# Patient Record
Sex: Male | Born: 1970
Health system: Southern US, Community
[De-identification: ages and names within clinical notes are randomized; demographics above are authoritative.]

## PROBLEM LIST (undated history)

## (undated) DIAGNOSIS — N281 Cyst of kidney, acquired: Secondary | ICD-10-CM

## (undated) DIAGNOSIS — IMO0001 Reserved for inherently not codable concepts without codable children: Secondary | ICD-10-CM

## (undated) DIAGNOSIS — N209 Urinary calculus, unspecified: Secondary | ICD-10-CM

## (undated) DIAGNOSIS — E785 Hyperlipidemia, unspecified: Secondary | ICD-10-CM

## (undated) DIAGNOSIS — K219 Gastro-esophageal reflux disease without esophagitis: Secondary | ICD-10-CM

## (undated) HISTORY — PX: COLONOSCOPY: SHX174

## (undated) HISTORY — DX: Hyperlipidemia, unspecified: E78.5

## (undated) HISTORY — DX: Cyst of kidney, acquired: N28.1

## (undated) HISTORY — PX: UPPER GASTROINTESTINAL ENDOSCOPY: SHX188

## (undated) HISTORY — DX: Urinary calculus, unspecified: N20.9

## (undated) HISTORY — DX: Reserved for inherently not codable concepts without codable children: IMO0001

## (undated) HISTORY — DX: Gastro-esophageal reflux disease without esophagitis: K21.9

---

## 2002-05-12 DIAGNOSIS — I10 Essential (primary) hypertension: Secondary | ICD-10-CM | POA: Insufficient documentation

## 2002-05-12 DIAGNOSIS — E1165 Type 2 diabetes mellitus with hyperglycemia: Secondary | ICD-10-CM

## 2006-09-15 ENCOUNTER — Ambulatory Visit: Payer: Self-pay | Admitting: Internal Medicine

## 2006-09-15 LAB — CONVERTED CEMR LAB
Albumin: 3.9 g/dL (ref 3.5–5.2)
Basophils Absolute: 0.1 10*3/uL (ref 0.0–0.1)
Chloride: 104 meq/L (ref 96–112)
Creatinine,U: 25 mg/dL
Eosinophils Absolute: 0.2 10*3/uL (ref 0.0–0.6)
GFR calc Af Amer: 141 mL/min
GFR calc non Af Amer: 117 mL/min
HCT: 43.8 % (ref 39.0–52.0)
Hgb A1c MFr Bld: 6.9 % — ABNORMAL HIGH (ref 4.6–6.0)
MCHC: 34.6 g/dL (ref 30.0–36.0)
MCV: 86.7 fL (ref 78.0–100.0)
Microalb Creat Ratio: 80 mg/g — ABNORMAL HIGH (ref 0.0–30.0)
Microalb, Ur: 2 mg/dL — ABNORMAL HIGH (ref 0.0–1.9)
Monocytes Absolute: 0.6 10*3/uL (ref 0.2–0.7)
Neutrophils Relative %: 50.7 % (ref 43.0–77.0)
Potassium: 4.5 meq/L (ref 3.5–5.1)
RBC: 5.06 M/uL (ref 4.22–5.81)
Sodium: 141 meq/L (ref 135–145)

## 2006-09-17 DIAGNOSIS — E785 Hyperlipidemia, unspecified: Secondary | ICD-10-CM | POA: Insufficient documentation

## 2006-10-06 ENCOUNTER — Encounter: Payer: Self-pay | Admitting: Internal Medicine

## 2006-10-06 ENCOUNTER — Ambulatory Visit: Payer: Self-pay | Admitting: Internal Medicine

## 2006-10-08 ENCOUNTER — Encounter: Payer: Self-pay | Admitting: Internal Medicine

## 2006-10-08 ENCOUNTER — Ambulatory Visit: Payer: Self-pay

## 2006-10-14 ENCOUNTER — Encounter (INDEPENDENT_AMBULATORY_CARE_PROVIDER_SITE_OTHER): Payer: Self-pay | Admitting: *Deleted

## 2007-01-04 ENCOUNTER — Encounter (INDEPENDENT_AMBULATORY_CARE_PROVIDER_SITE_OTHER): Payer: Self-pay | Admitting: *Deleted

## 2007-07-29 ENCOUNTER — Ambulatory Visit: Payer: Self-pay | Admitting: Internal Medicine

## 2007-08-02 LAB — CONVERTED CEMR LAB
Chloride: 103 meq/L (ref 96–112)
Cholesterol: 219 mg/dL (ref 0–200)
Creatinine, Ser: 0.9 mg/dL (ref 0.4–1.5)
Direct LDL: 162.6 mg/dL
Glucose, Bld: 118 mg/dL — ABNORMAL HIGH (ref 70–99)
Hgb A1c MFr Bld: 6.4 % — ABNORMAL HIGH (ref 4.6–6.0)
Microalb Creat Ratio: 12.2 mg/g (ref 0.0–30.0)
Sodium: 138 meq/L (ref 135–145)

## 2007-11-25 ENCOUNTER — Encounter (INDEPENDENT_AMBULATORY_CARE_PROVIDER_SITE_OTHER): Payer: Self-pay | Admitting: *Deleted

## 2009-11-05 ENCOUNTER — Ambulatory Visit: Payer: Self-pay | Admitting: Internal Medicine

## 2009-11-08 ENCOUNTER — Ambulatory Visit: Payer: Self-pay | Admitting: Internal Medicine

## 2009-11-08 LAB — CONVERTED CEMR LAB
AST: 33 units/L (ref 0–37)
Basophils Absolute: 0 10*3/uL (ref 0.0–0.1)
CO2: 29 meq/L (ref 19–32)
Calcium: 9.1 mg/dL (ref 8.4–10.5)
Chloride: 100 meq/L (ref 96–112)
Cholesterol: 258 mg/dL — ABNORMAL HIGH (ref 0–200)
Creatinine,U: 190.8 mg/dL
Direct LDL: 175.8 mg/dL
Eosinophils Relative: 2.5 % (ref 0.0–5.0)
Glucose, Bld: 144 mg/dL — ABNORMAL HIGH (ref 70–99)
HCT: 42 % (ref 39.0–52.0)
Hgb A1c MFr Bld: 8.2 % — ABNORMAL HIGH (ref 4.6–6.5)
Lymphocytes Relative: 39.2 % (ref 12.0–46.0)
Lymphs Abs: 2.6 10*3/uL (ref 0.7–4.0)
Microalb Creat Ratio: 1.9 mg/g (ref 0.0–30.0)
Monocytes Relative: 8.1 % (ref 3.0–12.0)
Platelets: 283 10*3/uL (ref 150.0–400.0)
Potassium: 4.4 meq/L (ref 3.5–5.1)
Sodium: 137 meq/L (ref 135–145)
TSH: 3.36 microintl units/mL (ref 0.35–5.50)
Triglycerides: 182 mg/dL — ABNORMAL HIGH (ref 0.0–149.0)
WBC: 6.6 10*3/uL (ref 4.5–10.5)

## 2009-11-09 ENCOUNTER — Telehealth: Payer: Self-pay | Admitting: Internal Medicine

## 2010-01-10 ENCOUNTER — Telehealth (INDEPENDENT_AMBULATORY_CARE_PROVIDER_SITE_OTHER): Payer: Self-pay | Admitting: *Deleted

## 2010-01-17 ENCOUNTER — Ambulatory Visit: Payer: Self-pay | Admitting: Internal Medicine

## 2010-01-23 LAB — CONVERTED CEMR LAB
ALT: 38 units/L (ref 0–53)
AST: 32 units/L (ref 0–37)
Direct LDL: 116.5 mg/dL
Hgb A1c MFr Bld: 7.5 % — ABNORMAL HIGH (ref 4.6–6.5)
Total CK: 152 units/L (ref 7–232)

## 2010-05-28 ENCOUNTER — Ambulatory Visit
Admission: RE | Admit: 2010-05-28 | Discharge: 2010-05-28 | Payer: Self-pay | Source: Home / Self Care | Attending: Internal Medicine | Admitting: Internal Medicine

## 2010-06-11 ENCOUNTER — Other Ambulatory Visit: Payer: Self-pay | Admitting: Internal Medicine

## 2010-06-11 ENCOUNTER — Ambulatory Visit
Admission: RE | Admit: 2010-06-11 | Discharge: 2010-06-11 | Payer: Self-pay | Source: Home / Self Care | Attending: Internal Medicine | Admitting: Internal Medicine

## 2010-06-11 DIAGNOSIS — K1120 Sialoadenitis, unspecified: Secondary | ICD-10-CM | POA: Insufficient documentation

## 2010-06-11 LAB — BASIC METABOLIC PANEL
BUN: 11 mg/dL (ref 6–23)
Chloride: 102 mEq/L (ref 96–112)
GFR: 140.04 mL/min (ref >60.00)
Potassium: 4.7 mEq/L (ref 3.5–5.1)
Sodium: 139 mEq/L (ref 135–145)

## 2010-06-11 LAB — ALT: ALT: 38 U/L (ref 0–53)

## 2010-06-11 LAB — HEMOGLOBIN A1C: Hgb A1c MFr Bld: 7.1 % — ABNORMAL HIGH (ref 4.6–6.5)

## 2010-06-11 LAB — AST: AST: 31 U/L (ref 0–37)

## 2010-06-11 NOTE — Progress Notes (Signed)
Summary: laboratory results(lmom 7/1)  Phone Note Outgoing Call   Summary of Call: otherwise patient -diabetes is not well controlled, start metformin 850 mg one a day for the first week, then increase to one twice a day -his cholesterol is elevated, recommend simvastatin 20 mg one p.o. q.h.s. -return to the office, fasting in 2 months. -call for any side effects -definitely needs to work on his diet and exercise as discussed Faustine Tates E. Laquisha Northcraft MD  November 09, 2009 4:43 PM   Follow-up for Phone Call        Left message for pt on both home and cell phone number. sign   Additional Follow-up for Phone Call Additional follow up Details #1::        Spoke with pt, he is aware of results. Meds sent in [facility] CMA  November 13, 2009 12:02 PM     New/Updated Medications: METFORMIN HCL 850 MG TABS (METFORMIN HCL) one a day for the first week, then increase to one twice a day ZOCOR 20 MG TABS (SIMVASTATIN) 1 by mouth daily at bedtime. Prescriptions: ZOCOR 20 MG TABS (SIMVASTATIN) 1 by mouth daily at bedtime.  #30 x 1   Entered by:   Army Fossa CMA   Authorized by:   Nolon Rod. Mylan Schwarz MD   Signed by:   Army Fossa CMA on 11/13/2009   Method used:   Electronically to        [facility] [address] (retail)       [phone]       [place], [place]  [address]       Ph: [phone]       Fax: [phone]   RxID:   [id number] METFORMIN HCL 850 MG TABS (METFORMIN HCL) one a day for the first week, then increase to one twice a day  #60 x 0   Entered by:   Army Fossa CMA   Authorized by:   Nolon Rod. Homero Hyson MD   Signed by:   Army Fossa CMA on 11/13/2009   Method used:   Electronically to        [facility] [address] (retail)       [phone]       [place], [place]  [address]       Ph: [phone]       Fax: [phone]   RxID:   [id number]

## 2010-06-11 NOTE — Progress Notes (Signed)
Summary: pt has appt [id number]  Phone Note Outgoing Call   Call placed by: Army Fossa CMA,  January 10, 2010 7:42 AM Summary of Call: Please call pt, pt needs a fasting OV with dr. Army Fossa CMA  January 10, 2010 7:42 AM   Follow-up for Phone Call        patient scheduled appt [id number] - patient aware he is to be fasting .Marland KitchenOkey Regal [place]  January 10, 2010 9:19 AM

## 2010-06-11 NOTE — Assessment & Plan Note (Signed)
Summary: rto /cbs   Vital Signs:  Patient profile:   40 year old male Weight:      164.50 pounds Pulse rate:   80 / minute Pulse rhythm:   regular BP sitting:   124 / 76  (left arm) Cuff size:   regular  Vitals Entered By: Army Fossa CMA (January 17, 2010 9:24 AM) CC: Follow up vist, fasting Comments Pharm  [facility] [address].    History of Present Illness: 2 months ago we started him on metformin and simvastatin, here for followup  Current Medications (verified): 1)  Lancets and Strips For Glucometer .... Dx Aodm Needs To Check Once A Day Brand of Patient's Choice 2)  Metformin Hcl 850 Mg Tabs (Metformin Hcl) .... One A Day For The First Week, Then Increase To One Twice A Day 3)  Zocor 20 Mg Tabs (Simvastatin) .Marland Kitchen.. 1 By Mouth Daily At Bedtime.  Allergies (verified): No Known Drug Allergies  Past History:  Past Medical History: Reviewed history from 09/17/2006 and no changes required. Diabetes mellitus, type II (05/12/2002) Hypertension (05/12/2002) Hyperlipidemia   Past Surgical History: Reviewed history from 09/17/2006 and no changes required. Denies surgical history  Review of Systems       good medication compliance Reports lower extremity myalgias at nighttime since he started the medicines and a CBGs Gery Pray is from 80-150, mostly about 90 Denies nausea, vomiting, he had diarrhea for a couple of days but that is resolved Complaining off occasional fatigue, "low energy", not having actual symptoms of hypoglycemia like near fainting-sweating-nausea. his diet has improved somehow Exercising very little  Physical Exam  General:  alert and well-developed.   Lungs:  normal respiratory effort, no intercostal retractions, no accessory muscle use, and normal breath sounds.   Heart:  normal rate, regular rhythm, no murmur, and no gallop.   Msk:  no tender to palpation at the major muscle  groups Extremities:  no edema in the lower extremity   Impression &  Recommendations:  Problem # 1:  DIABETES MELLITUS, TYPE II (ICD-250.00) diet-exercise encouraged  start metformin 2 months ago, seems to be tolerating well, no episodes of hypoglycemia. Unclear if " low energy" is related to treatment His updated medication list for this problem includes:    Metformin Hcl 850 Mg Tabs (Metformin hcl) ..... One a day for the first week, then increase to one twice a day    Aspirin 81 Mg Tbec (Aspirin) ..... One tablet daily  Orders: TLB-A1C / Hgb A1C (Glycohemoglobin) (83036-A1C) Specimen Handling (45409) Specimen Handling (81191)  Problem # 2:  HYPERLIPIDEMIA (ICD-272.4)  complaining  of lower extremity myalgias since he is started Zocor Labs Decrease Zocor to 10 mg , but will discontinue it if he continued with aches His updated medication list for this problem includes:    Simvastatin 10 Mg Tabs (Simvastatin) ..... One tablet daily  Labs Reviewed: SGOT: 33 (11/08/2009)   SGPT: 37 (11/08/2009)   HDL:40.20 (11/08/2009), 31.8 (07/29/2007)  LDL:DEL (07/29/2007)  Chol:258 (11/08/2009), 219 (07/29/2007)  Trig:182.0 (11/08/2009), 175 (07/29/2007)  Orders: Venipuncture (47829) TLB-ALT (SGPT) (84460-ALT) TLB-AST (SGOT) (84450-SGOT) TLB-Lipid Panel (80061-LIPID) TLB-CK Total Only(Creatine Kinase/CPK) (82550-CK) Specimen Handling (56213)  Complete Medication List: 1)  Lancets and Strips For Glucometer  .... Dx aodm needs to check once a day brand of patient's choice 2)  Metformin Hcl 850 Mg Tabs (Metformin hcl) .... One a day for the first week, then increase to one twice a day 3)  Simvastatin 10 Mg Tabs (Simvastatin) .Marland KitchenMarland KitchenMarland Kitchen  One tablet daily 4)  Aspirin 81 Mg Tbec (Aspirin) .... One tablet daily  Other Orders: Admin 1st Vaccine (38756) Flu Vaccine 91yrs + [phone])  Patient Instructions: 1)  decrease simvastatin to 10 mg daily 2)  If you still have muscle aches then discontinue it completely 3)  Please come back in 3 months, fasting for a regular  checkup Prescriptions: METFORMIN HCL 850 MG TABS (METFORMIN HCL) one a day for the first week, then increase to one twice a day  #60 Each x 6   Entered and Authorized by:   Elita Quick E. Paz MD   Signed by:   Nolon Rod. Paz MD on 01/17/2010   Method used:   Print then Give to Patient   RxID:   [id number] SIMVASTATIN 10 MG TABS (SIMVASTATIN) one tablet daily  #30 x 6   Entered and Authorized by:   Nolon Rod. Paz MD   Signed by:   Nolon Rod. Paz MD on 01/17/2010   Method used:   Print then Give to Patient   RxID:   [id number]    Flu Vaccine Consent Questions     Do you have a history of severe allergic reactions to this vaccine? no    Any prior history of allergic reactions to egg and/or gelatin? no    Do you have a sensitivity to the preservative Thimersol? no    Do you have a past history of Guillan-Barre Syndrome? no    Do you currently have an acute febrile illness? no    Have you ever had a severe reaction to latex? no    Vaccine information given and explained to patient? yes    Are you currently pregnant? no    Lot Number:[id number]   Exp Date:11/09/2010   Site Given  Left Deltoid IMu

## 2010-06-11 NOTE — Assessment & Plan Note (Signed)
Summary: cpx/cbs   Vital Signs:  Patient profile:   40 year old male Height:      66 inches Weight:      162 pounds BMI:     26.24 Temp:     98.3 degrees F oral Pulse rate:   72 / minute BP sitting:   90 / 80  (left arm)  Vitals Entered By: Jeremy Johann CMA (November 05, 2009 12:45 PM) CC: cpx Comments not fasting REVIEWED MED LIST, PATIENT AGREED   History of Present Illness: CPX not seen since 2009, he has been stable, feels well, they had a baby last year so he has been busy.  Allergies (verified): No Known Drug Allergies  Past History:  Past Medical History: Reviewed history from 09/17/2006 and no changes required. Diabetes mellitus, type II (05/12/2002) Hypertension (05/12/2002) Hyperlipidemia   Past Surgical History: Reviewed history from 09/17/2006 and no changes required. Denies surgical history  Family History: breast Ca --M deceased colon ca--no prostate ca--no lung ca-- GF DM--no MI--no  Social History: Married born in [place]  children x 2  (2004, 2010) tobacco--  no ETOH-- sometimes diet-- reying to eat healthy , except the last 6 months  exercise-- no much lately  works at [employer]  Review of [employer]:  Denies fever; some fatigue . CV:  Denies chest pain or discomfort, palpitations, and swelling of feet. Resp:  Denies cough and shortness of breath. GI:  Denies bloody stools, diarrhea, nausea, and vomiting. GU:  Denies hematuria and urinary hesitancy. Endo:  Denies excessive hunger, excessive thirst, and weight change; no ambulatory CBGs recently .  Physical Exam  General:  alert, well-developed, and well-nourished.   Neck:  no masses and no thyromegaly.   Lungs:  normal respiratory effort, no intercostal retractions, no accessory muscle use, and normal breath sounds.   Heart:  normal rate, regular rhythm, no murmur, and no gallop.   Abdomen:  soft, non-tender, normal bowel sounds, no distention, no masses, no guarding, and no  rigidity.   Pulses:  normal pedal pulses Extremities:  no lower extremity edema  Diabetes Management Exam:    Foot Exam (with socks and/or shoes not present):       Sensory-Pinprick/Light touch:          Left medial foot (L-4): normal          Left dorsal foot (L-5): normal          Left lateral foot (S-1): normal          Right medial foot (L-4): normal          Right dorsal foot (L-5): normal          Right lateral foot (S-1): normal       Sensory-Monofilament:          Left foot: normal          Right foot: normal       Inspection:          Left foot: normal          Right foot: normal       Nails:          Left foot: normal          Right foot: normal   Impression & Recommendations:  Problem # 1:  ROUTINE GENERAL MEDICAL EXAM@HEALTH  CARE FACL (ICD-V70.0) Td 08 Discuss diet and exercise Labs  Problem # 2:  HYPERLIPIDEMIA (ICD-272.4) on diet only, labs Labs Reviewed: SGOT: 32 (09/15/2006)  SGPT: 36 (09/15/2006)   HDL:31.8 (07/29/2007)  LDL:DEL (07/29/2007)  Chol:219 (07/29/2007)  Trig:175 (07/29/2007)  Problem # 3:  HYPERTENSION (ICD-401.9) hypertension? He takes no medication, his BP has been consistently normal BP today: 90/80 Prior BP: 100/60 (07/29/2007)  Labs Reviewed: K+: 4.3 (07/29/2007) Creat: : 0.9 (07/29/2007)   Chol: 219 (07/29/2007)   HDL: 31.8 (07/29/2007)   LDL: DEL (07/29/2007)   TG: 175 (07/29/2007)  Problem # 4:  DIABETES MELLITUS, TYPE II (ICD-250.00) lifestyle has not been as good as before in the last few months to have an eye exam next week Labs Labs Reviewed: Creat: 0.9 (07/29/2007)    Reviewed HgBA1c results: 6.4 (07/29/2007)  6.9 (09/15/2006)  Complete Medication List: 1)  Lancets and Strips For Glucometer  .... Dx aodm needs to check once a day brand of patient's choice  Patient Instructions: 1)  please came back fasting: 2)  FLP---dx  hyperlipidemia 3)  A1c, microalbumin ---- dx diabetes 4)  CBC, BMP, AST, ALT, TSH----v70 5)   Please schedule a follow-up appointment in 4 months .  Prescriptions: LANCETS AND STRIPS FOR GLUCOMETER Dx AODM needs to check once a day Brand of patient's choice  #67mo x 3   Entered and Authorized by:   Nolon Rod. Paz MD   Signed by:   Nolon Rod. Paz MD on 11/05/2009   Method used:   Print then Give to Patient   RxID:   [id number]

## 2010-06-13 NOTE — Assessment & Plan Note (Signed)
Summary: cough x 2 weeks//lch   Vital Signs:  Patient profile:   40 year old male Weight:      161.13 pounds Temp:     98.7 degrees F oral Pulse rate:   68 / minute Pulse rhythm:   regular BP sitting:   126 / 84  (left arm) Cuff size:   regular  Vitals Entered By: Army Fossa CMA (May 28, 2010 8:12 AM) CC: Pt here c/o cough x 2 weeks.  Comments some head congestion taking cough meds [facility] [address]    History of Present Illness: cough, chest and sinus congestion for more than 2 weeks has some yellow sputum currently the chest is more congested than the  sinuses  Current Medications (verified): 1)  Lancets and Strips For Glucometer .... Dx Aodm Needs To Check Once A Day Brand of Patient's Choice 2)  Metformin Hcl 850 Mg Tabs (Metformin Hcl) .... One A Day For The First Week, Then Increase To One Twice A Day 3)  Simvastatin 10 Mg Tabs (Simvastatin) .... One Tablet Daily 4)  Aspirin 81 Mg Tbec (Aspirin) .... One Tablet Daily  Allergies (verified): No Known Drug Allergies  Past History:  Past Medical History: Reviewed history from 09/17/2006 and no changes required. Diabetes mellitus, type II (05/12/2002) Hypertension (05/12/2002) Hyperlipidemia   Past Surgical History: Reviewed history from 09/17/2006 and no changes required. Denies surgical history  Social History: Reviewed history from 11/05/2009 and no changes required. Married born in [place]  children x 2  (2004, 2010) tobacco--  no ETOH-- sometimes diet-- reying to eat healthy , except the last 6 months  exercise-- no much lately  works at [employer]  Review of Systems General:  Denies fever. Resp:  Denies shortness of breath; some wheezing, chest congestion. GI:  Denies diarrhea, nausea, and vomiting. MS:  Denies muscle aches.  Physical Exam  General:  alert, well-developed, and well-nourished.   Head:  face symmetric, not tender to palpation Ears:  R ear normal and L ear normal.   Nose:   not congested Mouth:  no redness or discharge, tonsils normal in size Lungs:  normal respiratory effort and no intercostal retractions.  no wheezing, does have rhonchi and large airway congestion with cough. Heart:  normal rate, regular rhythm, no murmur, and no gallop.     Impression & Recommendations:  Problem # 1:  BRONCHITIS- ACUTE (ICD-466.0) see instructions  His updated medication list for this problem includes:    Zithromax Z-pak 250 Mg Tabs (Azithromycin) .Marland Kitchen... As directed  Problem # 2:  due for an ROV. Patient aware  Complete Medication List: 1)  Lancets and Strips For Glucometer  .... Dx aodm needs to check once a day brand of patient's choice 2)  Metformin Hcl 850 Mg Tabs (Metformin hcl) .... One a day for the first week, then increase to one twice a day 3)  Simvastatin 10 Mg Tabs (Simvastatin) .... One tablet daily 4)  Aspirin 81 Mg Tbec (Aspirin) .... One tablet daily 5)  Zithromax Z-pak 250 Mg Tabs (Azithromycin) .... As directed  Patient Instructions: 1)  rest, fluids 2)  Mucinex DM OTC 1 tablet twice a day x 10 days, then as needed 3)  zpack 4)  call if not back to normal in 2 weeks 5)  call if you get worse  Prescriptions: ZITHROMAX Z-PAK 250 MG TABS (AZITHROMYCIN) as directed  #1 x 0   Entered and Authorized by:   Nolon Rod. Hokulani Rogel MD   Signed by:  Divon Krabill E. Gregory Dowe MD on 05/28/2010   Method used:   Print then Give to Patient   RxID:   [id number]    Orders Added: 1)  Est. Patient Level III [56213]

## 2010-06-18 ENCOUNTER — Ambulatory Visit (INDEPENDENT_AMBULATORY_CARE_PROVIDER_SITE_OTHER): Payer: BC Managed Care – PPO | Admitting: Internal Medicine

## 2010-06-18 ENCOUNTER — Encounter: Payer: Self-pay | Admitting: Internal Medicine

## 2010-06-18 ENCOUNTER — Telehealth: Payer: Self-pay | Admitting: Internal Medicine

## 2010-06-18 DIAGNOSIS — K1120 Sialoadenitis, unspecified: Secondary | ICD-10-CM

## 2010-06-18 DIAGNOSIS — R509 Fever, unspecified: Secondary | ICD-10-CM | POA: Insufficient documentation

## 2010-06-18 LAB — CONVERTED CEMR LAB
Bilirubin Urine: NEGATIVE
Ketones, urine, test strip: NEGATIVE
Specific Gravity, Urine: 1.02
Urobilinogen, UA: 0.2

## 2010-06-19 ENCOUNTER — Ambulatory Visit: Payer: Self-pay | Admitting: Internal Medicine

## 2010-06-19 NOTE — Assessment & Plan Note (Signed)
Summary: GLAND PAIN//PH   Vital Signs:  Patient profile:   40 year old male Weight:      162.50 pounds Temp:     98.2 degrees F oral Pulse rate:   78 / minute Pulse rhythm:   regular BP sitting:   126 / 86  (left arm) Cuff size:   large  Vitals Entered By: Army Fossa CMA (June 11, 2010 11:15 AM) CC: Pt here swollen gland on (L) side of neck. Comments started sunday painful [facility] [address]    History of Present Illness: 3 days history of left parotid swelling and pain  ROS Mild subjective fever No dental pain or recent dental work He was seen with bronchitis a few days ago, overall feels better, he has some remaining stuffy nose. Denies sore throat No ear pain or ear discharge No testicular pain  Current Medications (verified): 1)  Lancets and Strips For Glucometer .... Dx Aodm Needs To Check Once A Day Brand of Patient's Choice 2)  Metformin Hcl 850 Mg Tabs (Metformin Hcl) .... One A Day For The First Week, Then Increase To One Twice A Day 3)  Simvastatin 10 Mg Tabs (Simvastatin) .... One Tablet Daily 4)  Aspirin 81 Mg Tbec (Aspirin) .... One Tablet Daily  Allergies (verified): No Known Drug Allergies  Past History:  Past Medical History: Reviewed history from 09/17/2006 and no changes required. Diabetes mellitus, type II (05/12/2002) Hypertension (05/12/2002) Hyperlipidemia   Past Surgical History: Reviewed history from 09/17/2006 and no changes required. Denies surgical history  Social History: Reviewed history from 11/05/2009 and no changes required. Married born in [place]  children x 2  (2004, 2010) tobacco--  no ETOH-- sometimes diet-- reying to eat healthy , except the last 6 months  exercise-- no much lately  works at [employer]  Physical Exam  General:  alert, well-developed, and well-nourished.   Head:  right parotid gland normal Left parotid gland slightly swollen, moderately tender, slightly warm. no redness or fluctuancy. Ears:  R  ear normal and L ear normal.  no tender at the mastoid area on either side Mouth:  percussion of the teeth caused no pain, palpation of the gum and the base of the mouth showed no abscess Neck:  full range of motion, no LADs   Impression & Recommendations:  Problem # 1:  PAROTITIS, LEFT (ICD-527.2) symptoms consistent with parotitis he tried  warm compresses and it didn't help. See instructions  Problem # 2:  HYPERTENSION (ICD-401.9) labs  Orders: Venipuncture (91478) TLB-BMP (Basic Metabolic Panel-BMET) (80048-METABOL) Specimen Handling (29562)  BP today: 126/86 Prior BP: 126/84 (05/28/2010)  Labs Reviewed: K+: 4.4 (11/08/2009) Creat: : 0.7 (11/08/2009)   Chol: 179 (01/17/2010)   HDL: 37.20 (01/17/2010)   LDL: DEL (07/29/2007)   TG: 257.0 (01/17/2010)  Problem # 3:  DIABETES MELLITUS, TYPE II (ICD-250.00) labs  His updated medication list for this problem includes:    Metformin Hcl 850 Mg Tabs (Metformin hcl) ..... One a day for the first week, then increase to one twice a day    Aspirin 81 Mg Tbec (Aspirin) ..... One tablet daily  Orders: TLB-A1C / Hgb A1C (Glycohemoglobin) (83036-A1C) TLB-ALT (SGPT) (84460-ALT) TLB-AST (SGOT) (84450-SGOT) Specimen Handling (13086)  Labs Reviewed: Creat: 0.7 (11/08/2009)    Reviewed HgBA1c results: 7.5 (01/17/2010)  8.2 (11/08/2009)  Complete Medication List: 1)  Lancets and Strips For Glucometer  .... Dx aodm needs to check once a day brand of patient's choice 2)  Metformin Hcl 850 Mg Tabs (  Metformin hcl) .... One a day for the first week, then increase to one twice a day 3)  Simvastatin 10 Mg Tabs (Simvastatin) .... One tablet daily 4)  Aspirin 81 Mg Tbec (Aspirin) .... One tablet daily 5)  Amoxicillin 500 Mg Tabs (Amoxicillin) .... 2 tablets twice a day for one week  Patient Instructions: 1)  cold compresses 2)  Motrin 200 mg over-the-counter. 2 or 3 tablets 3 times a day. Take with food. 3)  Amoxicillin as described 4)  lime  juice two times a day  5)  Call immediately if the swelling gets worse or if you have high fever or testicular pain 6)  come back in 3 months for your routine checkup Prescriptions: AMOXICILLIN 500 MG TABS (AMOXICILLIN) 2 tablets twice a day for one week  #28 x 0   Entered and Authorized by:   Nolon Rod. Paz MD   Signed by:   Nolon Rod. Paz MD on 06/11/2010   Method used:   Print then Give to Patient   RxID:   [id number]    Orders Added: 1)  Venipuncture [36415] 2)  TLB-A1C / Hgb A1C (Glycohemoglobin) [83036-A1C] 3)  TLB-BMP (Basic Metabolic Panel-BMET) [80048-METABOL] 4)  TLB-ALT (SGPT) [84460-ALT] 5)  TLB-AST (SGOT) [84450-SGOT] 6)  Specimen Handling [99000] 7)  Est. Patient Level III [30865]

## 2010-06-27 NOTE — Assessment & Plan Note (Signed)
Summary: per md  Nurse Visit   Vital Signs:  Patient profile:   40 year old male Weight:      160.13 pounds Temp:     100.0 degrees F oral Pulse rate:   88 / minute Pulse rhythm:   regular BP sitting:   126 / 80  (left arm) Cuff size:   large  Vitals Entered By: Army Fossa CMA (June 18, 2010 2:02 PM)  History of Present Illness: here with his wife He was seen last week with parotitis, prescribed antibiotics. He gradually improved. today in the early morning hours he developed fever, he was not feeling well, temperature was up to 103.0. He is taking acetaminophen. He has no other  specific sx , just not feeling well w/ the fever (no HA, some   aches)  ROS No runny nose or sore throat No cough, mild sinus congestion No rash No nausea, vomiting, diarrhea No dysuria or gross hematuria, no testicular pain or swelling   Physical Exam  General:  alert, well-developed, and well-nourished.   Head:  right parotid gland normal Left parotid gland less swollen than before,no  tender, no  warm. no redness or fluctuancy. face is otherwise symmetric and nontender to palpation at the maxillary sinuses Ears:  R ear normal and L ear normal.   Mouth:  no redness Lungs:  normal respiratory effort, no intercostal retractions, no accessory muscle use, and normal breath sounds.   Heart:  normal rate, regular rhythm, and no murmur.   Abdomen:  soft, non-tender, no distention, and no masses.   Genitalia:  uncircumcised, no hydrocele, no varicocele, no scrotal masses, and no testicular masses or atrophy.  no testicular pain Extremities:  no edema in the lower extremity   Impression & Recommendations:  Problem # 1:  PAROTITIS, LEFT (ICD-527.2) improving, curetted finish amoxicillin  Problem # 2:  FEVER UNSPECIFIED (ICD-780.60) fever that started earlier today There is no specific symptoms in the review of systems He was recently diagnosed with parotitis, Testicular exam is  normal he was on antibiotics, the fever could be an antibiotic reaction but  he lacks other  symptoms  related to allergies or intolerance Urinalysis negative  Plan:  Rest, fluids, Tylenol. observation  see instructions   Complete Medication List: 1)  Lancets and Strips For Glucometer  .... Dx aodm needs to check once a day brand of patient's choice 2)  Metformin Hcl 850 Mg Tabs (Metformin hcl) .... 1.5 by mouth two times a day. 3)  Simvastatin 10 Mg Tabs (Simvastatin) .... One tablet daily 4)  Aspirin 81 Mg Tbec (Aspirin) .... One tablet daily   Patient Instructions: 1)  Rest, fluids, Tylenol 2)  call if not back normal 3 days 3)  call  any time if the fever is high, you have a severe headache, rash or other symptoms  CC: Fever Comments Yesterday was 103.0 Taking tylenol diarrhea x 3 days [facility] [address]    Current Medications (verified): 1)  Lancets and Strips For Glucometer .... Dx Aodm Needs To Check Once A Day Brand of Patient's Choice 2)  Metformin Hcl 850 Mg Tabs (Metformin Hcl) .... 1.5 By Mouth Two Times A Day. 3)  Simvastatin 10 Mg Tabs (Simvastatin) .... One Tablet Daily 4)  Aspirin 81 Mg Tbec (Aspirin) .... One Tablet Daily  Allergies (verified): No Known Drug Allergies Laboratory Results   Urine Tests    Routine Urinalysis   Color: yellow Appearance: Clear Glucose: negative   (Normal  Range: Negative) Bilirubin: negative   (Normal Range: Negative) Ketone: negative   (Normal Range: Negative) Spec. Gravity: 1.020   (Normal Range: 1.003-1.035) Blood: negative   (Normal Range: Negative) pH: 7.0   (Normal Range: 5.0-8.0) Protein: negative   (Normal Range: Negative) Urobilinogen: 0.2   (Normal Range: 0-1) Nitrite: negative   (Normal Range: Negative) Leukocyte Esterace: negative   (Normal Range: Negative)    Comments: Army Fossa CMA  June 18, 2010 2:47 PM     Orders Added: 1)  Est. Patient Level III [16109]

## 2010-06-27 NOTE — Progress Notes (Signed)
Summary: Continued fever and swelling  2nd call  Phone Note Call from Patient Call back at Home Phone [phone]   Caller: Spouse Summary of Call: Patient spouse called noting that the patient is not better and continues to have swelling and fever. His fever is 101. I told her to have the patient stay at home until he hears back from our office. Please advise.  Initial call taken by: Lucious Groves CMA,  June 18, 2010 8:19 AM  Follow-up for Phone Call        Patient spouse called to notify that patient continues to climb. She states that it is now 101.8. I made her awar that I would bring it to the doctors attn and call back with his recommendations. She questions if she should the the patient to urgent care, I made her aware that she can if she would like, but I will get back with her as soon as MD can give me his recommendations. Please advise. Lucious Groves CMA  June 18, 2010 11:11 AM   Additional Follow-up for Phone Call Additional follow up Details #1::        could he came back this afternoon? early if possible Jose E. Paz MD  June 18, 2010 1:03 PM     Additional Follow-up for Phone Call Additional follow up Details #2::    Pt is coming this afternoon. Army Fossa CMA  June 18, 2010 1:17 PM

## 2010-06-28 ENCOUNTER — Telehealth: Payer: Self-pay | Admitting: Internal Medicine

## 2010-07-03 NOTE — Progress Notes (Signed)
Summary: pt status   ---- Converted from flag ---- ---- 06/25/2010 3:54 PM, Army Fossa CMA wrote: Left message for pt to call back.    ---- 06/21/2010 3:55 PM, Army Fossa CMA wrote:  Left message for pt to call back.  ---- 06/19/2010 5:17 PM, Margee Trentham E. Seve Monette MD wrote: check on patient ------------------------------  pt never called back. Army Fossa CMA  June 28, 2010 4:38 PM

## 2010-09-10 ENCOUNTER — Other Ambulatory Visit: Payer: Self-pay | Admitting: Internal Medicine

## 2010-11-18 ENCOUNTER — Other Ambulatory Visit: Payer: Self-pay | Admitting: Internal Medicine

## 2010-11-27 ENCOUNTER — Other Ambulatory Visit: Payer: Self-pay | Admitting: Internal Medicine

## 2010-11-27 MED ORDER — METFORMIN HCL 850 MG PO TABS: ORAL_TABLET | ORAL | Status: DC

## 2010-11-27 NOTE — Telephone Encounter (Signed)
Sent in

## 2010-12-22 ENCOUNTER — Other Ambulatory Visit: Payer: Self-pay | Admitting: Internal Medicine

## 2011-01-23 ENCOUNTER — Other Ambulatory Visit: Payer: Self-pay | Admitting: Internal Medicine

## 2011-01-23 NOTE — Telephone Encounter (Signed)
Rx Done . 

## 2011-02-10 DIAGNOSIS — IMO0001 Reserved for inherently not codable concepts without codable children: Secondary | ICD-10-CM

## 2011-02-10 HISTORY — DX: Reserved for inherently not codable concepts without codable children: IMO0001

## 2011-02-25 ENCOUNTER — Encounter: Payer: Self-pay | Admitting: Internal Medicine

## 2011-02-25 ENCOUNTER — Ambulatory Visit (INDEPENDENT_AMBULATORY_CARE_PROVIDER_SITE_OTHER): Payer: BC Managed Care – PPO | Admitting: Internal Medicine

## 2011-02-25 DIAGNOSIS — R079 Chest pain, unspecified: Secondary | ICD-10-CM | POA: Insufficient documentation

## 2011-02-25 DIAGNOSIS — E119 Type 2 diabetes mellitus without complications: Secondary | ICD-10-CM

## 2011-02-25 DIAGNOSIS — E785 Hyperlipidemia, unspecified: Secondary | ICD-10-CM

## 2011-02-25 MED ORDER — NITROGLYCERIN 0.4 MG SL SUBL: 0.4000 mg | SUBLINGUAL_TABLET | SUBLINGUAL | Status: DC | PRN

## 2011-02-25 NOTE — Assessment & Plan Note (Addendum)
40 year old gentleman with history of diabetes high cholesterol presents with atypical chest pain. EKG today nsr, no old EKG Neg stress test 4 years ago Plan: Chest x-ray Stress test ER if symptoms severe Nitroglycerin when necessary Reassess in one month

## 2011-02-25 NOTE — Progress Notes (Signed)
  Subjective:    Patient ID: Shawn Walter, male    DOB: 1970/09/27, 40 y.o.   MRN: [id number]  HPI Chief complaint chest pain Symptoms started about a month ago, they're happening 2 or 3 times a week, usually at rest, no exertional symptoms, located in the left pectoral muscle with some radiation to the back and the proximal left arm. They're not associated with nausea, but question of feeling slt SOB and clamminess. He has diabetes, high cholesterol and has not follow up routinely lately. Blood sugar lately slightly elevated in the 160s.  Past Medical History  Diagnosis Date  . Diabetes mellitus   . HTN (hypertension)   . Hyperlipidemia    No past surgical history on file.  Family History: breast Ca --M deceased colon ca--no prostate ca--no lung ca-- GF DM--no MI--no  Social History: Married born in [place]  children x 2  (2004, 2010) tobacco--  no ETOH-- sometimes diet-- reying to eat healthy , except the last 6 months  exercise-- no much lately  works at [employer]  Review of Systems No fever or chills Had a cold 2 weeks ago, symptoms are essentially resolved. Denies any palpitations No rash in the chest No GERD symptoms are abdominal discomfort. Note recent airplane trips.     Objective:   Physical Exam  Constitutional: He appears well-developed and well-nourished. No distress.  HENT:  Head: Normocephalic and atraumatic.  Cardiovascular: Normal rate, regular rhythm and normal heart sounds.   No murmur heard. Pulmonary/Chest: Effort normal and breath sounds normal. No respiratory distress. He has no wheezes. He has no rales.         Slightly tender to palpation at the area of pain  Skin: He is not diaphoretic.          Assessment & Plan:

## 2011-02-25 NOTE — Patient Instructions (Signed)
Get your XR  Came back fasting:  CBC BMP AST ALT ---DX DM FLP--Dx hyperlipidemia nitroglycerine every 5 min x 3 if chest pain ER if chest pain severe

## 2011-02-25 NOTE — Assessment & Plan Note (Signed)
Due for labs, see instructions  

## 2011-02-25 NOTE — Assessment & Plan Note (Signed)
Labs

## 2011-02-26 ENCOUNTER — Other Ambulatory Visit: Payer: Self-pay | Admitting: Internal Medicine

## 2011-02-26 DIAGNOSIS — E785 Hyperlipidemia, unspecified: Secondary | ICD-10-CM

## 2011-02-26 DIAGNOSIS — E119 Type 2 diabetes mellitus without complications: Secondary | ICD-10-CM

## 2011-02-26 NOTE — Telephone Encounter (Signed)
Done

## 2011-02-27 ENCOUNTER — Other Ambulatory Visit (INDEPENDENT_AMBULATORY_CARE_PROVIDER_SITE_OTHER): Payer: BC Managed Care – PPO

## 2011-02-27 DIAGNOSIS — E119 Type 2 diabetes mellitus without complications: Secondary | ICD-10-CM

## 2011-02-27 DIAGNOSIS — E785 Hyperlipidemia, unspecified: Secondary | ICD-10-CM

## 2011-02-27 LAB — CBC WITH DIFFERENTIAL/PLATELET
Basophils Absolute: 0.1 10*3/uL (ref 0.0–0.1)
HCT: 41.7 % (ref 39.0–52.0)
Hemoglobin: 14.3 g/dL (ref 13.0–17.0)
Lymphs Abs: 2.9 10*3/uL (ref 0.7–4.0)
MCHC: 34.2 g/dL (ref 30.0–36.0)
Monocytes Relative: 7.1 % (ref 3.0–12.0)
Neutro Abs: 4.6 10*3/uL (ref 1.4–7.7)
RDW: 13 % (ref 11.5–14.6)

## 2011-02-27 LAB — AST: AST: 45 U/L — ABNORMAL HIGH (ref 0–37)

## 2011-02-27 LAB — LDL CHOLESTEROL, DIRECT: Direct LDL: 119.7 mg/dL

## 2011-02-27 LAB — BASIC METABOLIC PANEL
CO2: 25 mEq/L (ref 19–32)
Calcium: 8.8 mg/dL (ref 8.4–10.5)
Creatinine, Ser: 1 mg/dL (ref 0.4–1.5)

## 2011-02-27 LAB — LIPID PANEL
Cholesterol: 184 mg/dL (ref 0–200)
Total CHOL/HDL Ratio: 5

## 2011-02-27 NOTE — Progress Notes (Signed)
Labs only

## 2011-03-07 ENCOUNTER — Telehealth: Payer: Self-pay | Admitting: *Deleted

## 2011-03-07 MED ORDER — ATORVASTATIN CALCIUM 20 MG PO TABS: 20.0000 mg | ORAL_TABLET | Freq: Every day | ORAL | Status: DC

## 2011-03-07 NOTE — Telephone Encounter (Signed)
LMOM w/culture results w/contact name & number. Patient requesting Hgb A1C lab [last in EMR 06/11/10]. Please advise.

## 2011-03-07 NOTE — Telephone Encounter (Signed)
Message copied by Regis Bill on Fri Mar 07, 2011  6:06 PM ------      Message from: Willow Ora E      Created: Mon Mar 03, 2011  6:21 PM       Noted that LFTs are slightly elevated      Advise patient:      Needs better cholesterol control---->  discontinue simvastatin 10 mg and start Lipitor 20 mg each bedtime. Call in a new prescription.

## 2011-03-10 NOTE — Telephone Encounter (Signed)
I agree, he needs a  A1c. Please arrange, dx DM

## 2011-03-11 ENCOUNTER — Ambulatory Visit (HOSPITAL_COMMUNITY): Payer: BC Managed Care – PPO | Attending: Internal Medicine | Admitting: Radiology

## 2011-03-11 DIAGNOSIS — R0789 Other chest pain: Secondary | ICD-10-CM

## 2011-03-11 DIAGNOSIS — E119 Type 2 diabetes mellitus without complications: Secondary | ICD-10-CM

## 2011-03-11 DIAGNOSIS — R0609 Other forms of dyspnea: Secondary | ICD-10-CM

## 2011-03-11 DIAGNOSIS — R079 Chest pain, unspecified: Secondary | ICD-10-CM | POA: Insufficient documentation

## 2011-03-11 MED ORDER — TECHNETIUM TC 99M TETROFOSMIN IV KIT
11.0000 | PACK | Freq: Once | INTRAVENOUS | Status: AC | PRN
  Administered 2011-03-11: 11 via INTRAVENOUS

## 2011-03-11 MED ORDER — TECHNETIUM TC 99M TETROFOSMIN IV KIT
33.0000 | PACK | Freq: Once | INTRAVENOUS | Status: AC | PRN
  Administered 2011-03-11: 33 via INTRAVENOUS

## 2011-03-11 NOTE — Progress Notes (Signed)
[facility] SITE 3 NUCLEAR MED [address] [place] [address] [phone]  Cardiology Nuclear Med Study  Edon Pottinger is a 40 y.o. male [id number] 1970/06/20   Nuclear Med Background Indication for Stress Test:  Evaluation for Ischemia History:  No previous documented CAD and5/08 Myocardial Perfusion Study: NL, EF=68% Cardiac Risk Factors: History of Smoking, Hypertension, Lipids and NIDDM  Symptoms:  Chest Pain and Chest Pressure.  (last date of chest discomfort yesterday), DOE, Fatigue and SOB   Nuclear Pre-Procedure Caffeine/Decaff Intake:  None NPO After: 8:00pm   Lungs:  Clear IV 0.9% NS with Angio Cath:  20g  IV Site: R Hand  IV Started by:  Cathlyn Parsons, RN  Chest Size (in):  40 Cup Size: n/a  Height: 5\' 5"  (1.651 m)  Weight:  158 lb (71.668 kg)  BMI:  Body mass index is 26.29 kg/(m^2). Tech Comments:  n/a    Nuclear Med Study 1 or 2 day study: 1 day  Stress Test Type:  Stress  Reading MD: Lars Masson. Order Authorizing Provider:  Willow Ora, MD  Resting Radionuclide: Technetium 67m Tetrofosmin  Resting Radionuclide Dose: 11.0 mCi   Stress Radionuclide:  Technetium 67m Tetrofosmin  Stress Radionuclide Dose: 33.0 mCi           Stress Protocol Rest HR: 70 Stress HR: 181  Rest BP: 121/84 Stress BP: 183/71  Exercise Time (min): 10:01 METS: 11.4   Predicted Max HR: 180 bpm % Max HR: 100.56 bpm Rate Pressure Product: 21308   Dose of Adenosine (mg):  n/a Dose of Lexiscan: n/a mg  Dose of Atropine (mg): n/a Dose of Dobutamine: n/a mcg/kg/min (at max HR)  Stress Test Technologist: Irean Hong, RN  Nuclear Technologist:  Domenic Polite, CNMT     Rest Procedure:  Myocardial perfusion imaging was performed at rest 45 minutes following the intravenous administration of Technetium 37m Tetrofosmin. Rest ECG: NSR, early replorization  Stress Procedure:  The patient exercised for 10 minutes and 01 second, RPE=17.  The patient stopped  due to DOE and denied any chest pain.  There were nonspecific ST-T wave changes. There was a rare PVC. There was a drop in BP 143/65 immediately post exercise. Technetium 47m Tetrofosmin was injected at peak exercise and myocardial perfusion imaging was performed after a brief delay. Stress ECG: No significant ST segment change suggestive of ischemia.  QPS Raw Data Images:  Acquisition technically good; normal left ventricular size. Stress Images:  There is decreased uptake in the distal anterior wall. Rest Images:  There is decreased uptake in the distal anterior wall. Subtraction (SDS):  No evidence of ischemia. Transient Ischemic Dilatation (Normal <1.22):  0.99 Lung/Heart Ratio (Normal <0.45):  0.33  Quantitative Gated Spect Images QGS EDV:  54 ml QGS ESV:  20 ml QGS cine images:  NL LV Function; NL Wall Motion QGS EF: 64%  Impression Exercise Capacity:  Good exercise capacity. BP Response:  Normal blood pressure response. Clinical Symptoms:  There is dyspnea. ECG Impression:  No significant ST segment change suggestive of ischemia. Comparison with Prior Nuclear Study: No images to compare  Overall Impression:  Normal stress nuclear study with mild, small fixed distal anterior defect suggestive of soft tissue attenuation; no ischemia.       Shawn Walter

## 2011-03-13 ENCOUNTER — Telehealth: Payer: Self-pay

## 2011-03-13 NOTE — Telephone Encounter (Signed)
Message copied by Francisco Capuchin on Thu Mar 13, 2011  5:16 PM ------      Message from: Shawn Walter      Created: Thu Mar 13, 2011  1:58 PM       Advise patient:      Stress test normal. If chest pain is severe or persistent let me know otherwise follow up as planned.

## 2011-03-13 NOTE — Telephone Encounter (Signed)
Patient advised that the stress test was normal and if severe or persistent to let MD Drue Novel know and to follow up as planned

## 2011-07-10 ENCOUNTER — Ambulatory Visit (INDEPENDENT_AMBULATORY_CARE_PROVIDER_SITE_OTHER): Payer: BC Managed Care – PPO | Admitting: Family Medicine

## 2011-07-10 ENCOUNTER — Encounter: Payer: Self-pay | Admitting: Family Medicine

## 2011-07-10 VITALS — BP 120/84 | HR 82 | Temp 98.7°F | Ht 65.5 in | Wt 162.8 lb

## 2011-07-10 DIAGNOSIS — K612 Anorectal abscess: Secondary | ICD-10-CM

## 2011-07-10 DIAGNOSIS — K611 Rectal abscess: Secondary | ICD-10-CM | POA: Insufficient documentation

## 2011-07-10 MED ORDER — HYDROCODONE-ACETAMINOPHEN 5-500 MG PO TABS: 1.0000 | ORAL_TABLET | Freq: Three times a day (TID) | ORAL | Status: DC | PRN

## 2011-07-10 MED ORDER — DOXYCYCLINE HYCLATE 100 MG PO TABS: 100.0000 mg | ORAL_TABLET | Freq: Two times a day (BID) | ORAL | Status: AC

## 2011-07-10 NOTE — Patient Instructions (Signed)
This is a peri-rectal abscess Start the Doxycycline twice daily- w/ food Use the vicodin as needed for severe pain Call with any questions or concerns Hang in there!!!

## 2011-07-10 NOTE — Progress Notes (Signed)
  Subjective:    Patient ID: Shawn Walter, male    DOB: [birth year], 41 y.o.   MRN: [id number]  HPI Boil- on L buttock, noticed 3-4 days ago.  Not draining.  Painful.  No hx of similar.  Denies any scratching or known break in skin.   Review of Systems For ROS see HPI     Objective:   Physical Exam  Vitals reviewed. Constitutional: He appears well-developed and well-nourished. No distress.  Genitourinary: Rectal exam shows tenderness (pt w/ 2.5 cm perirectal abscess at L rectal margin).             Assessment & Plan:

## 2011-07-11 ENCOUNTER — Encounter (INDEPENDENT_AMBULATORY_CARE_PROVIDER_SITE_OTHER): Payer: Self-pay | Admitting: General Surgery

## 2011-07-11 ENCOUNTER — Ambulatory Visit (INDEPENDENT_AMBULATORY_CARE_PROVIDER_SITE_OTHER): Payer: BC Managed Care – PPO | Admitting: General Surgery

## 2011-07-11 VITALS — BP 132/94 | HR 76 | Temp 97.6°F | Resp 18 | Ht 65.0 in | Wt 162.2 lb

## 2011-07-11 DIAGNOSIS — K611 Rectal abscess: Secondary | ICD-10-CM

## 2011-07-11 DIAGNOSIS — K612 Anorectal abscess: Secondary | ICD-10-CM

## 2011-07-11 HISTORY — PX: INCISE AND DRAIN ABCESS: PRO64

## 2011-07-11 NOTE — Patient Instructions (Signed)
Remove packing tomorrow Warm tub soaks 2 times a day and after bowel movement Keep clean dry gauze tucked on rectum at all times

## 2011-07-13 NOTE — Assessment & Plan Note (Signed)
New.  Pt w/ large perirectal abscess.  Start abx.  Refer to surgery.  Pain meds prn.  Reviewed supportive care and red flags that should prompt return.  Pt expressed understanding and is in agreement w/ plan.

## 2011-07-15 ENCOUNTER — Encounter (INDEPENDENT_AMBULATORY_CARE_PROVIDER_SITE_OTHER): Payer: Self-pay | Admitting: General Surgery

## 2011-07-15 NOTE — Progress Notes (Signed)
Subjective:     Patient ID: Shawn Walter, male   DOB: [birth year], 41 y.o.   MRN: [id number]  HPI We are asked to see the patient in consultation by Dr. Willow Ora to evaluate him for a perirectal abscess. The patient is a 41 year old [place] gentleman who began having pain in his perirectal area about for 5 days ago. Last night the area opened up and started draining. He denies any fevers but has had some chills the last 2 nights. He was started on doxycycline which has not really affected it at all.  Review of Systems  Constitutional: Positive for chills.  HENT: Negative.   Eyes: Negative.   Respiratory: Negative.   Cardiovascular: Negative.   Gastrointestinal: Positive for rectal pain.  Genitourinary: Negative.   Musculoskeletal: Negative.   Skin: Negative.   Neurological: Negative.   Hematological: Negative.   Psychiatric/Behavioral: Negative.        Objective:   Physical Exam  Constitutional: He is oriented to person, place, and time. He appears well-developed and well-nourished.  HENT:  Head: Normocephalic and atraumatic.  Eyes: Conjunctivae and EOM are normal. Pupils are equal, round, and reactive to light.  Neck: Normal range of motion. Neck supple.  Cardiovascular: Normal rate, regular rhythm and normal heart sounds.   Pulmonary/Chest: Effort normal and breath sounds normal.  Abdominal: Soft. Bowel sounds are normal.  Genitourinary:       He has a small open area anteriorly on the left draining some purulent material.   Musculoskeletal: Normal range of motion.  Neurological: He is alert and oriented to person, place, and time.  Skin: Skin is warm and dry.  Psychiatric: He has a normal mood and affect. His behavior is normal.       Assessment:     Perirectal abscess.    Plan:     This area was prepped with Betadine, infiltrated with 1% lidocaine, And opened in a radial fashion with an 11 blade knife. Some purulent material was evacuated. The wound was then packed  with quarter-inch Nu Gauze. Sterile dressings were applied. He tolerated the procedure well. He will come back in one week for a wound check.

## 2011-07-18 ENCOUNTER — Ambulatory Visit (INDEPENDENT_AMBULATORY_CARE_PROVIDER_SITE_OTHER): Payer: BC Managed Care – PPO | Admitting: General Surgery

## 2011-07-18 ENCOUNTER — Encounter (INDEPENDENT_AMBULATORY_CARE_PROVIDER_SITE_OTHER): Payer: Self-pay | Admitting: General Surgery

## 2011-07-18 VITALS — BP 138/90 | HR 72 | Temp 97.4°F | Resp 16 | Ht 65.0 in | Wt 161.2 lb

## 2011-07-18 DIAGNOSIS — K612 Anorectal abscess: Secondary | ICD-10-CM

## 2011-07-18 DIAGNOSIS — K611 Rectal abscess: Secondary | ICD-10-CM

## 2011-07-18 NOTE — Progress Notes (Signed)
Subjective:     Patient ID: Shawn Walter, male   DOB: Feb 25, 1971, 41 y.o.   MRN: [id number]  HPI The patient is a 41 year old male who is about a week out from an incision and drainage of a perirectal abscess. He has been keeping the area very clean. He's had minimal drainage. His pain is improving. His bowel movements are normal.  Review of Systems     Objective:   Physical Exam On exam the pararectal skin looks good. The opening was very small and the wound is clean. There is no drainage.    Assessment:     Status post incision and drainage of perirectal abscess    Plan:     At this point I would like him to continue to keep the area clean and dry as he has. We will plan to see him back in a couple weeks to make sure the area has healed.

## 2011-08-05 ENCOUNTER — Encounter (INDEPENDENT_AMBULATORY_CARE_PROVIDER_SITE_OTHER): Payer: BC Managed Care – PPO | Admitting: General Surgery

## 2011-09-18 ENCOUNTER — Other Ambulatory Visit: Payer: Self-pay | Admitting: Internal Medicine

## 2011-09-19 NOTE — Telephone Encounter (Signed)
Refill done.  

## 2011-10-07 ENCOUNTER — Ambulatory Visit (INDEPENDENT_AMBULATORY_CARE_PROVIDER_SITE_OTHER): Payer: BC Managed Care – PPO | Admitting: General Surgery

## 2011-11-05 ENCOUNTER — Other Ambulatory Visit: Payer: Self-pay | Admitting: Internal Medicine

## 2011-11-06 NOTE — Telephone Encounter (Signed)
Refill done.  

## 2011-11-10 ENCOUNTER — Other Ambulatory Visit: Payer: Self-pay | Admitting: Internal Medicine

## 2011-11-11 NOTE — Telephone Encounter (Signed)
Refill done.  

## 2011-11-20 ENCOUNTER — Encounter (INDEPENDENT_AMBULATORY_CARE_PROVIDER_SITE_OTHER): Payer: Self-pay | Admitting: General Surgery

## 2011-11-20 ENCOUNTER — Ambulatory Visit (INDEPENDENT_AMBULATORY_CARE_PROVIDER_SITE_OTHER): Payer: BC Managed Care – PPO | Admitting: General Surgery

## 2011-11-20 VITALS — BP 119/81 | HR 73 | Temp 97.8°F | Ht 65.0 in | Wt 162.8 lb

## 2011-11-20 DIAGNOSIS — K611 Rectal abscess: Secondary | ICD-10-CM

## 2011-11-20 DIAGNOSIS — K612 Anorectal abscess: Secondary | ICD-10-CM

## 2011-11-20 NOTE — Progress Notes (Signed)
Subjective:     Patient ID: Shawn Walter, male   DOB: [birth year], 41 y.o.   MRN: [id number]  HPI The patient is a 41 year old gentleman who is about 3-4 months out from a incision and drainage of a perirectal abscess. Since his last visit he notes that he'll have boils that come and go. He denies any pain today. He denies any fevers or chills. His bowels are moving regularly.  Review of Systems     Objective:   Physical Exam On exam his perirectal skin looks good. his wound is completely healed. There are no sign of boils. He has good rectal tone and no rectal mass .    Assessment:     A 4 month status post incision and drainage of a perirectal abscess that is completely resolved    Plan:     At this point he can return to his normal activities and we will see him back on a p.r.n. basis

## 2012-02-26 ENCOUNTER — Other Ambulatory Visit: Payer: Self-pay | Admitting: Internal Medicine

## 2012-02-27 NOTE — Telephone Encounter (Signed)
Refill done.  

## 2012-03-28 ENCOUNTER — Other Ambulatory Visit: Payer: Self-pay | Admitting: Internal Medicine

## 2012-03-29 NOTE — Telephone Encounter (Signed)
Refill done.  

## 2012-04-26 ENCOUNTER — Other Ambulatory Visit: Payer: Self-pay | Admitting: Internal Medicine

## 2012-04-27 NOTE — Telephone Encounter (Signed)
Spoke to pt's wife. She stated that the pt would call back to schedule appt.  Refill done.

## 2012-04-27 NOTE — Telephone Encounter (Signed)
Ok  a two-week supply, schedule a acute visit within 2 weeks. If no visit, no further refills. Please  document that you talk to the patient and make him aware of above.

## 2012-04-27 NOTE — Telephone Encounter (Signed)
Pt has not been seen within a year. OK to refill? 

## 2012-05-07 ENCOUNTER — Ambulatory Visit: Payer: BC Managed Care – PPO | Admitting: Internal Medicine

## 2012-05-14 ENCOUNTER — Encounter: Payer: Self-pay | Admitting: Internal Medicine

## 2012-05-14 ENCOUNTER — Ambulatory Visit (INDEPENDENT_AMBULATORY_CARE_PROVIDER_SITE_OTHER): Payer: BC Managed Care – PPO | Admitting: Internal Medicine

## 2012-05-14 VITALS — BP 130/74 | HR 70 | Temp 97.9°F | Wt 161.0 lb

## 2012-05-14 DIAGNOSIS — E785 Hyperlipidemia, unspecified: Secondary | ICD-10-CM

## 2012-05-14 DIAGNOSIS — M25519 Pain in unspecified shoulder: Secondary | ICD-10-CM

## 2012-05-14 DIAGNOSIS — E119 Type 2 diabetes mellitus without complications: Secondary | ICD-10-CM

## 2012-05-14 LAB — COMPREHENSIVE METABOLIC PANEL
ALT: 30 U/L (ref 0–53)
CO2: 27 mEq/L (ref 19–32)
Calcium: 9 mg/dL (ref 8.4–10.5)
Chloride: 99 mEq/L (ref 96–112)
Creatinine, Ser: 0.7 mg/dL (ref 0.4–1.5)
GFR: 131.86 mL/min (ref >60.00)
Glucose, Bld: 214 mg/dL — ABNORMAL HIGH (ref 70–99)
Total Bilirubin: 0.9 mg/dL (ref 0.3–1.2)

## 2012-05-14 LAB — LIPID PANEL
HDL: 39 mg/dL — ABNORMAL LOW (ref >39.00)
Triglycerides: 298 mg/dL — ABNORMAL HIGH (ref 0.0–149.0)

## 2012-05-14 LAB — MICROALBUMIN / CREATININE URINE RATIO
Creatinine,U: 177.1 mg/dL
Microalb, Ur: 4.6 mg/dL — ABNORMAL HIGH (ref 0.0–1.9)

## 2012-05-14 LAB — HEMOGLOBIN A1C: Hgb A1c MFr Bld: 10.6 % — ABNORMAL HIGH (ref 4.6–6.5)

## 2012-05-14 MED ORDER — GLUCOSE BLOOD VI STRP: ORAL_STRIP | Status: DC

## 2012-05-14 MED ORDER — ONETOUCH ULTRASOFT LANCETS MISC: Status: DC

## 2012-05-14 MED ORDER — ATORVASTATIN CALCIUM 20 MG PO TABS: 20.0000 mg | ORAL_TABLET | Freq: Every day | ORAL | Status: DC

## 2012-05-14 MED ORDER — NYSTATIN 100000 UNIT/GM EX OINT: TOPICAL_OINTMENT | Freq: Two times a day (BID) | CUTANEOUS | Status: DC

## 2012-05-14 MED ORDER — METFORMIN HCL 850 MG PO TABS: ORAL_TABLET | ORAL | Status: DC

## 2012-05-14 NOTE — Assessment & Plan Note (Signed)
Patient ran out of cholesterol medication a week ago, will refill meds and check labs.

## 2012-05-14 NOTE — Assessment & Plan Note (Addendum)
Shoulder pain, right side for several months. Sprain? Recommend ibuprofen as needed and physical therapy. If not better, we'll need to see sports med Addendum, pain is going for several months, desires a referral. We'll arrange

## 2012-05-14 NOTE — Patient Instructions (Signed)
Please come back in  3 months for a complete physical, fasting ---- Motrin (ibuprofen) 200 mg OTC 2 tablets every 6 hours as needed for pain. Always take it with food. Watch for stomach side effects (gastritis): nausea, stomach pain, change in the color of stools.

## 2012-05-14 NOTE — Progress Notes (Signed)
  Subjective:    Patient ID: Shawn Walter, male    DOB: March 12, 1971, 42 y.o.   MRN: [id number]  HPI Routine checkup, last visit more than a year ago. Diabetes, reports he has been taking his medications regularly, not ambulatory CBGs recently. Needs a new glucometer. High cholesterol, reports good compliance with medication except for the last week, no apparent side effects. Shoulder pain for several months, no obvious injury but he is active playing cricket and tennis. Pain is located at the deltoid area and worse with arm elevation. Reports few days history of irritation at the GU area, glans and foreskin.  Past Medical History  Diagnosis Date  . Diabetes mellitus   . Hyperlipidemia   . Normal cardiac stress test 02-2011    Past Surgical History  Procedure Date  . Incise and drain abcess 07/11/11    perirectal abscess    History   Social History  . Marital Status: Married    Spouse Name: N/A    Number of Children: N/A  . Years of Education: N/A   Occupational History  . works at [employer], Arts administrator    Social History Main Topics  . Smoking status: Former Smoker    Quit date: 05/13/1999  . Smokeless tobacco: Never Used  . Alcohol Use: Yes     Comment: socially   . Drug Use: No  . Sexually Active: Not on file   Other Topics Concern  . Not on file   Social History Narrative   Married, born in [place]  ---children x 2  (2004, 2010)   Review of Systems No chest pain or shortness of breath No nausea, vomiting, diarrhea No dysuria or gross hematuria He did have a flu shot this season.     Objective:   Physical Exam General -- alert, well-developed, and well-nourished.   Lungs -- normal respiratory effort, no intercostal retractions, no accessory muscle use, and normal breath sounds.   Heart-- normal rate, regular rhythm, no murmur, and no gallop.   Extremities-- no pretibial edema bilaterally.  Shoulders are symmetric, not tender to palpation. Right shoulder pain  reproducible by arm elevation. GU-- scrotal examination normal, penis is uncircumcised, mild erythema, scaliness at the foreskin and glans. No vesicles or ulcerations noted. No discharge. Neurologic-- alert & oriented X3 and strength normal in all extremities. Psych-- Cognition and judgment appear intact. Alert and cooperative with normal attention span and concentration.  not anxious appearing and not depressed appearing.      Assessment & Plan:

## 2012-05-14 NOTE — Assessment & Plan Note (Addendum)
Patient has not been seen in more than a year, last A1c 7.1. Reports a recent eye exam which was negative for retinopathy. Reports good compliance with DM medications. Plan: Labs Strongly encouraged patient to come back in 3 months for a physical. Told patient that without seeing him 3-4 times a year I can't  help him. He has balanophostitis, likely related to diabetes, prescribed nystatin. Needs a new glucometer, one was provided, CBG goals information provided as well.

## 2012-05-18 MED ORDER — SITAGLIPTIN PHOS-METFORMIN HCL 50-1000 MG PO TABS: 1.0000 | ORAL_TABLET | Freq: Two times a day (BID) | ORAL | Status: DC

## 2012-05-18 MED ORDER — GLIMEPIRIDE 4 MG PO TABS: 4.0000 mg | ORAL_TABLET | Freq: Every day | ORAL | Status: DC

## 2012-05-18 NOTE — Addendum Note (Signed)
Addended by: Edwena Felty T on: 05/18/2012 05:16 PM   Modules accepted: Orders

## 2012-05-19 ENCOUNTER — Ambulatory Visit (INDEPENDENT_AMBULATORY_CARE_PROVIDER_SITE_OTHER): Payer: BC Managed Care – PPO | Admitting: Family Medicine

## 2012-05-19 ENCOUNTER — Encounter: Payer: Self-pay | Admitting: Family Medicine

## 2012-05-19 VITALS — BP 139/87 | HR 78 | Ht 65.0 in | Wt 160.0 lb

## 2012-05-19 DIAGNOSIS — M25519 Pain in unspecified shoulder: Secondary | ICD-10-CM

## 2012-05-19 DIAGNOSIS — M25511 Pain in right shoulder: Secondary | ICD-10-CM

## 2012-05-19 MED ORDER — MELOXICAM 15 MG PO TABS: 15.0000 mg | ORAL_TABLET | Freq: Every day | ORAL | Status: DC

## 2012-05-19 NOTE — Progress Notes (Signed)
Subjective:    Patient ID: Shawn Walter, male    DOB: 05/21/1970, 42 y.o.   MRN: [id number]  PCP: Dr. Drue Novel  HPI 42 yo M here for right shoulder pain.  Patient denies known injury. States about 6 months ago started to get right shoulder pain more with activities such as playing tennis and cricket. Would bother him with overhead motions, throwing, reaching behind. No night pain except for one night in past 6 months. No numbness or tingling currently. No radiation of pain. Right handed. No swelling, bruising. No neck pain.  Past Medical History  Diagnosis Date  . Diabetes mellitus   . Hyperlipidemia   . Normal cardiac stress test 02-2011     Current Outpatient Prescriptions on File Prior to Visit  Medication Sig Dispense Refill  . atorvastatin (LIPITOR) 20 MG tablet Take 1 tablet (20 mg total) by mouth daily.  30 tablet  6  . metFORMIN (GLUCOPHAGE) 850 MG tablet 1.5 tablets by mouth two times a day.  90 tablet  6  . ACCU-CHEK AVIVA test strip CHECK ONCE DAILY  100 each  1  . aspirin 81 MG tablet Take 81 mg by mouth daily.        Marland Kitchen glimepiride (AMARYL) 4 MG tablet Take 1 tablet (4 mg total) by mouth daily before breakfast.  30 tablet  3  . glucose blood test strip Use as instructed  100 each  12  . Lancets (ONETOUCH ULTRASOFT) lancets Use as instructed  100 each  12  . nitroGLYCERIN (NITROSTAT) 0.4 MG SL tablet Place 1 tablet (0.4 mg total) under the tongue every 5 (five) minutes as needed for chest pain.  20 tablet  0  . nystatin ointment (MYCOSTATIN) Apply topically 2 (two) times daily.  30 g  0  . sitaGLIPtan-metformin (JANUMET) 50-1000 MG per tablet Take 1 tablet by mouth 2 (two) times daily with a meal.  60 tablet  3    Past Surgical History  Procedure Date  . Incise and drain abcess 07/11/11    perirectal abscess     No Known Allergies  History   Social History  . Marital Status: Married    Spouse Name: N/A    Number of Children: N/A  . Years of Education: N/A     Occupational History  . works at [employer], Arts administrator    Social History Main Topics  . Smoking status: Former Smoker    Quit date: 05/13/1999  . Smokeless tobacco: Never Used  . Alcohol Use: Yes     Comment: socially   . Drug Use: No  . Sexually Active: Not on file   Other Topics Concern  . Not on file   Social History Narrative   Married, born in [place]  ---children x 2  (2004, 2010)    Family History  Problem Relation Age of Onset  . Cancer Mother     breast  . Hypertension Neg Hx   . Hyperlipidemia Neg Hx   . Heart attack Neg Hx   . Diabetes Neg Hx     BP 139/87  Pulse 78  Ht 5\' 5"  (1.651 m)  Wt 160 lb (72.576 kg)  BMI 26.63 kg/m2  Review of Systems See HPI above.    Objective:   Physical Exam Gen: NAD  R shoulder: No swelling, ecchymoses.  No gross deformity. No TTP AC joint.  Mild biceps tendon TTP. FROM with mild painful arc. Positive Hawkins, Neers. Negative Speeds, Yergasons. Strength 5/5 with empty  can and resisted internal/external rotation.  Pain with empty can. Negative apprehension. NV intact distally.     Assessment & Plan:  1. Right shoulder pain - 2/2 rotator cuff impingement.  Avoid painful activities, start meloxicam and PT, home exercise program.  Consider further imaging, injection, nitro patches if not improving.  F/u in 6 weeks.

## 2012-05-19 NOTE — Patient Instructions (Addendum)
You have rotator cuff impingement Try to avoid painful activities (overhead activities, lifting with extended arm) as much as possible. Meloxicam 15mg  daily with food for pain and inflammation. Subacromial injection may be beneficial to help with pain and to decrease inflammation. Start physical therapy with transition to home exercise program. Do home exercise program with theraband and scapular stabilization exercises daily (physical therapy will give this and review these exercises with you) - these are very important for long term relief even if an injection was given. If not improving at follow-up we will consider further imaging, injection, and/or nitro patches. Follow up with me in 6 weeks for reevaluation.

## 2012-05-20 ENCOUNTER — Encounter: Payer: Self-pay | Admitting: *Deleted

## 2012-05-20 NOTE — Assessment & Plan Note (Signed)
2/2 rotator cuff impingement.  Avoid painful activities, start meloxicam and PT, home exercise program.  Consider further imaging, injection, nitro patches if not improving.  F/u in 6 weeks.

## 2012-05-27 ENCOUNTER — Ambulatory Visit: Payer: BC Managed Care – PPO | Attending: Family Medicine | Admitting: Physical Therapy

## 2012-05-27 DIAGNOSIS — M25519 Pain in unspecified shoulder: Secondary | ICD-10-CM | POA: Insufficient documentation

## 2012-05-27 DIAGNOSIS — IMO0001 Reserved for inherently not codable concepts without codable children: Secondary | ICD-10-CM | POA: Insufficient documentation

## 2012-06-01 ENCOUNTER — Ambulatory Visit: Payer: BC Managed Care – PPO | Admitting: Rehabilitation

## 2012-06-04 ENCOUNTER — Ambulatory Visit: Payer: BC Managed Care – PPO | Admitting: Rehabilitation

## 2012-06-07 ENCOUNTER — Encounter: Payer: BC Managed Care – PPO | Admitting: Physical Therapy

## 2012-06-07 ENCOUNTER — Ambulatory Visit: Payer: BC Managed Care – PPO | Admitting: Physical Therapy

## 2012-06-11 ENCOUNTER — Ambulatory Visit: Payer: BC Managed Care – PPO | Admitting: Physical Therapy

## 2012-06-26 ENCOUNTER — Other Ambulatory Visit: Payer: Self-pay

## 2012-06-30 ENCOUNTER — Encounter: Payer: Self-pay | Admitting: Family Medicine

## 2012-06-30 ENCOUNTER — Ambulatory Visit (INDEPENDENT_AMBULATORY_CARE_PROVIDER_SITE_OTHER): Payer: BC Managed Care – PPO | Admitting: Family Medicine

## 2012-06-30 VITALS — BP 128/92 | HR 81 | Ht 65.0 in | Wt 160.0 lb

## 2012-06-30 DIAGNOSIS — M25519 Pain in unspecified shoulder: Secondary | ICD-10-CM

## 2012-06-30 NOTE — Assessment & Plan Note (Signed)
2/2 rotator cuff impingement.  Much improved with PT, home exercises.  Advised to continue HEP for next 6 weeks most days of the week.  Meloxicam only as needed now.    F/u in 6 weeks or prn.  Consider further imaging, injection, nitro patches if not improving.

## 2012-06-30 NOTE — Progress Notes (Signed)
Subjective:    Patient ID: Shawn Walter, male    DOB: [birth year], 43 y.o.   MRN: [id number]  PCP: Dr. Drue Novel  HPI  43 yo M here for f/u right shoulder pain.  1/8: Patient denies known injury. States about 6 months ago started to get right shoulder pain more with activities such as playing tennis and cricket. Would bother him with overhead motions, throwing, reaching behind. No night pain except for one night in past 6 months. No numbness or tingling currently. No radiation of pain. Right handed. No swelling, bruising. No neck pain.  2/19: Patient reports he is over 80% improved from last visit. Did physical therapy for 3-4 sessions and doing home exercise program. Much improved movement and discomfort minor (noticed with reaching back, also behind head). Takes meloxicam daily. No other complaints. No night pain.  Past Medical History  Diagnosis Date  . Diabetes mellitus   . Hyperlipidemia   . Normal cardiac stress test 02-2011     Current Outpatient Prescriptions on File Prior to Visit  Medication Sig Dispense Refill  . ACCU-CHEK AVIVA test strip CHECK ONCE DAILY  100 each  1  . aspirin 81 MG tablet Take 81 mg by mouth daily.        Marland Kitchen atorvastatin (LIPITOR) 20 MG tablet Take 1 tablet (20 mg total) by mouth daily.  30 tablet  6  . glimepiride (AMARYL) 4 MG tablet Take 1 tablet (4 mg total) by mouth daily before breakfast.  30 tablet  3  . glucose blood test strip Use as instructed  100 each  12  . Lancets (ONETOUCH ULTRASOFT) lancets Use as instructed  100 each  12  . meloxicam (MOBIC) 15 MG tablet Take 1 tablet (15 mg total) by mouth daily. With food.  30 tablet  1  . metFORMIN (GLUCOPHAGE) 850 MG tablet 1.5 tablets by mouth two times a day.  90 tablet  6  . nitroGLYCERIN (NITROSTAT) 0.4 MG SL tablet Place 1 tablet (0.4 mg total) under the tongue every 5 (five) minutes as needed for chest pain.  20 tablet  0  . nystatin ointment (MYCOSTATIN) Apply topically 2 (two)  times daily.  30 g  0  . sitaGLIPtan-metformin (JANUMET) 50-1000 MG per tablet Take 1 tablet by mouth 2 (two) times daily with a meal.  60 tablet  3   No current facility-administered medications on file prior to visit.    Past Surgical History  Procedure Laterality Date  . Incise and drain abcess  07/11/11    perirectal abscess     No Known Allergies  History   Social History  . Marital Status: Married    Spouse Name: N/A    Number of Children: N/A  . Years of Education: N/A   Occupational History  . works at [employer], Arts administrator    Social History Main Topics  . Smoking status: Former Smoker    Quit date: 05/13/1999  . Smokeless tobacco: Never Used  . Alcohol Use: Yes     Comment: socially   . Drug Use: No  . Sexually Active: Not on file   Other Topics Concern  . Not on file   Social History Narrative   Married, born in [place]  ---   children x 2  (2004, 2010)          Family History  Problem Relation Age of Onset  . Cancer Mother     breast  . Hypertension Neg Hx   .  Hyperlipidemia Neg Hx   . Heart attack Neg Hx   . Diabetes Neg Hx     BP 128/92  Pulse 81  Ht 5\' 5"  (1.651 m)  Wt 160 lb (72.576 kg)  BMI 26.63 kg/m2  Review of Systems  See HPI above.    Objective:   Physical Exam  Gen: NAD  R shoulder: No swelling, ecchymoses.  No gross deformity. No TTP AC joint or biceps tendon. FROM with negative painful arc. Negative Hawkins, Neers. Negative Speeds, Yergasons. Strength 5/5 with empty can and resisted internal/external rotation.  Negative apprehension. NV intact distally.     Assessment & Plan:  1. Right shoulder pain - 2/2 rotator cuff impingement.  Much improved with PT, home exercises.  Advised to continue HEP for next 6 weeks most days of the week.  Meloxicam only as needed now.    F/u in 6 weeks or prn.  Consider further imaging, injection, nitro patches if not improving.

## 2012-08-12 ENCOUNTER — Encounter: Payer: BC Managed Care – PPO | Admitting: Internal Medicine

## 2012-09-02 ENCOUNTER — Ambulatory Visit (INDEPENDENT_AMBULATORY_CARE_PROVIDER_SITE_OTHER): Payer: BC Managed Care – PPO | Admitting: Internal Medicine

## 2012-09-02 ENCOUNTER — Encounter: Payer: Self-pay | Admitting: Internal Medicine

## 2012-09-02 VITALS — BP 118/78 | HR 75 | Temp 98.1°F | Ht 66.0 in | Wt 164.0 lb

## 2012-09-02 DIAGNOSIS — Z0000 Encounter for general adult medical examination without abnormal findings: Secondary | ICD-10-CM

## 2012-09-02 DIAGNOSIS — E785 Hyperlipidemia, unspecified: Secondary | ICD-10-CM

## 2012-09-02 DIAGNOSIS — E119 Type 2 diabetes mellitus without complications: Secondary | ICD-10-CM

## 2012-09-02 LAB — COMPREHENSIVE METABOLIC PANEL
ALT: 37 U/L (ref 0–53)
AST: 25 U/L (ref 0–37)
Chloride: 102 mEq/L (ref 96–112)
Creatinine, Ser: 0.8 mg/dL (ref 0.4–1.5)
Total Bilirubin: 0.3 mg/dL (ref 0.3–1.2)

## 2012-09-02 LAB — LIPID PANEL
Cholesterol: 167 mg/dL (ref 0–200)
LDL Cholesterol: 108 mg/dL — ABNORMAL HIGH (ref 0–99)
Total CHOL/HDL Ratio: 4
VLDL: 20.4 mg/dL (ref 0.0–40.0)

## 2012-09-02 LAB — CBC WITH DIFFERENTIAL/PLATELET
Basophils Absolute: 0.1 10*3/uL (ref 0.0–0.1)
Eosinophils Absolute: 0.2 10*3/uL (ref 0.0–0.7)
Hemoglobin: 13.6 g/dL (ref 13.0–17.0)
Lymphocytes Relative: 31 % (ref 12.0–46.0)
Lymphs Abs: 2.1 10*3/uL (ref 0.7–4.0)
MCHC: 33.8 g/dL (ref 30.0–36.0)
Neutro Abs: 4 10*3/uL (ref 1.4–7.7)
RDW: 12.6 % (ref 11.5–14.6)

## 2012-09-02 LAB — MICROALBUMIN / CREATININE URINE RATIO: Microalb, Ur: 3.9 mg/dL — ABNORMAL HIGH (ref 0.0–1.9)

## 2012-09-02 NOTE — Progress Notes (Signed)
  Subjective:    Patient ID: Shawn Walter, male    DOB: 1970/06/03, 42 y.o.   MRN: [id number]  HPI Complete physical exam  Past Medical History  Diagnosis Date  . Diabetes mellitus   . Hyperlipidemia   . Normal cardiac stress test 02-2011    Past Surgical History  Procedure Laterality Date  . Incise and drain abcess  07/11/11    perirectal abscess    History   Social History  . Marital Status: Married    Spouse Name: N/A    Number of Children: 2  . Years of Education: N/A   Occupational History  . works at [employer], Arts administrator    Social History Main Topics  . Smoking status: Former Smoker    Quit date: 05/13/1999  . Smokeless tobacco: Never Used  . Alcohol Use: Yes     Comment: socially   . Drug Use: No  . Sexually Active: Not on file   Other Topics Concern  . Not on file   Social History Narrative   Married, born in [place]     children x 2  (2004, 2010)   Diet: eating out more d/t busy schedule   Exercise: some physycal activity 1-2/week depending on the weather             Family History  Problem Relation Age of Onset  . Breast cancer Mother   . Hypertension Neg Hx   . Hyperlipidemia Neg Hx   . Heart attack Neg Hx   . Diabetes Neg Hx   . Colon cancer Neg Hx   . Prostate cancer Neg Hx      Review of Systems No chest pain or shortness or breath. No nausea, vomiting, diarrhea or blood in the stools. No dysuria or gross hematuria. Occasional fatigue, denies lower extremity edema, snoring or feeling sleepy frequently. He does work very long hours every day and has to wake up early because he has little children.  We changed his diabetes medication, since then about 4 times a week his blood sugars go down to the 70s, he feels sweaty and jittery. Symptoms decreased with candy. He has noted that is more hungry than usual.    Objective:   Physical Exam General -- alert, well-developed, No apparent distress   Neck --no thyromegaly Lungs -- normal respiratory  effort, no intercostal retractions, no accessory muscle use, and normal breath sounds.   Heart-- normal rate, regular rhythm, no murmur, and no gallop.   Abdomen--soft, non-tender, no distention, no masses, no HSM, no guarding, and no rigidity.   Extremities-- no pretibial edema bilaterally Neurologic-- alert & oriented X3 and strength normal in all extremities. Psych-- Cognition and judgment appear intact. Alert and cooperative with normal attention span and concentration.  not anxious appearing and not depressed appearing.      Assessment & Plan:

## 2012-09-02 NOTE — Assessment & Plan Note (Addendum)
Last cholesterol panel showed for a elevatedcholesterol, rechecking today.

## 2012-09-02 NOTE — Assessment & Plan Note (Addendum)
Tdap 2008 Never had a cscope  Diet and exercise discussed Labs ROS is + for fatigue, will do labs however it is likely due to extensive work hours and possibly over control of diabetes (See review of systems)

## 2012-09-02 NOTE — Assessment & Plan Note (Signed)
Based on last A1C we changed Metformin 850 mg twice a day to janumet 50-100 bid. Since then, he is having frequent low sugars. Plan: Decrease glimeperide from 4 mg to 2 mg daily. Labs Further advice with results

## 2012-09-02 NOTE — Patient Instructions (Addendum)
Decrease dose of glimepiride to 2 mg daily. Increase your exercise and physical activity, try to eat healthier. If you continue having low blood sugars more than once a week, stop glimepiride and let me know. Next visit in 3 months

## 2012-09-09 MED ORDER — PIOGLITAZONE HCL 30 MG PO TABS: 30.0000 mg | ORAL_TABLET | Freq: Every day | ORAL | Status: DC

## 2012-09-09 MED ORDER — LOSARTAN POTASSIUM 25 MG PO TABS: 25.0000 mg | ORAL_TABLET | Freq: Every day | ORAL | Status: DC

## 2012-09-09 NOTE — Addendum Note (Signed)
Addended by: Edwena Felty T on: 09/09/2012 04:18 PM   Modules accepted: Orders

## 2012-10-12 ENCOUNTER — Telehealth: Payer: Self-pay | Admitting: Internal Medicine

## 2012-10-12 DIAGNOSIS — I10 Essential (primary) hypertension: Secondary | ICD-10-CM

## 2012-10-12 NOTE — Telephone Encounter (Signed)
Advise patient, in April we did some medication changes, he needs a BMP, DX hypertension. Please arrange

## 2012-10-13 ENCOUNTER — Encounter: Payer: Self-pay | Admitting: *Deleted

## 2012-10-13 NOTE — Telephone Encounter (Signed)
Lab orders entered, letter mailed.

## 2012-10-20 ENCOUNTER — Other Ambulatory Visit: Payer: Self-pay | Admitting: Internal Medicine

## 2012-10-20 NOTE — Telephone Encounter (Signed)
Refill done.  

## 2012-12-16 ENCOUNTER — Encounter: Payer: Self-pay | Admitting: Internal Medicine

## 2012-12-16 ENCOUNTER — Ambulatory Visit (INDEPENDENT_AMBULATORY_CARE_PROVIDER_SITE_OTHER): Payer: BC Managed Care – PPO | Admitting: Internal Medicine

## 2012-12-16 VITALS — BP 110/80 | HR 85 | Temp 97.8°F | Wt 170.0 lb

## 2012-12-16 DIAGNOSIS — E119 Type 2 diabetes mellitus without complications: Secondary | ICD-10-CM

## 2012-12-16 LAB — BASIC METABOLIC PANEL
BUN: 12 mg/dL (ref 6–23)
CO2: 27 mEq/L (ref 19–32)
Calcium: 8.7 mg/dL (ref 8.4–10.5)
Glucose, Bld: 348 mg/dL — ABNORMAL HIGH (ref 70–99)
Sodium: 135 mEq/L (ref 135–145)

## 2012-12-16 NOTE — Progress Notes (Signed)
  Subjective:    Patient ID: Shawn Walter, male    DOB: 17-Oct-1970, 42 y.o.   MRN: [id number]  HPI Followup from previous visit We added losartan, discontinue Amaryl and started Actos. Good compliance w/ changes  Past Medical History  Diagnosis Date  . Diabetes mellitus   . Hyperlipidemia   . Normal cardiac stress test 02-2011    Past Surgical History  Procedure Laterality Date  . Incise and drain abcess  07/11/11    perirectal abscess     Review of Systems Diet has not been good in the last few days b/c was on vacation. Has not taken amb  BPs,  BP today is 110/80 Initially with new medications he felt slightly tired but now is back to normal. No nausea, vomiting, diarrhea.    Objective:   Physical Exam BP 110/80  Pulse 85  Temp(Src) 97.8 F (36.6 C) (Oral)  Wt 170 lb (77.111 kg)  BMI 27.45 kg/m2  SpO2 97%  General -- alert, well-developed,NAD Psych-- Cognition and judgment appear intact. Alert and cooperative with normal attention span and concentration.  not anxious appearing and not depressed appearing.       Assessment & Plan:  Today , I spent more than 15  min with the patient, >50% of the time counseling, We did review his previous A1c's, talked about diabetes Legacy, discussed diet and exercise

## 2012-12-16 NOTE — Assessment & Plan Note (Signed)
Since the last time he was here, we discontinue Amaryl due to low sugars. A1c came back 7.8. He was started on Actos and losartan  for kidney protection--->  good compliance and tolerance w/ new meds He just came from a vacation in [place], diet has not been good lately. Plan: A1c, BMP, LFTs spirit Diet and exercise discussed; States he has enough information about diet Concept of diabetes legacy discussed, he has not been well-controlled in a while, if today's a1c is not <7, will refer to endocrinology.

## 2012-12-20 ENCOUNTER — Telehealth: Payer: Self-pay | Admitting: *Deleted

## 2012-12-20 DIAGNOSIS — E119 Type 2 diabetes mellitus without complications: Secondary | ICD-10-CM

## 2012-12-20 NOTE — Telephone Encounter (Signed)
Message copied by Shirlee More I on Mon Dec 20, 2012  8:11 AM ------      Message from: Shawn Walter      Created: Sun Dec 19, 2012  2:09 PM       Please arrange any endocrinology referral, dx diabetes. No need to call the patient, he knows ------

## 2012-12-20 NOTE — Telephone Encounter (Signed)
Orders placed.

## 2013-01-25 ENCOUNTER — Ambulatory Visit: Payer: BC Managed Care – PPO | Admitting: Endocrinology

## 2013-01-31 ENCOUNTER — Encounter: Payer: Self-pay | Admitting: Nutrition

## 2013-01-31 ENCOUNTER — Ambulatory Visit (INDEPENDENT_AMBULATORY_CARE_PROVIDER_SITE_OTHER): Payer: BC Managed Care – PPO | Admitting: Endocrinology

## 2013-01-31 ENCOUNTER — Encounter: Payer: BC Managed Care – PPO | Attending: Endocrinology | Admitting: Nutrition

## 2013-01-31 ENCOUNTER — Encounter: Payer: Self-pay | Admitting: Endocrinology

## 2013-01-31 VITALS — BP 120/80 | HR 66 | Temp 98.6°F | Ht 66.0 in | Wt 168.5 lb

## 2013-01-31 DIAGNOSIS — E119 Type 2 diabetes mellitus without complications: Secondary | ICD-10-CM | POA: Insufficient documentation

## 2013-01-31 DIAGNOSIS — Z713 Dietary counseling and surveillance: Secondary | ICD-10-CM | POA: Insufficient documentation

## 2013-01-31 DIAGNOSIS — E785 Hyperlipidemia, unspecified: Secondary | ICD-10-CM

## 2013-01-31 MED ORDER — LIRAGLUTIDE 18 MG/3ML ~~LOC~~ SOPN: 1.2000 mg | PEN_INJECTOR | Freq: Every day | SUBCUTANEOUS | Status: DC

## 2013-01-31 NOTE — Progress Notes (Signed)
Reason for Appointment : Consultation for Type 2 Diabetes  History of Present Illness          Diagnosis: Type 2 diabetes mellitus, date of diagnosis: 2004        Past history: His glucose was about 220 at diagnosis. He thinks he was not treated with medications but told to modify his diet and start exercise. Not clear how his diabetes was controlled with this regimen He thinks about 3 years ago he was started on metformin, probably when his A1c was 8.2%. Blood sugar was somewhat better but his A1c at best was 7.1% Although he had some initial improvement with metformin initially his blood sugars have been generally higher this year especially in 1/14 At some point he was also given Amaryl 4 mg but this was stopped when he was having low blood sugars  Recent history: Over the last few months he has been totally noncompliant with diet, exercise and glucose monitoring. He has probably gained weight also. He does take Janumet and Actos but his glucose on the lab was 348 last month along with A1c of 9.5 He does not think he has any increased thirst or urination and generally drinks water when thirsty Over the last few years his weight has ranged from 158-175    Glucose monitoring:  none       Glucometer:? Accu-Chek active    Blood Glucose readings not done       Hypoglycemia frequency:  none recently.           Self-care: He may sometimes have higher carbohydrate meals  Meals: 3 meals per day.          Physical activity: exercise:none at present           Dietician visit: Most recent: never.         He has annual eye exams         Wt Readings from Last 3 Encounters:  01/31/13 168 lb 8 oz (76.431 kg)  12/16/12 170 lb (77.111 kg)  09/02/12 164 lb (74.39 kg)    Component     Latest Ref Rng 11/08/2009 01/17/2010 06/11/2010 05/14/2012 09/02/2012  Hemoglobin A1C     4.6 - 6.5 % 8.2 (H) 7.5 (H) 7.1 (H) 10.6 (H) 7.8 (H)   Component     Latest Ref Rng 12/16/2012  Hemoglobin A1C     4.6 - 6.5 %  9.5 (H)   Lab Results  Component Value Date   MICROALBUR 3.9* 09/02/2012      Medication List       This list is accurate as of: 01/31/13 11:59 PM.  Always use your most recent med list.               ACCU-CHEK AVIVA test strip  Generic drug:  glucose blood  CHECK ONCE DAILY     glucose blood test strip  Use as instructed     aspirin 81 MG tablet  Take 81 mg by mouth daily.     atorvastatin 20 MG tablet  Commonly known as:  LIPITOR  Take 1 tablet (20 mg total) by mouth daily.     JANUMET 50-1000 MG per tablet  Generic drug:  sitaGLIPtin-metformin     Liraglutide 18 MG/3ML Sopn  Commonly known as:  VICTOZA  Inject 1.2 mg into the skin daily.     losartan 25 MG tablet  Commonly known as:  COZAAR  Take 1 tablet (25 mg total) by mouth daily.  nitroGLYCERIN 0.4 MG SL tablet  Commonly known as:  NITROSTAT  Place 1 tablet (0.4 mg total) under the tongue every 5 (five) minutes as needed for chest pain.     onetouch ultrasoft lancets  Use as instructed     pioglitazone 30 MG tablet  Commonly known as:  ACTOS  Take 1 tablet (30 mg total) by mouth daily.          Allergies: No Known Allergies  Past Medical History  Diagnosis Date  . Diabetes mellitus   . Hyperlipidemia   . Normal cardiac stress test 02-2011     Past Surgical History  Procedure Laterality Date  . Incise and drain abcess  07/11/11    perirectal abscess     Family History  Problem Relation Age of Onset  . Breast cancer Mother   . Hypertension Neg Hx   . Hyperlipidemia Neg Hx   . Heart attack Neg Hx   . Diabetes Neg Hx   . Colon cancer Neg Hx   . Prostate cancer Neg Hx     Social History:  reports that he quit smoking about 13 years ago. He has never used smokeless tobacco. He reports that  drinks alcohol. He reports that he does not use illicit drugs.    Review of Systems       Lipids: He has been treated with Lipitor for a few years  Lab Results  Component Value Date    LDLCALC 108* 09/02/2012          Skin: No rash or infections     Thyroid:  No  unusual fatigue.     The blood pressure has been fairly normal and he is taking 25 mg losartan prophylactically      No swelling of feet.     No shortness of breath on exertion.     Bowel habits: Normal.       No frequency of urination or nocturia          No history of Numbness, tingling or burning in  feet    LABS:  No visits with results within 1 Week(s) from this visit. Latest known visit with results is:  Office Visit on 12/16/2012  Component Date Value Range Status  . Hemoglobin A1C 12/16/2012 9.5* 4.6 - 6.5 % Final   Glycemic Control Guidelines for People with Diabetes:Non Diabetic:  <6%Goal of Therapy: <7%Additional Action Suggested:  >8%   . Sodium 12/16/2012 135  135 - 145 mEq/L Final  . Potassium 12/16/2012 4.2  3.5 - 5.1 mEq/L Final  . Chloride 12/16/2012 102  96 - 112 mEq/L Final  . CO2 12/16/2012 27  19 - 32 mEq/L Final  . Glucose, Bld 12/16/2012 348* 70 - 99 mg/dL Final  . BUN 40/98/1191 12  6 - 23 mg/dL Final  . Creatinine, Ser 12/16/2012 0.9  0.4 - 1.5 mg/dL Final  . Calcium 47/82/9562 8.7  8.4 - 10.5 mg/dL Final  . GFR 13/12/6576 99.65  >60.00 mL/min Final  . ALT 12/16/2012 51  0 - 53 U/L Final  . AST 12/16/2012 36  0 - 37 U/L Final    Physical Examination:  BP 120/80  Pulse 66  Temp(Src) 98.6 F (37 C) (Oral)  Ht 5\' 6"  (1.676 m)  Wt 168 lb 8 oz (76.431 kg)  BMI 27.21 kg/m2  SpO2 97%  GENERAL:         Patient has abdominal obesity.   HEENT:  Eye exam shows normal external appearance. Fundus exam shows no retinopathy. Oral exam shows normal mucosa .  NECK:         General:  Neck exam shows no lymphadenopathy. Carotids are normal to palpation and no bruit heard. Thyroid is not enlarged and no nodules felt.   LUNGS:         Chest is symmetrical. Lungs are clear to auscultation.Marland Kitchen   HEART:         Heart sounds:  S1 and S2 are normal. No murmurs or clicks heard., no  S3 or S4.   ABDOMEN:         General:  There is no distention present. Liver and spleen are not palpable. No other mass or tenderness present.  EXTREMITIES:     There is no edema. No skin lesions present.Marland Kitchen  NEUROLOGICAL:        Vibration sense is mildly reduced in toes. Ankle jerks are absent bilaterally.          Diabetic foot exam:  as in the foot exam section MUSCULOSKELETAL:       There is no enlargement or deformity of the joints. Spine is normal to inspection.Marland Kitchen   PEDAL pulses: Normal SKIN:       No rash or lesions of concern.        ASSESSMENT:  Diabetes type 2, uncontrolled - 250.02    His blood sugars have been out of control for at least a year and this most likely indicates progression of his diabetes He has been also gaining weight and recently noncompliant with diet and exercise Currently not monitoring his blood sugars but his glucose was 348 on his labs last month Since he is already on maximum dose Janumet along with Actos he may well need insulin However since he should be able to lose weight with a GLP-1 drug like Victoza and this should be more effective will give him a trial of this  He agrees to start exercise again as before and get more diabetes education including meal planning He will be instructed on Victoza by nurse educator today. Discussed how this works, benefits, dosage, use of the flex pen, titration, side effects and safety. He  will start with the usual 0.6 mg and increased to 1.2 mg in 1 week We will continue Janumet and Actos at present and switch to metformin and Actos on his next visit Discussed checking the blood sugars at various times and he will bring his monitor for review on the next visit  Complications: None at present  Hyperlipidemia: His last LDL was relatively high at 108 and HDL low at 39, will need followup and hopefully improvement with better diabetes control and weight loss and exercise  Total visit time including counseling = 55  minutes  Jaspreet Bodner 01/31/2013, 8:33 AM

## 2013-01-31 NOTE — Patient Instructions (Addendum)
Please check blood sugars at least half the time about 2 hours after any meal and as directed on waking up. Please bring blood sugar monitor to each visit  Start VICTOZA injection with the sample pen once daily at the same time of the day.  Dial the dose to 0.6 mg for the first week.  You may  experience nausea in the first few days which usually gets better the After 1 week increase the dose to 1.2mg  daily if no nausea.  You may inject in the stomach, thigh or arm.   You will feel fullness of the stomach with starting the medication and should try to keep portions of food small.    Regular exercise 30-40 min 4-5 days a week

## 2013-01-31 NOTE — Progress Notes (Signed)
Pt. Was instructed on the use of the Victoza pen.  He will take 0.6 q HS for 7 days.  He will increase the dose to 1.2 after 7 days if no nausea.  Starter kit given with directions for pen use, and copay card.  Sample Victoza pen given with Nano pen needles.  Pt. Had no final questions.

## 2013-02-01 ENCOUNTER — Telehealth: Payer: Self-pay | Admitting: Endocrinology

## 2013-02-01 NOTE — Telephone Encounter (Signed)
Wife is aware and will call for a prior auth

## 2013-02-01 NOTE — Telephone Encounter (Signed)
Please advise.  Victoza is not covered by insurance.

## 2013-02-01 NOTE — Telephone Encounter (Signed)
Needs to call insurance to get prior authorization form sent to [place]

## 2013-02-17 ENCOUNTER — Other Ambulatory Visit (INDEPENDENT_AMBULATORY_CARE_PROVIDER_SITE_OTHER): Payer: BC Managed Care – PPO

## 2013-02-17 LAB — BASIC METABOLIC PANEL
CO2: 28 mEq/L (ref 19–32)
Calcium: 9.1 mg/dL (ref 8.4–10.5)
Chloride: 104 mEq/L (ref 96–112)
Glucose, Bld: 104 mg/dL — ABNORMAL HIGH (ref 70–99)
Potassium: 4.8 mEq/L (ref 3.5–5.1)
Sodium: 139 mEq/L (ref 135–145)

## 2013-02-21 ENCOUNTER — Encounter: Payer: Self-pay | Admitting: Endocrinology

## 2013-02-21 ENCOUNTER — Ambulatory Visit (INDEPENDENT_AMBULATORY_CARE_PROVIDER_SITE_OTHER): Payer: BC Managed Care – PPO | Admitting: Endocrinology

## 2013-02-21 VITALS — BP 128/64 | HR 70 | Temp 98.5°F | Resp 12 | Ht 65.0 in | Wt 166.9 lb

## 2013-02-21 DIAGNOSIS — E785 Hyperlipidemia, unspecified: Secondary | ICD-10-CM

## 2013-02-21 NOTE — Progress Notes (Signed)
Shawn Walter   Reason for Appointment : Followup for Type 2 Diabetes  History of Present Illness          Diagnosis: Type 2 diabetes mellitus, date of diagnosis: 2004        Past history: His glucose was about 220 at diagnosis. He thinks he was not treated with medications but told to modify his diet and start exercise. Not clear how his diabetes was controlled with this regimen He thinks about 3 years ago he was started on metformin, probably when his A1c was 8.2%. Blood sugar was somewhat better but his A1c at best was 7.1% Although he had some initial improvement with metformin initially his blood sugars have been generally higher this year especially in 1/14 At some point he was also given Amaryl 4 mg but this was stopped when he was having low blood sugars Prior to his initial consultation he had been totally noncompliant with diet, exercise and glucose monitoring. He  probably gained weight also. He was on Janumet and Actos but his glucose on the lab was 348 along with A1c of 9.5  Recent history:   He was supposed to start Victoza and was instructed on doing this on the last visit but because of the insurance denying coverage he did not start it and is still continuing on Janumet and Actos However he has made significant changes in his diet and exercise on his own and his blood sugars appear to be significantly better. He has been more motivated to exercise, previously had not done any His blood sugars are relatively higher in the morning and has not been checking them very regularly. However blood sugars before supper are quite good and fasting glucose in the lab was 104. Has not checked readings after supper  He thinks he is getting some headaches and dizziness and he believes this is from Actos. He gets lightheaded at times when standing up, not clear this is a new symptom Over the last few years his weight has ranged from 158-175    Glucose monitoring:  less than once a day        Glucometer: Accu-Chek nano    Blood Glucose readings: Fasting 117-144, evening 89-95 with 8 readings in the last 2 weeks         Hypoglycemia frequency:  none recently.           Self-care:   Meals: 3 meals per day.  now eating very little rice and cutting back on portions overall    Physical activity: exercise: On treadmill 30 minutes, 5 days a week        Dietician visit: Most recent: never.         He has annual eye exams         Wt Readings from Last 3 Encounters:  02/21/13 166 lb 14.4 oz (75.705 kg)  01/31/13 168 lb 8 oz (76.431 kg)  12/16/12 170 lb (77.111 kg)    Component     Latest Ref Rng 11/08/2009 01/17/2010 06/11/2010 05/14/2012 09/02/2012  Hemoglobin A1C     4.6 - 6.5 % 8.2 (H) 7.5 (H) 7.1 (H) 10.6 (H) 7.8 (H)   Component     Latest Ref Rng 12/16/2012  Hemoglobin A1C     4.6 - 6.5 % 9.5 (H)   Lab Results  Component Value Date   MICROALBUR 3.9* 09/02/2012      Medication List       This list is accurate as of:  02/21/13  8:47 AM.  Always use your most recent med list.               ACCU-CHEK AVIVA test strip  Generic drug:  glucose blood  CHECK ONCE DAILY     glucose blood test strip  Use as instructed     aspirin 81 MG tablet  Take 81 mg by mouth daily.     atorvastatin 20 MG tablet  Commonly known as:  LIPITOR  Take 1 tablet (20 mg total) by mouth daily.     JANUMET 50-1000 MG per tablet  Generic drug:  sitaGLIPtin-metformin     Liraglutide 18 MG/3ML Sopn  Commonly known as:  VICTOZA  Inject 1.2 mg into the skin daily.     losartan 25 MG tablet  Commonly known as:  COZAAR  Take 1 tablet (25 mg total) by mouth daily.     nitroGLYCERIN 0.4 MG SL tablet  Commonly known as:  NITROSTAT  Place 1 tablet (0.4 mg total) under the tongue every 5 (five) minutes as needed for chest pain.     onetouch ultrasoft lancets  Use as instructed     pioglitazone 30 MG tablet  Commonly known as:  ACTOS  Take 1 tablet (30 mg total) by mouth daily.           Allergies: No Known Allergies  Past Medical History  Diagnosis Date  . Diabetes mellitus   . Hyperlipidemia   . Normal cardiac stress test 02-2011     Past Surgical History  Procedure Laterality Date  . Incise and drain abcess  07/11/11    perirectal abscess     Family History  Problem Relation Age of Onset  . Breast cancer Mother   . Hypertension Neg Hx   . Hyperlipidemia Neg Hx   . Heart attack Neg Hx   . Diabetes Neg Hx   . Colon cancer Neg Hx   . Prostate cancer Neg Hx     Social History:  reports that he quit smoking about 13 years ago. He has never used smokeless tobacco. He reports that he drinks alcohol. He reports that he does not use illicit drugs.    Review of Systems       Lipids: He has been treated with Lipitor for a few years  Lab Results  Component Value Date   LDLCALC 108* 09/02/2012         The blood pressure has been fairly normal and he is taking 25 mg losartan prophylactically, recently he is getting some headaches and dizziness     LABS:  Appointment on 02/17/2013  Component Date Value Range Status  . Fructosamine 02/17/2013 269  <285 umol/L Final   Comment:                            Variations in levels of serum proteins (albumin and immunoglobulins)                          may affect fructosamine results.                             . Sodium 02/17/2013 139  135 - 145 mEq/L Final  . Potassium 02/17/2013 4.8  3.5 - 5.1 mEq/L Final  . Chloride 02/17/2013 104  96 - 112 mEq/L Final  . CO2 02/17/2013 28  19 -  32 mEq/L Final  . Glucose, Bld 02/17/2013 104* 70 - 99 mg/dL Final  . BUN 16/02/9603 12  6 - 23 mg/dL Final  . Creatinine, Ser 02/17/2013 0.9  0.4 - 1.5 mg/dL Final  . Calcium 54/01/8118 9.1  8.4 - 10.5 mg/dL Final  . GFR 14/78/2956 98.30  >60.00 mL/min Final    Physical Examination:  BP 128/64  Pulse 70  Temp(Src) 98.5 F (36.9 C)  Resp 12  Ht 5\' 5"  (1.651 m)  Wt 166 lb 14.4 oz (75.705 kg)  BMI 27.77 kg/m2  SpO2  98%  GENERAL:         Patient has abdominal obesity.     ASSESSMENT:  Diabetes type 2, uncontrolled - 250.02    His blood sugars appear to be significantly better with simply changing his lifestyle and starting exercise regimen. He has also done well with diet and his blood sugars are surprisingly better with no change in his medications Fructosamine level is in the normal range now. His seems to have relatively higher readings in the mornings Also discussed needing to check more readings after meals. We will continue Janumet and Actos at present  Will reassess A1c in 2 months and adjust medications as needed  Hyperlipidemia: His last LDL was relatively high at 108 and HDL low at 39, will need followup and hopefully will see improvement with better diet and weight loss and exercise  Because of his orthostatic lightheadedness he can leave off losartan for now and followup with PCP   [facility] 02/21/2013, 8:47 AM

## 2013-02-21 NOTE — Patient Instructions (Addendum)
Pioglitatazone: take at night  Leave off Losartan  Please check blood sugars at least half the time about 2 hours after any meal and as directed on waking up. Please bring blood sugar monitor to each visit

## 2013-02-23 ENCOUNTER — Other Ambulatory Visit: Payer: Self-pay | Admitting: Internal Medicine

## 2013-02-24 ENCOUNTER — Other Ambulatory Visit: Payer: Self-pay | Admitting: *Deleted

## 2013-02-24 MED ORDER — GLUCOSE BLOOD VI STRP: ORAL_STRIP | Status: DC

## 2013-02-24 NOTE — Telephone Encounter (Signed)
Accu-Check strips refill sent to pharmacy

## 2013-02-25 ENCOUNTER — Other Ambulatory Visit: Payer: Self-pay | Admitting: Internal Medicine

## 2013-02-25 NOTE — Telephone Encounter (Signed)
Actos refill sent to pharmacy

## 2013-03-17 ENCOUNTER — Other Ambulatory Visit: Payer: Self-pay

## 2013-03-18 ENCOUNTER — Ambulatory Visit: Payer: BC Managed Care – PPO | Admitting: Internal Medicine

## 2013-04-05 ENCOUNTER — Ambulatory Visit
Admission: RE | Admit: 2013-04-05 | Discharge: 2013-04-05 | Disposition: A | Discharge status: Home / Self Care | Payer: BC Managed Care – PPO | Source: Ambulatory Visit | Attending: Emergency Medicine | Admitting: Emergency Medicine

## 2013-04-05 ENCOUNTER — Other Ambulatory Visit: Payer: Self-pay | Admitting: Emergency Medicine

## 2013-04-05 DIAGNOSIS — R609 Edema, unspecified: Secondary | ICD-10-CM

## 2013-04-06 ENCOUNTER — Other Ambulatory Visit: Payer: BC Managed Care – PPO

## 2013-04-20 ENCOUNTER — Other Ambulatory Visit (INDEPENDENT_AMBULATORY_CARE_PROVIDER_SITE_OTHER): Payer: BC Managed Care – PPO

## 2013-04-20 DIAGNOSIS — E785 Hyperlipidemia, unspecified: Secondary | ICD-10-CM

## 2013-04-20 LAB — COMPREHENSIVE METABOLIC PANEL
ALT: 27 U/L (ref 0–53)
AST: 27 U/L (ref 0–37)
CO2: 23 mEq/L (ref 19–32)
Calcium: 8.8 mg/dL (ref 8.4–10.5)
Chloride: 108 mEq/L (ref 96–112)
Creatinine, Ser: 0.9 mg/dL (ref 0.4–1.5)
GFR: 99.49 mL/min (ref >60.00)
Glucose, Bld: 110 mg/dL — ABNORMAL HIGH (ref 70–99)
Total Bilirubin: 0.6 mg/dL (ref 0.3–1.2)

## 2013-04-20 LAB — LDL CHOLESTEROL, DIRECT: Direct LDL: 178.1 mg/dL

## 2013-04-20 LAB — LIPID PANEL
Cholesterol: 234 mg/dL — ABNORMAL HIGH (ref 0–200)
HDL: 50.1 mg/dL (ref >39.00)
VLDL: 21.6 mg/dL (ref 0.0–40.0)

## 2013-04-20 LAB — HEMOGLOBIN A1C: Hgb A1c MFr Bld: 6.2 % (ref 4.6–6.5)

## 2013-04-22 ENCOUNTER — Encounter: Payer: Self-pay | Admitting: Endocrinology

## 2013-04-22 ENCOUNTER — Ambulatory Visit (INDEPENDENT_AMBULATORY_CARE_PROVIDER_SITE_OTHER): Payer: BC Managed Care – PPO | Admitting: Endocrinology

## 2013-04-22 VITALS — BP 120/76 | HR 82 | Temp 98.3°F | Resp 12 | Ht 65.0 in | Wt 171.1 lb

## 2013-04-22 DIAGNOSIS — IMO0001 Reserved for inherently not codable concepts without codable children: Secondary | ICD-10-CM

## 2013-04-22 DIAGNOSIS — E785 Hyperlipidemia, unspecified: Secondary | ICD-10-CM

## 2013-04-22 MED ORDER — ROSUVASTATIN CALCIUM 5 MG PO TABS: 5.0000 mg | ORAL_TABLET | Freq: Every day | ORAL | Status: DC

## 2013-04-22 NOTE — Patient Instructions (Signed)
Please check blood sugars at least half the time about 2 hours after any meal and as directed on waking up. Please bring blood sugar monitor to each visit  Crestor 5mg  daily

## 2013-04-22 NOTE — Progress Notes (Signed)
Shawn Walter  Reason for Appointment : Followup for Type 2 Diabetes  History of Present Illness          Diagnosis: Type 2 diabetes mellitus, date of diagnosis: 2004        Past history: His glucose was about 220 at diagnosis. He thinks he was not treated with medications but told to modify his diet and start exercise. Not clear how his diabetes was controlled with this regimen He thinks about 3 years ago he was started on metformin, probably when his A1c was 8.2%. Blood sugar was somewhat better but his A1c at best was 7.1% Although he had some initial improvement with metformin initially his blood sugars have been generally higher this year especially in 1/14 At some point he was also given Amaryl 4 mg but this was stopped when he was having low blood sugars Prior to his initial consultation he had been totally noncompliant with diet, exercise and glucose monitoring. He  probably gained weight also. He was on Janumet and Actos but his glucose on the lab was 348 along with A1c of 9.5  Recent history:  He is currently on Janumet maximum dose and Actos 30 mg He was recommended Victoza but because of insurance denial and his reluctance to take this he did not start this. However he has made significant changes in his diet and exercise on his own and his blood sugars have improved significantly.  He has been more motivated to exercise, previously had not done any His A1c is now the best ever Again checking blood sugar somewhat irregularly and none after supper Over the last few years his weight has ranged from 158-175    Glucose monitoring:  less than once a day       Glucometer: Accu-Chek nano    Blood Glucose readings: Fasting 87-134 with average about 102, evening before supper 87-110      Hypoglycemia frequency:  none .           Self-care:   Meals: 3 meals per day.  now eating very little rice and cutting back on portions overall but not always watching fats    Physical activity:  exercise: On treadmill 30 minutes, 5 days a week        Dietician visit: Most recent: never.         He has annual eye exams         Wt Readings from Last 3 Encounters:  04/22/13 171 lb 1.6 oz (77.61 kg)  02/21/13 166 lb 14.4 oz (75.705 kg)  01/31/13 168 lb 8 oz (76.431 kg)    Lab Results  Component Value Date   HGBA1C 6.2 04/20/2013   HGBA1C 9.5* 12/16/2012   HGBA1C 7.8* 09/02/2012   Lab Results  Component Value Date   MICROALBUR 3.9* 09/02/2012   LDLCALC 108* 09/02/2012   CREATININE 0.9 04/20/2013       Medication List       This list is accurate as of: 04/22/13  5:05 PM.  Always use your most recent med list.               aspirin 81 MG tablet  Take 81 mg by mouth daily.     glucose blood test strip  Use as instructed     ACCU-CHEK AVIVA test strip  Generic drug:  glucose blood  CHECK BLOOD SUGAR DAILY     glucose blood test strip  Commonly known as:  ACCU-CHEK AVIVA  CHECK ONCE  DAILY     JANUMET 50-1000 MG per tablet  Generic drug:  sitaGLIPtin-metformin     losartan 25 MG tablet  Commonly known as:  COZAAR  Take 1 tablet (25 mg total) by mouth daily.     nitroGLYCERIN 0.4 MG SL tablet  Commonly known as:  NITROSTAT  Place 1 tablet (0.4 mg total) under the tongue every 5 (five) minutes as needed for chest pain.     onetouch ultrasoft lancets  Use as instructed     pioglitazone 30 MG tablet  Commonly known as:  ACTOS  TAKE ONE TABLET BY MOUTH ONCE DAILY     rosuvastatin 5 MG tablet  Commonly known as:  CRESTOR  Take 1 tablet (5 mg total) by mouth daily.          Allergies: No Known Allergies  Past Medical History  Diagnosis Date  . Diabetes mellitus   . Hyperlipidemia   . Normal cardiac stress test 02-2011     Past Surgical History  Procedure Laterality Date  . Incise and drain abcess  07/11/11    perirectal abscess     Family History  Problem Relation Age of Onset  . Breast cancer Mother   . Hypertension Neg Hx   .  Hyperlipidemia Neg Hx   . Heart attack Neg Hx   . Diabetes Neg Hx   . Colon cancer Neg Hx   . Prostate cancer Neg Hx     Social History:  reports that he quit smoking about 13 years ago. He has never used smokeless tobacco. He reports that he drinks alcohol. He reports that he does not use illicit drugs.    Review of Systems       Lipids: He has been treated with Lipitor for a few years; for some reason he has stopped taking this. He now thinks that it was causing myalgia and he feels better without it. Does not appear to understand the importance of taking this and LDL is significantly high       The blood pressure is fairly normal with holding his losartan. He was feeling lightheaded with this on his last visit    LABS:  Appointment on 04/20/2013  Component Date Value Range Status  . Sodium 04/20/2013 139  135 - 145 mEq/L Final  . Potassium 04/20/2013 4.4  3.5 - 5.1 mEq/L Final  . Chloride 04/20/2013 108  96 - 112 mEq/L Final  . CO2 04/20/2013 23  19 - 32 mEq/L Final  . Glucose, Bld 04/20/2013 110* 70 - 99 mg/dL Final  . BUN 40/98/1191 13  6 - 23 mg/dL Final  . Creatinine, Ser 04/20/2013 0.9  0.4 - 1.5 mg/dL Final  . Total Bilirubin 04/20/2013 0.6  0.3 - 1.2 mg/dL Final  . Alkaline Phosphatase 04/20/2013 54  39 - 117 U/L Final  . AST 04/20/2013 27  0 - 37 U/L Final  . ALT 04/20/2013 27  0 - 53 U/L Final  . Total Protein 04/20/2013 7.9  6.0 - 8.3 g/dL Final  . Albumin 47/82/9562 3.9  3.5 - 5.2 g/dL Final  . Calcium 13/12/6576 8.8  8.4 - 10.5 mg/dL Final  . GFR 46/96/2952 99.49  >60.00 mL/min Final  . Cholesterol 04/20/2013 234* 0 - 200 mg/dL Final   ATP III Classification       Desirable:  < 200 mg/dL               Borderline High:  200 - 239 mg/dL  High:  > = 240 mg/dL  . Triglycerides 04/20/2013 108.0  0.0 - 149.0 mg/dL Final   Normal:  <960 mg/dLBorderline High:  150 - 199 mg/dL  . HDL 04/20/2013 50.10  >39.00 mg/dL Final  . VLDL 45/40/9811 21.6  0.0 - 40.0 mg/dL  Final  . Total CHOL/HDL Ratio 04/20/2013 5   Final                  Men          Women1/2 Average Risk     3.4          3.3Average Risk          5.0          4.42X Average Risk          9.6          7.13X Average Risk          15.0          11.0                      . Hemoglobin A1C 04/20/2013 6.2  4.6 - 6.5 % Final   Glycemic Control Guidelines for People with Diabetes:Non Diabetic:  <6%Goal of Therapy: <7%Additional Action Suggested:  >8%   . Direct LDL 04/20/2013 178.1   Final   Optimal:  <100 mg/dLNear or Above Optimal:  100-129 mg/dLBorderline High:  130-159 mg/dLHigh:  160-189 mg/dLVery High:  >190 mg/dL    Physical Examination:  BP 120/76  Pulse 82  Temp(Src) 98.3 F (36.8 C)  Resp 12  Ht 5\' 5"  (1.651 m)  Wt 171 lb 1.6 oz (77.61 kg)  BMI 28.47 kg/m2  SpO2 97%    ASSESSMENT:  Diabetes type 2:  His blood sugars appear to be significantly better with simply improving his diet, particularly carbohydrate intake and starting exercise regimen.  His A1c is nearly normal which is the best in several years His blood sugars have not been evaluated after his meals and discussed this again today. We will continue Janumet and Actos at the same doses  Hyperlipidemia: His  LDL is markedly high with his stopping Lipitor on his own. He is at relatively high risk for CAD because of diabetes and his ethnicity Discussed importance of statin treatment given his risk factors and difficulty getting LDL to goal with diet alone He is more motivated to try and watch his saturated fat intake and discussed common sources of this  He agrees to try low-dose Crestor 5 mg daily and this can be increased as tolerated. Given him a 30 day trial and co-pay card also He will discuss this further with his PCP  Currently off losartan because of orthostatic lightheadedness, probably will not be able to tolerate this or an ACE inhibitor. Since his diabetes is well controlled he is at low risk for nephropathy  now  Counseling time over 50% of today's 20 minute visit  Maureena Dabbs 04/22/2013, 5:05 PM

## 2013-04-27 ENCOUNTER — Other Ambulatory Visit: Payer: Self-pay | Admitting: Internal Medicine

## 2013-04-27 NOTE — Telephone Encounter (Signed)
rx refilled per protocol. DJR  

## 2013-05-13 ENCOUNTER — Ambulatory Visit: Payer: BC Managed Care – PPO | Admitting: Internal Medicine

## 2013-05-20 ENCOUNTER — Ambulatory Visit: Payer: BC Managed Care – PPO | Admitting: Internal Medicine

## 2013-05-30 ENCOUNTER — Encounter: Payer: Self-pay | Admitting: Internal Medicine

## 2013-05-30 ENCOUNTER — Ambulatory Visit (INDEPENDENT_AMBULATORY_CARE_PROVIDER_SITE_OTHER): Payer: BC Managed Care – PPO | Admitting: Internal Medicine

## 2013-05-30 VITALS — BP 109/79 | HR 72 | Temp 98.0°F | Wt 174.0 lb

## 2013-05-30 DIAGNOSIS — E785 Hyperlipidemia, unspecified: Secondary | ICD-10-CM

## 2013-05-30 DIAGNOSIS — E1165 Type 2 diabetes mellitus with hyperglycemia: Secondary | ICD-10-CM

## 2013-05-30 DIAGNOSIS — IMO0001 Reserved for inherently not codable concepts without codable children: Secondary | ICD-10-CM

## 2013-05-30 DIAGNOSIS — R197 Diarrhea, unspecified: Secondary | ICD-10-CM

## 2013-05-30 NOTE — Progress Notes (Signed)
Pre visit review using our clinic review tool, if applicable. No additional management support is needed unless otherwise documented below in the visit note. 

## 2013-05-30 NOTE — Patient Instructions (Addendum)
Please schedule labs to be drawn next month FLP, AST, ALT--- dx  Hyperlipidemia  For the diarrhea take a OTC Florastor , it isa  probiotic, one tablet daily. If within a month the diarrhea has not improved let us know.     Next visit is for a physical exam in , fasting, in 4-5  months Please make an appointment

## 2013-05-30 NOTE — Progress Notes (Signed)
   Subjective:    Patient ID: Shawn Walter, male    DOB: August 22, 1970, 43 y.o.   MRN: [id number]  HPI Routine office visit Diabetes--now under the care of endocrinology, see assessment and plan. High cholesterol--recently started on Crestor, he will follow up here regards hyperlipidemia Diarrhea--was treated w/ antibiotics For a parotid infection back on 03-2013,  infection is gone, since then he is having daily loose stools   Past Medical History  Diagnosis Date  . Diabetes mellitus   . Hyperlipidemia   . Normal cardiac stress test 02-2011    Past Surgical History  Procedure Laterality Date  . Incise and drain abcess  07/11/11    perirectal abscess      Review of Systems Denies nausea, vomiting, diarrhea. No blood in the stools. No heartburn or abdominal pain. Denies chest pain or difficulty breathing.     Objective:   Physical Exam BP 109/79  Pulse 72  Temp(Src) 98 F (36.7 C)  Wt 174 lb (78.926 kg)  SpO2 95% General -- alert, well-developed, NAD.  HEENT-- Not pale.  Lungs -- normal respiratory effort, no intercostal retractions, no accessory muscle use, and normal breath sounds.  Heart-- normal rate, regular rhythm, no murmur.  Abdomen-- Not distended, good bowel sounds,soft, non-tender. No rebound or rigidity. No mass,organomegaly.  Extremities-- no pretibial edema bilaterally  Neurologic--  alert & oriented X3. Speech normal, gait normal, strength normal in all extremities.  Psych-- Cognition and judgment appear intact. Cooperative with normal attention span and concentration. No anxious or depressed appearing.      Assessment & Plan:  Diarrhea, new issue Developed diarrhea after taking antibiotics for a parotid infection 03-2014. Has taken current diabetes medicines before the diarrhea developed   Plan: Florastor, will call in one month if he is not getting better

## 2013-05-30 NOTE — Assessment & Plan Note (Signed)
Started to statins last month, will recommend patient to come back fasting to recheck an FLP, AST, ALT

## 2013-05-30 NOTE — Assessment & Plan Note (Addendum)
Now under the care of endo,   doing better. Off ARBs due to some dizziness, BP remains normal

## 2013-05-31 ENCOUNTER — Other Ambulatory Visit: Payer: Self-pay | Admitting: Internal Medicine

## 2013-06-01 ENCOUNTER — Telehealth: Payer: Self-pay

## 2013-06-01 NOTE — Telephone Encounter (Signed)
actos refilled per protocol. JG//CMA

## 2013-06-01 NOTE — Telephone Encounter (Signed)
Relevant patient education assigned to patient using Emmi. ° °

## 2013-06-29 ENCOUNTER — Other Ambulatory Visit: Payer: Self-pay | Admitting: Internal Medicine

## 2013-07-19 ENCOUNTER — Other Ambulatory Visit: Payer: Managed Care, Other (non HMO)

## 2013-07-21 ENCOUNTER — Ambulatory Visit: Payer: Managed Care, Other (non HMO) | Admitting: Endocrinology

## 2013-07-28 ENCOUNTER — Other Ambulatory Visit: Payer: Self-pay | Admitting: Endocrinology

## 2013-08-30 ENCOUNTER — Other Ambulatory Visit: Payer: Self-pay | Admitting: Endocrinology

## 2013-08-30 ENCOUNTER — Other Ambulatory Visit: Payer: Self-pay | Admitting: Internal Medicine

## 2013-08-30 NOTE — Telephone Encounter (Signed)
Rx sent to the pharmacy by e-script.//AB/CMA 

## 2013-09-16 ENCOUNTER — Other Ambulatory Visit: Payer: Managed Care, Other (non HMO)

## 2013-09-19 ENCOUNTER — Ambulatory Visit: Payer: Managed Care, Other (non HMO) | Admitting: Endocrinology

## 2013-10-24 ENCOUNTER — Other Ambulatory Visit: Payer: BC Managed Care – PPO

## 2013-10-27 ENCOUNTER — Ambulatory Visit: Payer: Managed Care, Other (non HMO) | Admitting: Endocrinology

## 2013-10-27 ENCOUNTER — Other Ambulatory Visit: Payer: Self-pay | Admitting: Endocrinology

## 2013-10-28 ENCOUNTER — Ambulatory Visit (INDEPENDENT_AMBULATORY_CARE_PROVIDER_SITE_OTHER): Payer: Managed Care, Other (non HMO) | Admitting: Internal Medicine

## 2013-10-28 ENCOUNTER — Encounter: Payer: Self-pay | Admitting: Internal Medicine

## 2013-10-28 VITALS — BP 110/74 | HR 78 | Temp 97.9°F | Wt 176.0 lb

## 2013-10-28 DIAGNOSIS — E1165 Type 2 diabetes mellitus with hyperglycemia: Secondary | ICD-10-CM

## 2013-10-28 DIAGNOSIS — IMO0001 Reserved for inherently not codable concepts without codable children: Secondary | ICD-10-CM

## 2013-10-28 DIAGNOSIS — E785 Hyperlipidemia, unspecified: Secondary | ICD-10-CM

## 2013-10-28 LAB — COMPREHENSIVE METABOLIC PANEL
ALBUMIN: 4.1 g/dL (ref 3.5–5.2)
ALT: 29 U/L (ref 0–53)
AST: 27 U/L (ref 0–37)
Alkaline Phosphatase: 58 U/L (ref 39–117)
BUN: 12 mg/dL (ref 6–23)
CHLORIDE: 106 meq/L (ref 96–112)
CO2: 27 mEq/L (ref 19–32)
Calcium: 8.9 mg/dL (ref 8.4–10.5)
Creatinine, Ser: 0.9 mg/dL (ref 0.4–1.5)
GFR: 103.25 mL/min (ref >60.00)
Glucose, Bld: 103 mg/dL — ABNORMAL HIGH (ref 70–99)
Potassium: 4.4 mEq/L (ref 3.5–5.1)
Sodium: 138 mEq/L (ref 135–145)
Total Bilirubin: 0.7 mg/dL (ref 0.2–1.2)
Total Protein: 7.9 g/dL (ref 6.0–8.3)

## 2013-10-28 LAB — LIPID PANEL
CHOL/HDL RATIO: 3
Cholesterol: 160 mg/dL (ref 0–200)
HDL: 47 mg/dL (ref >39.00)
LDL Cholesterol: 95 mg/dL (ref 0–99)
NonHDL: 113
TRIGLYCERIDES: 89 mg/dL (ref 0.0–149.0)
VLDL: 17.8 mg/dL (ref 0.0–40.0)

## 2013-10-28 NOTE — Assessment & Plan Note (Signed)
Good compliance and tolerance with low dose of Crestor, labs.

## 2013-10-28 NOTE — Progress Notes (Signed)
   Subjective:    Patient ID: Shawn Walter, male    DOB: Mar 17, 1971, 43 y.o.   MRN: [id number]  DOS:  10/28/2013 Type of  Visit: ROV History:  In general feeling well, good compliance with medication. Started crestor  few months ago, initially had some diarrhea but now is essentially asymptomatic. Continued to do well with diet and exercise. Vision is normal. Denies any paresthesias in the lower extremities.   ROS Denies chest pain or difficulty breathing. No palpitations No nausea, vomiting or blood in the stools.  Past Medical History  Diagnosis Date  . Diabetes mellitus   . Hyperlipidemia   . Normal cardiac stress test 02-2011     Past Surgical History  Procedure Laterality Date  . Incise and drain abcess  07/11/11    perirectal abscess     History   Social History  . Marital Status: Married    Spouse Name: N/A    Number of Children: 2  . Years of Education: N/A   Occupational History  . works at [employer], [employer]    Social History Main Topics  . Smoking status: Former Smoker    Quit date: 05/13/1999  . Smokeless tobacco: Never Used  . Alcohol Use: Yes     Comment: socially   . Drug Use: No  . Sexual Activity: Not on file   Other Topics Concern  . Not on file   Social History Narrative   Mar[place], born [place]     children x 2  (2004, 2010)         Medication List       This list is accurate as of: 10/28/13 11:59 PM.  Always use your most recent med list.               aspirin 81 MG tablet  Take 81 mg by mouth daily.     CRESTOR 5 MG tablet  Generic drug:  rosuvastatin  TAKE ONE TABLET BY MOUTH ONCE DAILY     glucose blood test strip  Use as instructed     ACCU-CHEK AVIVA test strip  Generic drug:  glucose blood  CHECK BLOOD SUGAR DAILY     glucose blood test strip  Commonly known as:  ACCU-CHEK AVIVA  CHECK ONCE DAILY     JANUMET 50-1000 MG per tablet  Generic drug:  sitaGLIPtin-metformin  TAKE ONE TABLET BY MOUTH TWICE DAILY       onetouch ultrasoft lancets  Use as instructed     pioglitazone 30 MG tablet  Commonly known as:  ACTOS  TAKE ONE TABLET BY MOUTH ONCE DAILY     pioglitazone 30 MG tablet  Commonly known as:  ACTOS  Take 30 mg by mouth daily.           Objective:   Physical Exam BP 110/74  Pulse 78  Temp(Src) 97.9 F (36.6 C)  Wt 176 lb (79.833 kg)  SpO2 97% General -- alert, well-developed, NAD.  Lungs -- normal respiratory effort, no intercostal retractions, no accessory muscle use, and normal breath sounds.  Heart-- normal rate, regular rhythm, no murmur.  Extremities-- no pretibial edema bilaterally  Neurologic--  alert & oriented X3. Speech normal, gait appropriate for age, strength symmetric and appropriate for age.   Psych-- Cognition and judgment appear intact. Cooperative with normal attention span and concentration. No anxious or depressed appearing.         Assessment & Plan:

## 2013-10-28 NOTE — Progress Notes (Signed)
Pre visit review using our clinic review tool, if applicable. No additional management support is needed unless otherwise documented below in the visit note. 

## 2013-10-28 NOTE — Assessment & Plan Note (Signed)
Per endocrinology, good compliance with medications. Reports he sees the eye Dr. Edd ArbourYearly Not taking ACE ARBs due to low blood pressure

## 2013-10-28 NOTE — Patient Instructions (Signed)
Get your blood work before you leave   Next visit is for a physical exam in 6 months , fasting Please make an appointment    

## 2013-11-05 ENCOUNTER — Other Ambulatory Visit: Payer: Self-pay | Admitting: Endocrinology

## 2013-11-09 ENCOUNTER — Other Ambulatory Visit: Payer: Self-pay | Admitting: Endocrinology

## 2013-11-09 DIAGNOSIS — N209 Urinary calculus, unspecified: Secondary | ICD-10-CM | POA: Insufficient documentation

## 2013-11-09 HISTORY — DX: Urinary calculus, unspecified: N20.9

## 2013-11-10 ENCOUNTER — Emergency Department (HOSPITAL_BASED_OUTPATIENT_CLINIC_OR_DEPARTMENT_OTHER): Payer: Managed Care, Other (non HMO)

## 2013-11-10 ENCOUNTER — Emergency Department (HOSPITAL_BASED_OUTPATIENT_CLINIC_OR_DEPARTMENT_OTHER)
Admission: EM | Admit: 2013-11-10 | Discharge: 2013-11-10 | Disposition: A | Discharge status: Home / Self Care | Payer: Managed Care, Other (non HMO) | Attending: Emergency Medicine | Admitting: Emergency Medicine

## 2013-11-10 ENCOUNTER — Encounter (HOSPITAL_BASED_OUTPATIENT_CLINIC_OR_DEPARTMENT_OTHER): Payer: Self-pay | Admitting: Emergency Medicine

## 2013-11-10 ENCOUNTER — Telehealth: Payer: Self-pay | Admitting: *Deleted

## 2013-11-10 DIAGNOSIS — Z87891 Personal history of nicotine dependence: Secondary | ICD-10-CM | POA: Insufficient documentation

## 2013-11-10 DIAGNOSIS — N23 Unspecified renal colic: Secondary | ICD-10-CM

## 2013-11-10 DIAGNOSIS — R112 Nausea with vomiting, unspecified: Secondary | ICD-10-CM | POA: Insufficient documentation

## 2013-11-10 DIAGNOSIS — Z8659 Personal history of other mental and behavioral disorders: Secondary | ICD-10-CM | POA: Insufficient documentation

## 2013-11-10 DIAGNOSIS — Z79899 Other long term (current) drug therapy: Secondary | ICD-10-CM | POA: Insufficient documentation

## 2013-11-10 DIAGNOSIS — Z7982 Long term (current) use of aspirin: Secondary | ICD-10-CM | POA: Insufficient documentation

## 2013-11-10 DIAGNOSIS — E119 Type 2 diabetes mellitus without complications: Secondary | ICD-10-CM | POA: Insufficient documentation

## 2013-11-10 LAB — CBC WITH DIFFERENTIAL/PLATELET
BASOS PCT: 1 % (ref 0–1)
Basophils Absolute: 0 10*3/uL (ref 0.0–0.1)
Eosinophils Absolute: 0.1 10*3/uL (ref 0.0–0.7)
Eosinophils Relative: 1 % (ref 0–5)
HEMATOCRIT: 38.5 % — AB (ref 39.0–52.0)
Hemoglobin: 12.9 g/dL — ABNORMAL LOW (ref 13.0–17.0)
LYMPHS ABS: 2 10*3/uL (ref 0.7–4.0)
LYMPHS PCT: 23 % (ref 12–46)
MCH: 29.3 pg (ref 26.0–34.0)
MCHC: 33.5 g/dL (ref 30.0–36.0)
MCV: 87.5 fL (ref 78.0–100.0)
MONO ABS: 0.6 10*3/uL (ref 0.1–1.0)
MONOS PCT: 7 % (ref 3–12)
NEUTROS ABS: 5.9 10*3/uL (ref 1.7–7.7)
NEUTROS PCT: 68 % (ref 43–77)
Platelets: 229 10*3/uL (ref 150–400)
RBC: 4.4 MIL/uL (ref 4.22–5.81)
RDW: 13.1 % (ref 11.5–15.5)
WBC: 8.7 10*3/uL (ref 4.0–10.5)

## 2013-11-10 LAB — URINE MICROSCOPIC-ADD ON

## 2013-11-10 LAB — URINALYSIS, ROUTINE W REFLEX MICROSCOPIC
Bilirubin Urine: NEGATIVE
Glucose, UA: NEGATIVE mg/dL
Ketones, ur: NEGATIVE mg/dL
Leukocytes, UA: NEGATIVE
Nitrite: NEGATIVE
Protein, ur: NEGATIVE mg/dL
Specific Gravity, Urine: 1.024 (ref 1.005–1.030)
UROBILINOGEN UA: 0.2 mg/dL (ref 0.0–1.0)
pH: 5 (ref 5.0–8.0)

## 2013-11-10 LAB — BASIC METABOLIC PANEL
Anion gap: 15 (ref 5–15)
BUN: 18 mg/dL (ref 6–23)
CO2: 21 meq/L (ref 19–32)
Calcium: 8.9 mg/dL (ref 8.4–10.5)
Chloride: 103 mEq/L (ref 96–112)
Creatinine, Ser: 1 mg/dL (ref 0.50–1.35)
GFR calc non Af Amer: >90 mL/min (ref >90)
Glucose, Bld: 173 mg/dL — ABNORMAL HIGH (ref 70–99)
POTASSIUM: 4.1 meq/L (ref 3.7–5.3)
SODIUM: 139 meq/L (ref 137–147)

## 2013-11-10 MED ORDER — ONDANSETRON HCL 8 MG PO TABS: 8.0000 mg | ORAL_TABLET | Freq: Three times a day (TID) | ORAL | Status: DC | PRN

## 2013-11-10 MED ORDER — HYDROMORPHONE HCL PF 1 MG/ML IJ SOLN
1.0000 mg | Freq: Once | INTRAMUSCULAR | Status: AC
  Administered 2013-11-10: 1 mg via INTRAVENOUS
  Filled 2013-11-10: qty 1

## 2013-11-10 MED ORDER — OXYCODONE-ACETAMINOPHEN 5-325 MG PO TABS: 1.0000 | ORAL_TABLET | Freq: Four times a day (QID) | ORAL | Status: DC | PRN

## 2013-11-10 MED ORDER — ROSUVASTATIN CALCIUM 5 MG PO TABS: ORAL_TABLET | ORAL | Status: DC

## 2013-11-10 MED ORDER — ONDANSETRON HCL 4 MG/2ML IJ SOLN
4.0000 mg | Freq: Once | INTRAMUSCULAR | Status: AC
  Administered 2013-11-10: 4 mg via INTRAVENOUS
  Filled 2013-11-10: qty 2

## 2013-11-10 MED ORDER — IBUPROFEN 800 MG PO TABS: 800.0000 mg | ORAL_TABLET | Freq: Three times a day (TID) | ORAL | Status: DC

## 2013-11-10 NOTE — Discharge Instructions (Signed)
Kidney Stones Call Dr.Dahlstedt's office today to arrange to be seen within a week. Blood sugar today was 173 which was mildly elevated. CT scan shows a kidney stone on the left. It also shows cysts on your right kidney which is an incidental finding and nothing to worry about. If your pain is out of control after taking the medication prescribed [facility] Kidney stones (urolithiasis) are solid masses that form inside your kidneys. The intense pain is caused by the stone moving through the kidney, ureter, bladder, and urethra (urinary tract). When the stone moves, the ureter starts to spasm around the stone. The stone is usually passed in your pee (urine).  HOME CARE  Drink enough fluids to keep your pee clear or pale yellow. This helps to get the stone out.  Strain all pee through the provided strainer. Do not pee without peeing through the strainer, not even once. If you pee the stone out, catch it in the strainer. The stone may be as small as a grain of salt. Take this to your doctor. This will help your doctor figure out what you can do to try to prevent more kidney stones.  Only take medicine as told by your doctor.  Follow up with your doctor as told.  Get follow-up X-rays as told by your doctor. GET HELP IF: You have pain that gets worse even if you have been taking pain medicine. GET HELP RIGHT AWAY IF:   Your pain does not get better with medicine.  You have a fever or shaking chills.  Your pain increases and gets worse over 18 hours.  You have new belly (abdominal) pain.  You feel faint or pass out.  You are unable to pee. MAKE SURE YOU:   Understand these instructions.  Will watch your condition.  Will get help right away if you are not doing well or get worse. Document Released: 10/15/2007 Document Revised: 12/29/2012 Document Reviewed: 09/29/2012 [facility] Patient Information 2015 [employer] [place]. This information is not  intended to replace advice given to you by your health care provider. Make sure you discuss any questions you have with your health care provider.

## 2013-11-10 NOTE — ED Provider Notes (Signed)
CSN: [id number]     Arrival date & time 11/10/13  0445 History   First MD Initiated Contact with Patient 11/10/13 [phone]     Chief Complaint  Patient presents with  . Flank Pain     (Consider location/radiation/quality/duration/timing/severity/associated sxs/prior Treatment) HPI Complains of pain at left groin radiating to left flank onset 2 AM today. Symptoms accompanied by nausea, no vomiting. Pain is worse with urinating or defecating. No other associated symptoms. No fever. No treatment prior to coming here. Pain is sharp and severe. Past Medical History  Diagnosis Date  . Diabetes mellitus   . Hyperlipidemia   . Normal cardiac stress test 02-2011    Past Surgical History  Procedure Laterality Date  . Incise and drain abcess  07/11/11    perirectal abscess    Family History  Problem Relation Age of Onset  . Breast cancer Mother   . Hypertension Neg Hx   . Hyperlipidemia Neg Hx   . Heart attack Neg Hx   . Diabetes Neg Hx   . Colon cancer Neg Hx   . Prostate cancer Neg Hx    History  Substance Use Topics  . Smoking status: Former Smoker    Quit date: 05/13/1999  . Smokeless tobacco: Never Used  . Alcohol Use: Yes     Comment: socially     Review of Systems  Constitutional: Negative.   HENT: Negative.   Respiratory: Negative.   Cardiovascular: Negative.   Gastrointestinal: Positive for nausea and abdominal pain.  Genitourinary: Positive for flank pain.  Musculoskeletal: Negative.   Skin: Negative.   Neurological: Negative.   Psychiatric/Behavioral: Negative.   All other systems reviewed and are negative.     Allergies  Review of patient's allergies indicates no known allergies.  Home Medications   Prior to Admission medications   Medication Sig Start Date End Date Taking? Authorizing Provider  ACCU-CHEK AVIVA test strip CHECK BLOOD SUGAR DAILY 02/23/13   Wanda PlumpJose E Paz, MD  aspirin 81 MG tablet Take 81 mg by mouth daily.      Historical Provider, MD  CRESTOR  5 MG tablet TAKE ONE TABLET BY MOUTH ONCE DAILY 08/30/13   Reather LittlerAjay Kumar, MD  glucose blood (ACCU-CHEK AVIVA) test strip CHECK ONCE DAILY 02/24/13   Wanda PlumpJose E Paz, MD  glucose blood test strip Use as instructed 05/14/12   Wanda PlumpJose E Paz, MD  Lancets [facility](ONETOUCH ULTRASOFT) lancets Use as instructed 05/14/12   Wanda PlumpJose E Paz, MD  pioglitazone (ACTOS) 30 MG tablet Take 30 mg by mouth daily.    Historical Provider, MD  pioglitazone (ACTOS) 30 MG tablet TAKE ONE TABLET BY MOUTH ONCE DAILY 05/31/13   Wanda PlumpJose E Paz, MD  sitaGLIPtin-metformin (JANUMET) 50-1000 MG per tablet TAKE ONE TABLET BY MOUTH TWICE DAILY 08/30/13   Wanda PlumpJose E Paz, MD   BP 135/86  Pulse 71  Temp(Src) 97.8 F (36.6 C)  Resp 18  Ht 5\' 5"  (1.651 m)  Wt 175 lb (79.379 kg)  BMI 29.12 kg/m2  SpO2 100% Physical Exam  Nursing note and vitals reviewed. Constitutional: He appears well-developed and well-nourished. He appears distressed.  Appears mildly uncomfortable  HENT:  Head: Normocephalic and atraumatic.  Eyes: Conjunctivae are normal. Pupils are equal, round, and reactive to light.  Neck: Neck supple. No tracheal deviation present. No thyromegaly present.  Cardiovascular: Normal rate and regular rhythm.   No murmur heard. Pulmonary/Chest: Effort normal and breath sounds normal.  Abdominal: Soft. Bowel sounds are normal. He exhibits no distension.  There is no tenderness.  Genitourinary: Penis normal.  No flank tenderness  Musculoskeletal: Normal range of motion. He exhibits no edema and no tenderness.  Neurological: He is alert. Coordination normal.  Skin: Skin is warm and dry. No rash noted.  Psychiatric: He has a normal mood and affect.    ED Course  Procedures (including critical care time) Labs Review Labs Reviewed  URINALYSIS, ROUTINE W REFLEX MICROSCOPIC   5:20 AM patient feels improved after treatment with intravenous opioids and antiemetics. Imaging Review No results found.   EKG Interpretation None     6:10 AM patient resting  comfortably. Results for orders placed during the hospital encounter of 11/10/13  URINALYSIS, ROUTINE W REFLEX MICROSCOPIC      Result Value Ref Range   Color, Urine YELLOW  YELLOW   APPearance CLEAR  CLEAR   Specific Gravity, Urine 1.024  1.005 - 1.030   pH 5.0  5.0 - 8.0   Glucose, UA NEGATIVE  NEGATIVE mg/dL   Hgb urine dipstick LARGE (*) NEGATIVE   Bilirubin Urine NEGATIVE  NEGATIVE   Ketones, ur NEGATIVE  NEGATIVE mg/dL   Protein, ur NEGATIVE  NEGATIVE mg/dL   Urobilinogen, UA 0.2  0.0 - 1.0 mg/dL   Nitrite NEGATIVE  NEGATIVE   Leukocytes, UA NEGATIVE  NEGATIVE  BASIC METABOLIC PANEL      Result Value Ref Range   Sodium 139  137 - 147 mEq/L   Potassium 4.1  3.7 - 5.3 mEq/L   Chloride 103  96 - 112 mEq/L   CO2 21  19 - 32 mEq/L   Glucose, Bld 173 (*) 70 - 99 mg/dL   BUN 18  6 - 23 mg/dL   Creatinine, Ser 1.61  0.50 - 1.35 mg/dL   Calcium 8.9  8.4 - 09.6 mg/dL   GFR calc non Af Amer >90  >90 mL/min   GFR calc Af Amer >90  >90 mL/min   Anion gap 15  5 - 15  CBC WITH DIFFERENTIAL      Result Value Ref Range   WBC 8.7  4.0 - 10.5 K/uL   RBC 4.40  4.22 - 5.81 MIL/uL   Hemoglobin 12.9 (*) 13.0 - 17.0 g/dL   HCT 04.5 (*) 40.9 - 81.1 %   MCV 87.5  78.0 - 100.0 fL   MCH 29.3  26.0 - 34.0 pg   MCHC 33.5  30.0 - 36.0 g/dL   RDW 91.4  78.2 - 95.6 %   Platelets 229  150 - 400 K/uL   Neutrophils Relative % 68  43 - 77 %   Neutro Abs 5.9  1.7 - 7.7 K/uL   Lymphocytes Relative 23  12 - 46 %   Lymphs Abs 2.0  0.7 - 4.0 K/uL   Monocytes Relative 7  3 - 12 %   Monocytes Absolute 0.6  0.1 - 1.0 K/uL   Eosinophils Relative 1  0 - 5 %   Eosinophils Absolute 0.1  0.0 - 0.7 K/uL   Basophils Relative 1  0 - 1 %   Basophils Absolute 0.0  0.0 - 0.1 K/uL  URINE MICROSCOPIC-ADD ON      Result Value Ref Range   Squamous Epithelial / LPF RARE  RARE   WBC, UA 0-2  <3 WBC/hpf   RBC / HPF 7-10  <3 RBC/hpf   Bacteria, UA RARE  RARE   Ct Abdomen Pelvis Wo Contrast  11/10/2013   CLINICAL  DATA:  Left flank and  groin pain for 3 hr.  EXAM: CT ABDOMEN AND PELVIS WITHOUT CONTRAST  TECHNIQUE: Multidetector CT imaging of the abdomen and pelvis was performed following the standard protocol without IV contrast.  COMPARISON:  None.  FINDINGS: BODY WALL: Unremarkable.  LOWER CHEST: Unremarkable.  ABDOMEN/PELVIS:  Liver: Mild hepatic steatosis.  No focal abnormality.  Biliary: No evidence of biliary obstruction or stone.  Pancreas: Unremarkable.  Spleen: Unremarkable.  Adrenals: Unremarkable.  Kidneys and ureters: There is mild left hydronephrosis and mild left perinephric edema secondary to a 3 mm stone at the left ureteral vesicular junction. There is also a punctate calculus in the upper pole left kidney. At least 3 low dense renal lesions in the right kidney compatible with cysts. One of these has linear, likely layering calcification suggesting milk of calcium.  Bladder: Unremarkable.  Reproductive: Unremarkable.  Bowel: No obstruction. Normal appendix.  Retroperitoneum: No mass or adenopathy.  Peritoneum: No ascites or pneumoperitoneum.  Vascular: No acute abnormality.  OSSEOUS: Right lateral recess stenosis at L4-5 secondary to disc bulging with annular calcification.  IMPRESSION: 1. 3 mm distal left ureteral calculus with mild obstructive changes. 2. Tiny left upper pole calculus. 3. Right renal cysts, with one containing milk of calcium.   Electronically Signed   By: Tiburcio PeaJonathan  Watts M.D.   On: 11/10/2013 06:02    MDM  Plan prescription ibuprofen, Percocet, Zofran Referral Dr.Dahlstedt Diagnosis #1 ureteral colic #2 hyperglycemia  Final diagnoses:  None        Doug SouSam Gwendloyn Forsee, MD 11/10/13 [phone]

## 2013-11-10 NOTE — ED Notes (Signed)
Pt c/o lower back and lower abd pain onset 2am with painful urination.

## 2013-11-10 NOTE — Telephone Encounter (Signed)
rx refilled.

## 2013-11-10 NOTE — ED Notes (Signed)
Wife to desk expressing concern and wants MD to check pt's prostate. Dr. Ethelda ChickJacubowitz made aware of wife's concern.

## 2013-11-10 NOTE — Telephone Encounter (Signed)
Caller name:  Seferino Relation to pt:  self Call back number: [phone]  [facility]  Reason for call:   Pt called to request refill on  CRESTOR 5 MG tablet  Last filled 08/30/13, #30, 1 refill Last OV 10/28/13

## 2013-12-05 ENCOUNTER — Other Ambulatory Visit: Payer: Self-pay | Admitting: Internal Medicine

## 2014-01-02 ENCOUNTER — Other Ambulatory Visit: Payer: Self-pay | Admitting: Physician Assistant

## 2014-01-02 ENCOUNTER — Other Ambulatory Visit: Payer: Self-pay | Admitting: Internal Medicine

## 2014-01-02 NOTE — Telephone Encounter (Signed)
Patient of Dr. Willow Ora, MD; forward to MA/SLS

## 2014-01-05 ENCOUNTER — Other Ambulatory Visit: Payer: Self-pay

## 2014-01-05 ENCOUNTER — Telehealth: Payer: Self-pay

## 2014-01-05 MED ORDER — PIOGLITAZONE HCL 30 MG PO TABS: ORAL_TABLET | ORAL | Status: DC

## 2014-01-05 NOTE — Telephone Encounter (Signed)
Shawn Walter [id number] [facility]  [place] called to check on refill for pioglitazone (ACTOS) 30 MG tablet, pharmacy sent request a few days ago we filled one prescription but not this one. He is completely out now

## 2014-01-05 NOTE — Telephone Encounter (Signed)
Schedule appointment for 01-26-14 8:30am

## 2014-01-05 NOTE — Telephone Encounter (Signed)
Medication sent to [facility] pharmacy on [address] # 30 and 1 Refill. Patient will need to make an appt for any further refills after this.

## 2014-01-26 ENCOUNTER — Encounter: Payer: Self-pay | Admitting: Internal Medicine

## 2014-01-26 ENCOUNTER — Ambulatory Visit (INDEPENDENT_AMBULATORY_CARE_PROVIDER_SITE_OTHER): Payer: Managed Care, Other (non HMO) | Admitting: Internal Medicine

## 2014-01-26 VITALS — BP 124/78 | HR 66 | Temp 98.1°F | Wt 179.4 lb

## 2014-01-26 DIAGNOSIS — E1165 Type 2 diabetes mellitus with hyperglycemia: Secondary | ICD-10-CM

## 2014-01-26 DIAGNOSIS — Z23 Encounter for immunization: Secondary | ICD-10-CM

## 2014-01-26 DIAGNOSIS — IMO0001 Reserved for inherently not codable concepts without codable children: Secondary | ICD-10-CM

## 2014-01-26 DIAGNOSIS — N201 Calculus of ureter: Secondary | ICD-10-CM

## 2014-01-26 DIAGNOSIS — E785 Hyperlipidemia, unspecified: Secondary | ICD-10-CM

## 2014-01-26 LAB — BASIC METABOLIC PANEL
BUN: 17 mg/dL (ref 6–23)
CO2: 27 mEq/L (ref 19–32)
CREATININE: 0.9 mg/dL (ref 0.4–1.5)
Calcium: 9.1 mg/dL (ref 8.4–10.5)
Chloride: 104 mEq/L (ref 96–112)
GFR: 95.41 mL/min (ref >60.00)
Glucose, Bld: 120 mg/dL — ABNORMAL HIGH (ref 70–99)
POTASSIUM: 4.4 meq/L (ref 3.5–5.1)
Sodium: 137 mEq/L (ref 135–145)

## 2014-01-26 MED ORDER — ROSUVASTATIN CALCIUM 5 MG PO TABS: ORAL_TABLET | ORAL | Status: DC

## 2014-01-26 MED ORDER — SITAGLIPTIN PHOS-METFORMIN HCL 50-1000 MG PO TABS: ORAL_TABLET | ORAL | Status: DC

## 2014-01-26 MED ORDER — PIOGLITAZONE HCL 30 MG PO TABS: ORAL_TABLET | ORAL | Status: DC

## 2014-01-26 NOTE — Assessment & Plan Note (Addendum)
Medications RF, followup 6 months for a physical

## 2014-01-26 NOTE — Progress Notes (Signed)
Pre visit review using our clinic review tool, if applicable. No additional management support is needed unless otherwise documented below in the visit note. 

## 2014-01-26 NOTE — Assessment & Plan Note (Signed)
2 months ago went to the ER, diagnosed with urolithiasis, subsequently was followed by urology, they're planning to re-do a CT ~ 02-2014 Plan-- BMP to be sure her kidney function is preserved

## 2014-01-26 NOTE — Patient Instructions (Signed)
Get your blood work before you leave     Please come back to the office in 6 months  for a physical exam. Come back fasting   

## 2014-01-26 NOTE — Progress Notes (Signed)
Subjective:    Patient ID: Shawn Walter, male    DOB: 1971/03/27, 43 y.o.   MRN: [id number]  DOS:  01/26/2014 Type of visit - description : rov Interval history: Status post   ER visit  due to urolithiasis, see assessment and plan. Diabetes, needs refill on the medications, due to see Dr. Lucianne Muss High cholesterol, needs a refill of his medications.    ROS Denies fever or chills No recent nausea, vomiting, diarrhea or flank/abdominal pain  Past Medical History  Diagnosis Date  . Diabetes mellitus   . Hyperlipidemia   . Normal cardiac stress test 02-2011   . Urolithiasis 11-2013    L    Past Surgical History  Procedure Laterality Date  . Incise and drain abcess  07/11/11    perirectal abscess     History   Social History  . Marital Status: Married    Spouse Name: N/A    Number of Children: 2  . Years of Education: N/A   Occupational History  . works at [employer], Arts administrator    Social History Main Topics  . Smoking status: Former Smoker    Quit date: 05/13/1999  . Smokeless tobacco: Never Used  . Alcohol Use: Yes     Comment: socially   . Drug Use: No  . Sexual Activity: Not on file   Other Topics Concern  . Not on file   Social History Narrative   Married, born in [place]     children x 2  (2004, 2010)         Medication List       This list is accurate as of: 01/26/14  2:06 PM.  Always use your most recent med list.               ACCU-CHEK AVIVA test strip  Generic drug:  glucose blood  CHECK BLOOD SUGAR DAILY     glucose blood test strip  Commonly known as:  ACCU-CHEK AVIVA  CHECK ONCE DAILY     aspirin 81 MG tablet  Take 81 mg by mouth daily.     ibuprofen 800 MG tablet  Commonly known as:  ADVIL,MOTRIN  Take 1 tablet (800 mg total) by mouth 3 (three) times daily.     ondansetron 8 MG tablet  Commonly known as:  ZOFRAN  Take 1 tablet (8 mg total) by mouth every 8 (eight) hours as needed for nausea or vomiting.     onetouch ultrasoft  lancets  Use as instructed     oxyCODONE-acetaminophen 5-325 MG per tablet  Commonly known as:  PERCOCET  Take 1-2 tablets by mouth every 6 (six) hours as needed for severe pain.     pioglitazone 30 MG tablet  Commonly known as:  ACTOS  TAKE ONE TABLET BY MOUTH ONCE DAILY     rosuvastatin 5 MG tablet  Commonly known as:  CRESTOR  TAKE ONE TABLET BY MOUTH ONCE DAILY     sitaGLIPtin-metformin 50-1000 MG per tablet  Commonly known as:  JANUMET  TAKE ONE TABLET BY MOUTH TWICE DAILY           Objective:   Physical Exam BP 124/78  Pulse 66  Temp(Src) 98.1 F (36.7 C) (Oral)  Wt 179 lb 6 oz (81.364 kg)  SpO2 99% General -- alert, well-developed, NAD.   Lungs -- normal respiratory effort, no intercostal retractions, no accessory muscle use, and normal breath sounds.  Heart-- normal rate, regular rhythm, no murmur.  Extremities-- no  pretibial edema bilaterally  Neurologic--  alert & oriented X3. Speech normal, gait appropriate for age, strength symmetric and appropriate for age.   Psych-- Cognition and judgment appear intact. Cooperative with normal attention span and concentration. No anxious or depressed appearing.     Assessment & Plan:

## 2014-01-26 NOTE — Assessment & Plan Note (Signed)
Followup by endocrinology. Plan: Refill medications #90, no RF Patient will schedule an appointment with Dr. Lucianne Muss

## 2014-02-02 ENCOUNTER — Other Ambulatory Visit: Payer: Self-pay

## 2014-02-02 ENCOUNTER — Telehealth: Payer: Self-pay

## 2014-02-02 MED ORDER — PIOGLITAZONE HCL 30 MG PO TABS: ORAL_TABLET | ORAL | Status: DC

## 2014-02-02 MED ORDER — SITAGLIPTIN PHOS-METFORMIN HCL 50-1000 MG PO TABS: ORAL_TABLET | ORAL | Status: DC

## 2014-02-02 MED ORDER — ROSUVASTATIN CALCIUM 5 MG PO TABS: ORAL_TABLET | ORAL | Status: DC

## 2014-02-02 NOTE — Telephone Encounter (Signed)
[facility] [address]

## 2014-02-02 NOTE — Telephone Encounter (Signed)
Kolin Erdahl Self [phone] [facility] [address]  [place] [facility] so that [facility] can get 90 day supply. [facility] because [facility] want pay 90 days at [facility]

## 2014-07-14 ENCOUNTER — Telehealth: Payer: Self-pay | Admitting: Internal Medicine

## 2014-07-14 ENCOUNTER — Encounter: Payer: Self-pay | Admitting: Internal Medicine

## 2014-07-14 ENCOUNTER — Ambulatory Visit (INDEPENDENT_AMBULATORY_CARE_PROVIDER_SITE_OTHER): Payer: Managed Care, Other (non HMO) | Admitting: Internal Medicine

## 2014-07-14 VITALS — BP 128/68 | HR 110 | Temp 100.0°F | Wt 177.5 lb

## 2014-07-14 DIAGNOSIS — J020 Streptococcal pharyngitis: Secondary | ICD-10-CM

## 2014-07-14 DIAGNOSIS — R5081 Fever presenting with conditions classified elsewhere: Secondary | ICD-10-CM

## 2014-07-14 LAB — POCT RAPID STREP A (OFFICE): Rapid Strep A Screen: POSITIVE — AB

## 2014-07-14 LAB — POCT INFLUENZA A/B
INFLUENZA A, POC: NEGATIVE
INFLUENZA B, POC: NEGATIVE

## 2014-07-14 MED ORDER — AMOXICILLIN 500 MG PO CAPS: 1000.0000 mg | ORAL_CAPSULE | Freq: Two times a day (BID) | ORAL | Status: DC

## 2014-07-14 NOTE — Progress Notes (Signed)
Subjective:    Patient ID: Shawn Walter, male    DOB: 1970-08-24, 44 y.o.   MRN: [id number]  DOS:  07/14/2014 Type of visit - description : acute, here w/ his wife who is a nurse Interval history: Symptoms started 2 days ago with fever up to 102.9, decrease w/ tylenol or Motrin. Today he developed sore throat. No other family members affected but some coworkers have been sick. Also, wife reported that for the last few months has noted his parotid glands look enlarged. Has no symptoms. Has gained weight.   Review of Systems Denies difficulty breathing, anterior chest pain with deep breaths?. No nausea vomiting No rash, + body aches, mild frontal headache.   Past Medical History  Diagnosis Date  . Diabetes mellitus   . Hyperlipidemia   . Normal cardiac stress test 02-2011   . Urolithiasis 11-2013    L    Past Surgical History  Procedure Laterality Date  . Incise and drain abcess  07/11/11    perirectal abscess     History   Social History  . Marital Status: Married    Spouse Name: N/A  . Number of Children: 2  . Years of Education: N/A   Occupational History  . works at [employer], Arts administrator    Social History Main Topics  . Smoking status: Former Smoker    Quit date: 05/13/1999  . Smokeless tobacco: Never Used  . Alcohol Use: Yes     Comment: socially   . Drug Use: No  . Sexual Activity: Not on file   Other Topics Concern  . Not on file   Social History Narrative   Married, born in [place]     children x 2  (2004, 2010)         Medication List       This list is accurate as of: 07/14/14 11:59 PM.  Always use your most recent med list.               ACCU-CHEK AVIVA test strip  Generic drug:  glucose blood  CHECK BLOOD SUGAR DAILY     glucose blood test strip  Commonly known as:  ACCU-CHEK AVIVA  CHECK ONCE DAILY     amoxicillin 500 MG capsule  Commonly known as:  AMOXIL  Take 2 capsules (1,000 mg total) by mouth 2 (two) times daily.     aspirin  81 MG tablet  Take 81 mg by mouth daily.     ibuprofen 800 MG tablet  Commonly known as:  ADVIL,MOTRIN  Take 1 tablet (800 mg total) by mouth 3 (three) times daily.     ondansetron 8 MG tablet  Commonly known as:  ZOFRAN  Take 1 tablet (8 mg total) by mouth every 8 (eight) hours as needed for nausea or vomiting.     onetouch ultrasoft lancets  Use as instructed     pioglitazone 30 MG tablet  Commonly known as:  ACTOS  TAKE ONE TABLET BY MOUTH ONCE DAILY     rosuvastatin 5 MG tablet  Commonly known as:  CRESTOR  TAKE ONE TABLET BY MOUTH ONCE DAILY     sitaGLIPtin-metformin 50-1000 MG per tablet  Commonly known as:  JANUMET  TAKE ONE TABLET BY MOUTH TWICE DAILY           Objective:   Physical Exam BP 128/68 mmHg  Pulse 110  Temp(Src) 100 F (37.8 C) (Oral)  Wt 177 lb 8 oz (80.513 kg)  SpO2 97%  General:   Well developed, well nourished . NAD. Nontoxic appearing. HEENT:  Normocephalic . Face symmetric, atraumatic. Sinuses nontender, probably glands are noticeable, soft, symmetric, not nodular, no TTP. Tympanic membranes normal Throat symmetric, uvula midline, mild redness, no discharge Lungs:  CTA B Normal respiratory effort, no intercostal retractions, no accessory muscle use. Heart: RRR,  no murmur.  Muscle skeletal: no pretibial edema bilaterally  Skin: Not pale. Not jaundice Neurologic:  alert & oriented X3.  Speech normal, gait appropriate for age and unassisted Psych--  Cognition and judgment appear intact.  Cooperative with normal attention span and concentration.  Behavior appropriate. No anxious or depressed appearing.       Assessment & Plan:    Pharyngitis, Presents with fever, sore throat, flu test negative, strep test positive. Plan: Amoxicillin, rest, fluids. Unclear why he has anterior chest pain with deep breaths but recommend close observation.  Enlargement of parotid glands? Other than being quite noticeable, his parotid glands feel  normal to palpation. Recommend to come back in 4 months for a checkup.  Needs to  see Dr. Lucianne MussKumar, encouraged to call.

## 2014-07-14 NOTE — Progress Notes (Signed)
Pre visit review using our clinic review tool, if applicable. No additional management support is needed unless otherwise documented below in the visit note. 

## 2014-07-14 NOTE — Telephone Encounter (Signed)
[facility] Day - Client TELEPHONE ADVICE RECORD [facility] DOB: [birth year] Initial Comment Caller States husband has temp 102.2 and body pains. he has appt today to see the dr. at 2pm Nurse Assessment Nurse: Apolinar JunesBrandon, RN, Darl PikesSusan Date/Time Lamount Cohen(Eastern Time): 07/14/2014 10:17:59 AM Confirm and document reason for call. If symptomatic, describe symptoms. ---Caller States husband has temp 102.2 and body pains - she gave Tylenol and he no longer has a fever - fever started Wednesday - has sore throat - he has had Advil also for body aches - just really hurts all over chills and fever -he has appt today to see the dr. at 2pm - states he is diabetic and has not checked his blood sugar in a few days - had her check while on the phone with me and it was 117 that was 20 mins after breakfast - he is on Janumet Has the patient traveled out of the country within the last 30 days? ---Not Applicable Does the patient require triage? ---Yes Related visit to physician within the last 2 weeks? ---No Does the PT have any chronic conditions? (i.e. diabetes, asthma, etc.) ---Yes List chronic conditions. ---diabetic cholesterol meds baby asa Guidelines Guideline Title Affirmed Question Affirmed Notes Influenza - Seasonal [1] Fever > 100.5 F (38.1 C) AND [2] diabetes mellitus or weak immune system (e.g., HIV positive, cancer chemo, splenectomy, organ transplant, chronic steroids) Final Disposition User See Physician within 4 Hours (or PCP triage) Apolinar JunesBrandon, RN, Darl PikesSusan Comments Pts has appointment at 2:45 today - that was first available when they can see his doctor - his wife will be bringing him

## 2014-07-14 NOTE — Patient Instructions (Signed)
Rest, drink plenty of fluids For fever or aches: Take Tylenol alternating with Motrin. Take amoxicillin for 10 days  Call if not gradually improving Call anytime if you have worse, you have a rash, high fever or increased pain.  Please come back in 4 months for a checkup

## 2014-07-14 NOTE — Telephone Encounter (Signed)
Confirmed- patient has appointment with Dr. Drue NovelPaz at 2:45 today.

## 2014-08-01 ENCOUNTER — Other Ambulatory Visit: Payer: Self-pay | Admitting: Internal Medicine

## 2014-08-28 LAB — BASIC METABOLIC PANEL
Creatinine: 0.7 mg/dL (ref <=1.3)
Glucose: 165 mg/dL
POTASSIUM: 4.5 mmol/L (ref 3.4–5.3)
SODIUM: 130 mmol/L — AB (ref 137–147)

## 2014-08-28 LAB — HEMOGLOBIN A1C: Hgb A1c MFr Bld: 6 % (ref 4.0–6.0)

## 2014-08-28 LAB — CBC AND DIFFERENTIAL
Hemoglobin: 12.7 g/dL — AB (ref 13.5–17.5)
PLATELETS: 296 10*3/uL (ref 150–399)

## 2014-08-28 LAB — PSA: PSA: 2.78

## 2014-08-29 LAB — CBC AND DIFFERENTIAL: WBC: 7.6 10^3/mL

## 2014-09-01 ENCOUNTER — Other Ambulatory Visit: Payer: Self-pay | Admitting: Internal Medicine

## 2014-09-12 ENCOUNTER — Telehealth: Payer: Self-pay | Admitting: Internal Medicine

## 2014-09-12 ENCOUNTER — Other Ambulatory Visit: Payer: Self-pay

## 2014-09-12 MED ORDER — GLUCOSE BLOOD VI STRP: ORAL_STRIP | Status: DC

## 2014-09-12 NOTE — Addendum Note (Signed)
Addended by: Dorette GrateFAULKNER, Danayah Smyre C on: 09/12/2014 04:44 PM   Modules accepted: Orders, Medications

## 2014-09-12 NOTE — Telephone Encounter (Signed)
Pt states insurance will not cover previous request and requesting one touch ultra blue test strips. [facility] [phone]

## 2014-09-12 NOTE — Telephone Encounter (Signed)
One Touch Ultra Blue [facility] Strips [facility].

## 2014-09-12 NOTE — Telephone Encounter (Signed)
[facility].

## 2014-09-12 NOTE — Telephone Encounter (Signed)
Relation to pt: self  Call back number: [phone] [id number] [place],  - [address] [phone])         Reason for call:  PT requesting a refill glucose blood (ACCU-CHEK AVIVA) test strip

## 2014-09-12 NOTE — Telephone Encounter (Signed)
error:[id number] ° °

## 2014-09-15 ENCOUNTER — Telehealth: Payer: Self-pay | Admitting: *Deleted

## 2014-09-15 ENCOUNTER — Other Ambulatory Visit: Payer: Self-pay

## 2014-09-15 NOTE — Telephone Encounter (Signed)
Spoke with patient's wife regarding appointment-   Patient had lower back pain, he took ibuprofen.  Then, patient started having fever- wife gave him tylenol.  Patient began having urinary urgency and frequency so wife took him to the hospital and he was diagnosed with epididymus orchitis.  He was admitted for 8 days of IV antibiotics (did not stay in hospital the whole time, was allowed to come just for infusions).    He was also discharged with PO Cipro and has now completed this.    His wife states she will be coming with him to appointment and they have all records of hospitalization with them.

## 2014-09-18 ENCOUNTER — Encounter: Payer: Self-pay | Admitting: Internal Medicine

## 2014-09-18 ENCOUNTER — Ambulatory Visit (INDEPENDENT_AMBULATORY_CARE_PROVIDER_SITE_OTHER): Payer: Managed Care, Other (non HMO) | Admitting: Internal Medicine

## 2014-09-18 VITALS — BP 118/68 | HR 60 | Temp 98.5°F | Ht 65.0 in | Wt 171.2 lb

## 2014-09-18 DIAGNOSIS — N10 Acute tubulo-interstitial nephritis: Secondary | ICD-10-CM | POA: Diagnosis not present

## 2014-09-18 DIAGNOSIS — N390 Urinary tract infection, site not specified: Secondary | ICD-10-CM | POA: Insufficient documentation

## 2014-09-18 DIAGNOSIS — N451 Epididymitis: Secondary | ICD-10-CM

## 2014-09-18 LAB — URINALYSIS, ROUTINE W REFLEX MICROSCOPIC
BILIRUBIN URINE: NEGATIVE
Hgb urine dipstick: NEGATIVE
KETONES UR: NEGATIVE
Leukocytes, UA: NEGATIVE
Nitrite: NEGATIVE
Total Protein, Urine: NEGATIVE
UROBILINOGEN UA: 0.2 (ref 0.0–1.0)
Urine Glucose: NEGATIVE
pH: 5.5 (ref 5.0–8.0)

## 2014-09-18 MED ORDER — CIPROFLOXACIN HCL 500 MG PO TABS: 500.0000 mg | ORAL_TABLET | Freq: Two times a day (BID) | ORAL | Status: DC

## 2014-09-18 MED ORDER — ROSUVASTATIN CALCIUM 5 MG PO TABS: 5.0000 mg | ORAL_TABLET | Freq: Every day | ORAL | Status: DC

## 2014-09-18 NOTE — Progress Notes (Signed)
Subjective:    Patient ID: Shawn Walter, male    DOB: 02/15/1971, 44 y.o.   MRN: [id number]  DOS:  09/18/2014 Type of visit - description : Hospital follow-up, here with his wife Interval history: Patient was admitted to hospital in [place] from April 18 to April 22. He presented with dysuria, fever, chills, back pain, suprapubic pain and left testicular swelling. He was diagnosed with epididymo-orchitis  and a UTI. Was treated with 8 days of IV antibiotics and 2 days of by mouth antibiotics. Was recommended to see urology as an outpatient for a follow-up.  Available records reviewed:  Urinalysis, white blood cells 20-25, rbc's 8-10. Hemoglobin 12.7 Abdominal ultrasound normal gallbladder, pancreas, kidneys bladders and prostate per report Chest x-ray negative. EKG sinus tachycardia Urine culture Escherichia coli pansensitive Creatinine 0.7, potassium 4.5. A1c 6.0.  PSA 2.78. Ultrasound: Septated left hydrocele, left epididymoorchitis Echocardiogram,  Normal   Review of Systems Since he left the hospital he is doing well. No fever chills No nausea, vomiting, diarrhea. No dysuria, gross hematuria difficulty urinating.  Past Medical History  Diagnosis Date  . Diabetes mellitus   . Hyperlipidemia   . Normal cardiac stress test 02-2011   . Urolithiasis 11-2013    L    Past Surgical History  Procedure Laterality Date  . Incise and drain abcess  07/11/11    perirectal abscess     History   Social History  . Marital Status: Married    Spouse Name: N/A  . Number of Children: 2  . Years of Education: N/A   Occupational History  . works at [employer], [employer]    Social History Main Topics  . Smoking status: Former Smoker    Quit date: 05/13/1999  . Smokeless tobacco: Never Used  . Alcohol Use: Yes     Comment: socially   . Drug Use: No  . Sexual Activity: Not on file   Other Topics Concern  . Not on file   Social History Narrative   Married, born in [place]     children x 2  (2004, 2010)         Medication List       This list is accurate as of: 09/18/14  6:33 PM.  Always use your most recent med list.               aspirin 81 MG tablet  Take 81 mg by mouth daily.     ciprofloxacin 500 MG tablet  Commonly known as:  CIPRO  Take 1 tablet (500 mg total) by mouth 2 (two) times daily.     glucose blood test strip  Check blood sugar no more than twice daily.     ibuprofen 800 MG tablet  Commonly known as:  ADVIL,MOTRIN  Take 1 tablet (800 mg total) by mouth 3 (three) times daily.     onetouch ultrasoft lancets  Use as instructed     pioglitazone 30 MG tablet  Commonly known as:  ACTOS  Take 1 tablet (30 mg total) by mouth daily.     rosuvastatin 5 MG tablet  Commonly known as:  CRESTOR  Take 1 tablet (5 mg total) by mouth daily.     sitaGLIPtin-metformin 50-1000 MG per tablet  Commonly known as:  JANUMET  Take 1 tablet by mouth 2 (two) times daily. OVERDUE FOR APPT WITH DR Hebert Dooling, NO FURTHER REFILLS [phone]           Objective:   Physical Exam BP  118/68 mmHg  Pulse 60  Temp(Src) 98.5 F (36.9 C) (Oral)  Ht 5\' 5"  (1.651 m)  Wt 171 lb 3 oz (77.65 kg)  BMI 28.49 kg/m2  SpO2 99%  General:   Well developed, well nourished . NAD.  HEENT:  Normocephalic . Face symmetric, atraumatic Lungs:  CTA B Normal respiratory effort, no intercostal retractions, no accessory muscle use. Heart: RRR,  no murmur.  No pretibial edema bilaterally  Abdomen--soft, nontender, nondistended GU: Penis normal. Right testicle normal, left testicle slightly larger compared to the right and slightly TTP. Skin: Not pale. Not jaundice Neurologic:  alert & oriented X3.  Speech normal, gait appropriate for age and unassisted Psych--  Cognition and judgment appear intact.  Cooperative with normal attention span and concentration.  Behavior appropriate. No anxious or depressed appearing.       Assessment & Plan:     Diabetes, Recommend  to discuss further care with Dr. Lucianne MussKumar

## 2014-09-18 NOTE — Assessment & Plan Note (Signed)
UTI: UTI, left epididymoorchitis, status post [place] , had 10 days of antibiotics. On exam is still slightly tender at the testicle Urology referral was suggested. Plan:  Complete 2 weeks of antibiotics, will call Cipro for 4 days. Urology referral Call if any symptoms resurface  Urine culture today.

## 2014-09-18 NOTE — Progress Notes (Signed)
Pre visit review using our clinic review tool, if applicable. No additional management support is needed unless otherwise documented below in the visit note. 

## 2014-09-18 NOTE — Patient Instructions (Signed)
[facility]

## 2014-09-20 LAB — URINE CULTURE

## 2014-12-02 ENCOUNTER — Other Ambulatory Visit: Payer: Self-pay | Admitting: Internal Medicine

## 2015-02-08 IMAGING — US US SOFT TISSUE HEAD/NECK
1 series · 14 of 25 positions shown · non-contrast
Comparison: None.

CLINICAL DATA: Swelling of the left parotid gland with pain

EXAM:
ULTRASOUND OF HEAD/NECK SOFT TISSUES
TECHNIQUE: Ultrasound examination of the head and neck soft tissues was
performed in the area of clinical concern.

[Series 1: us soft tissue head/neck · 0.15mm/px · 14 of 26 slices shown]
[im 1/26]
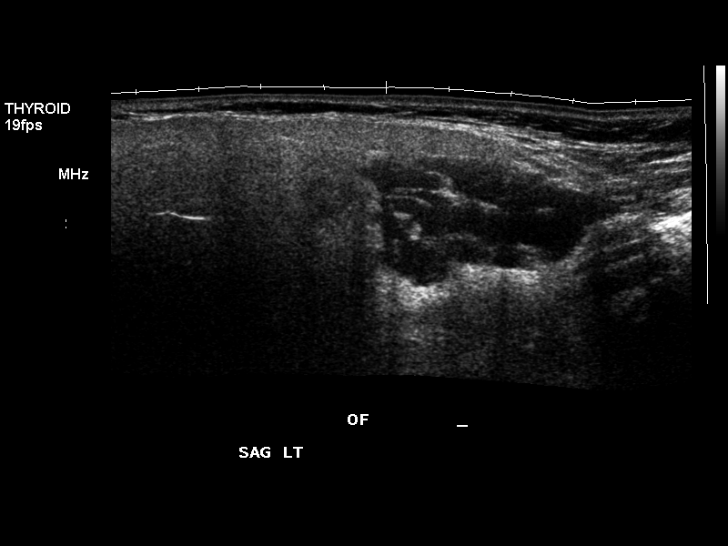
[im 3/26]
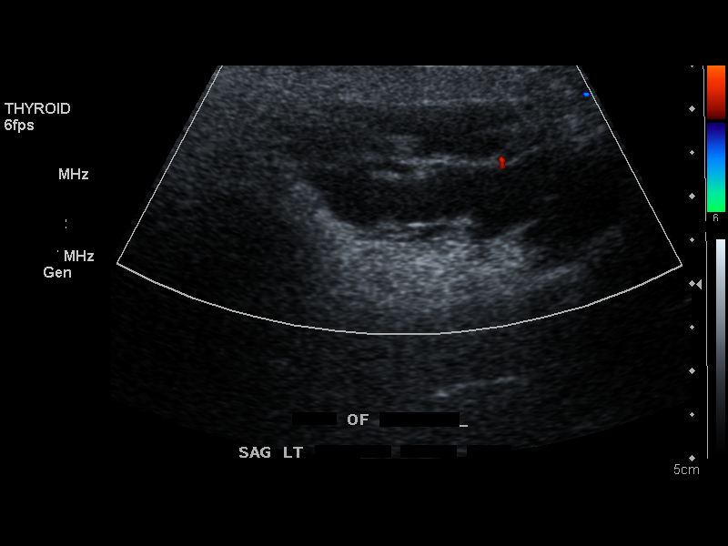
[im 5/26]
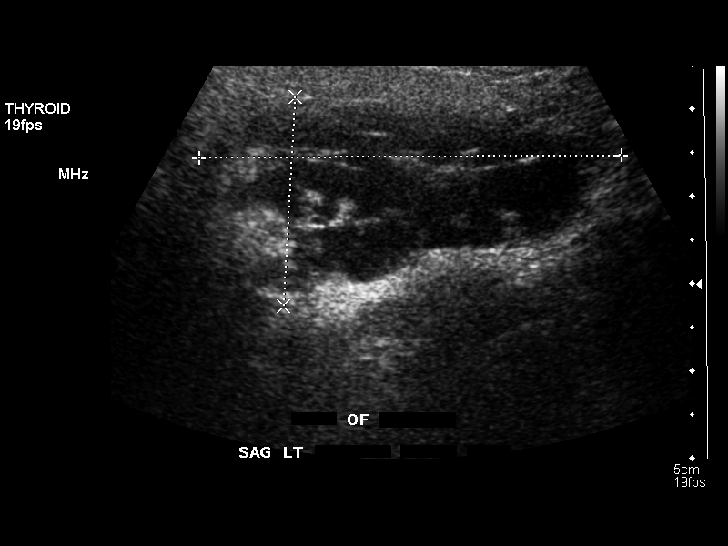
[im 7/26]
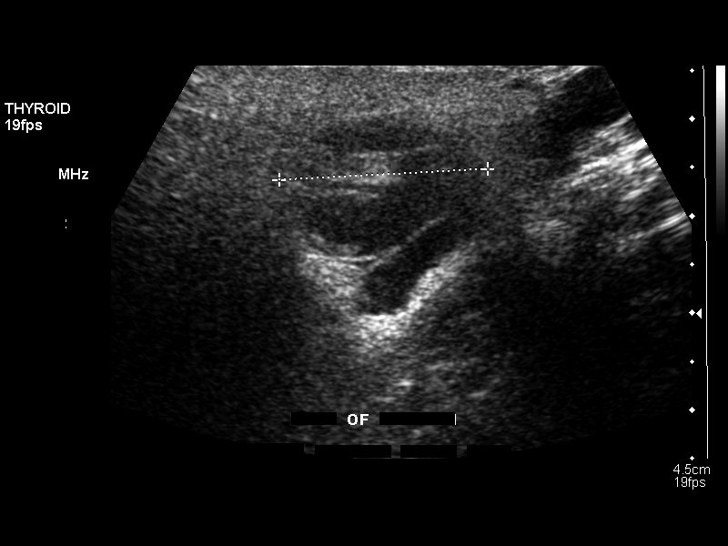
[im 9/26]
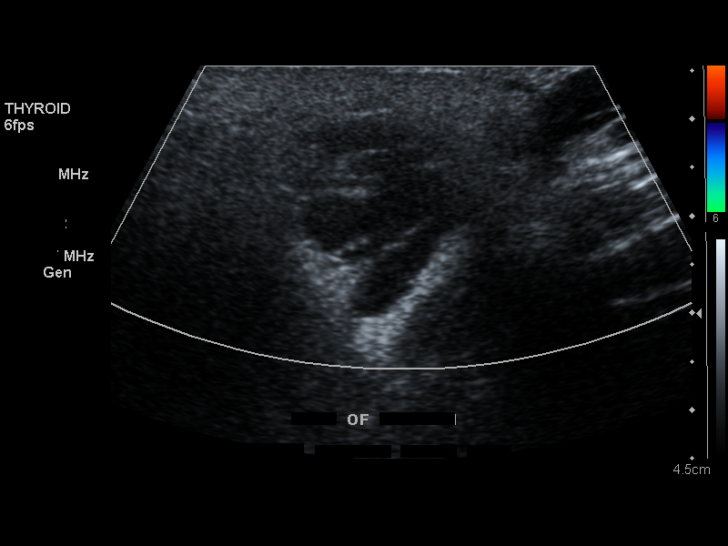
[im 10/26]
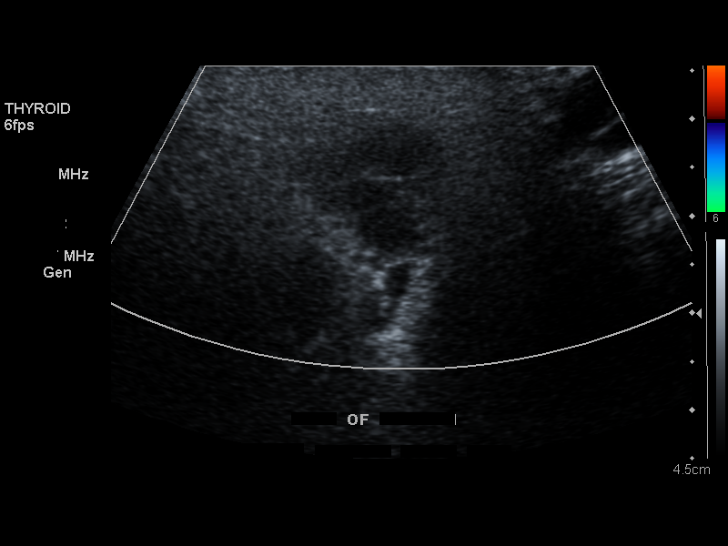
[im 12/26]
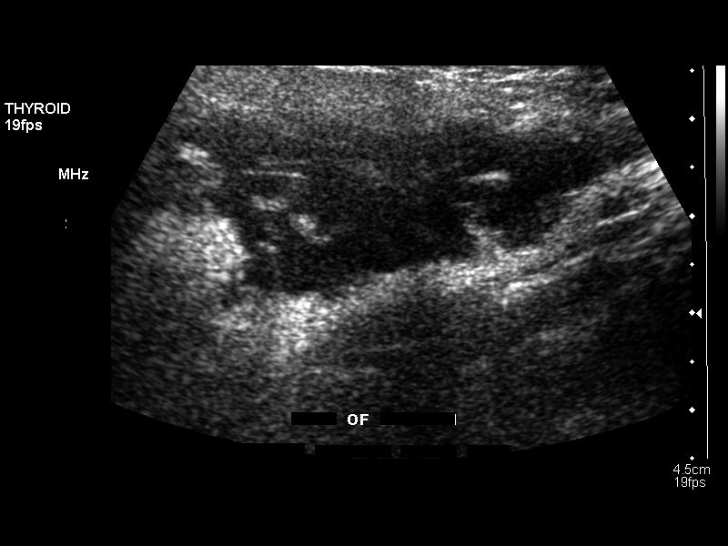
[im 14/26]
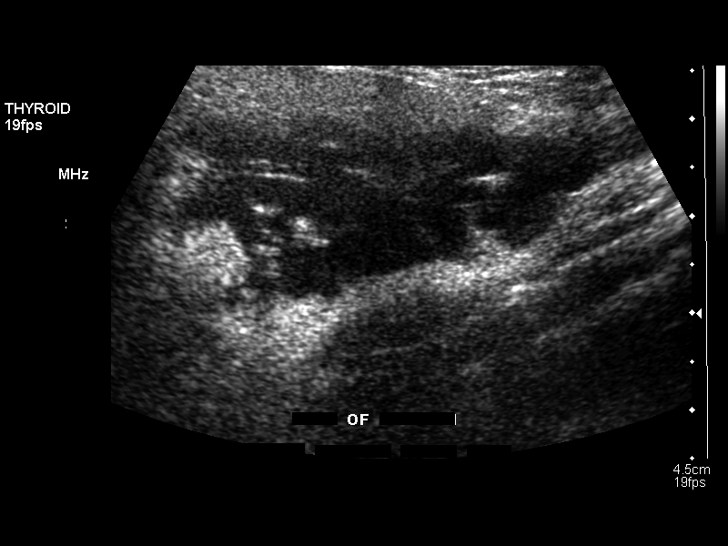
[im 16/26]
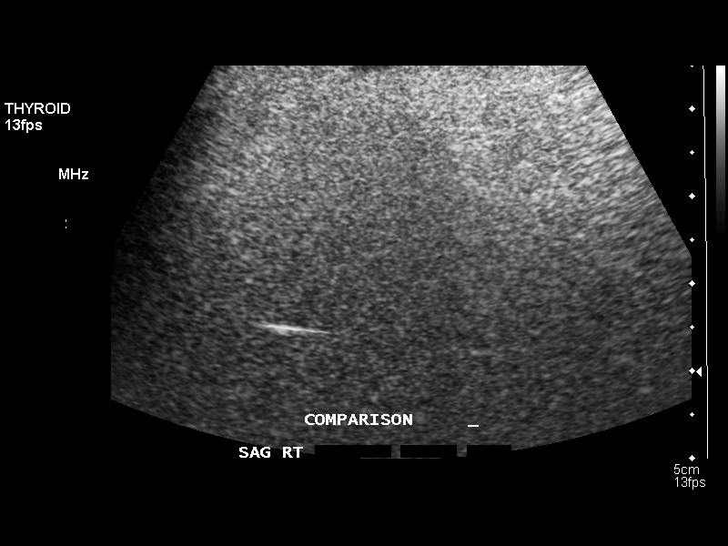
[im 17/26]
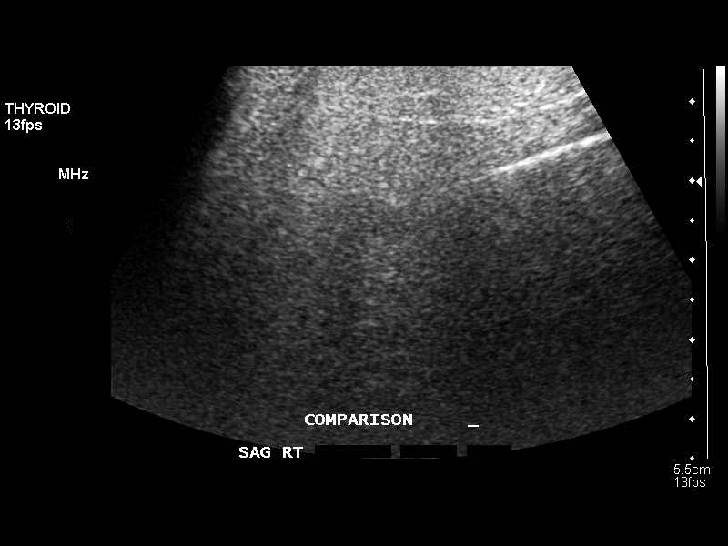
[im 19/26]
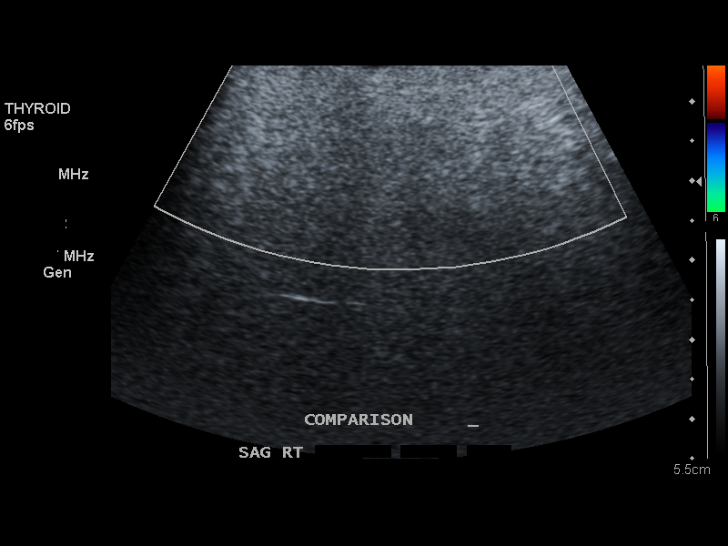
[im 21/26]
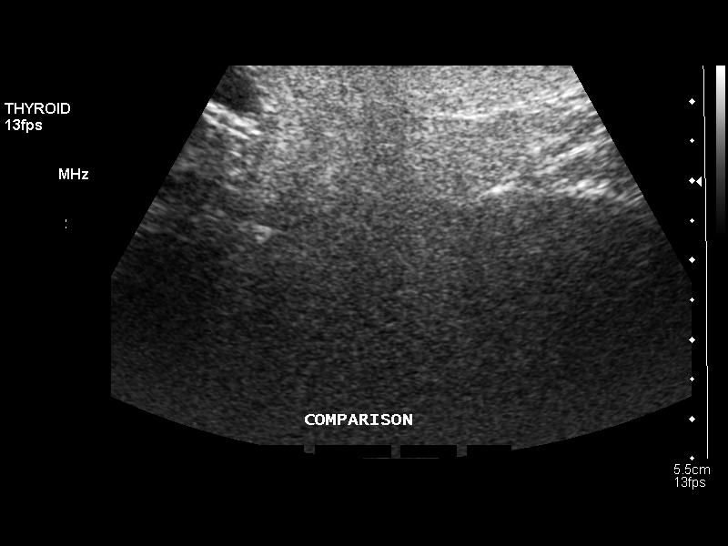
[im 23/26]
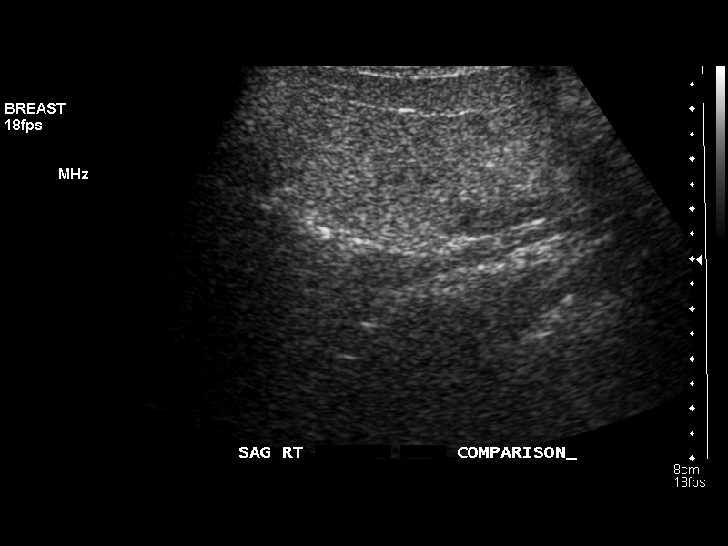
[im 26/26]
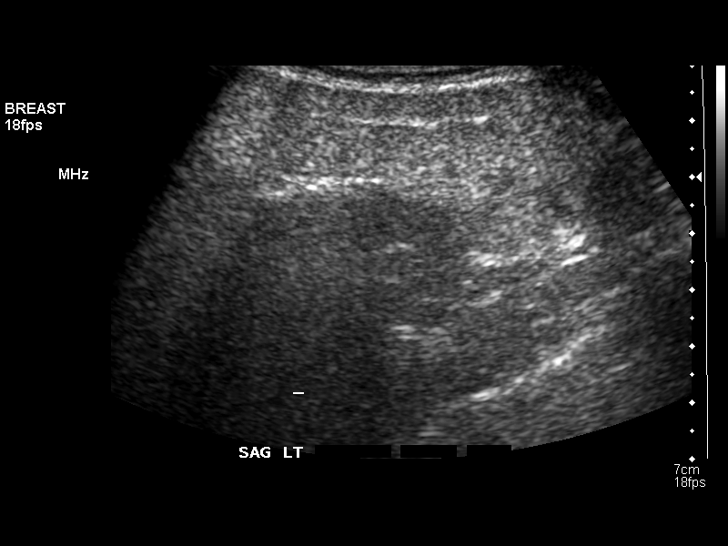

[14 of 25 positions shown; findings below may reference images not displayed]

FINDINGS: Ultrasound over the parotid glands was performed. At the site in
question there is a complex mass present involving the left parotid
gland with solid and cystic components measuring 4.8 x 2.4 x 2.2 cm.
This lesion does have some internal blood flow. In view of the
patient's symptoms this could be inflammatory in nature and
represent edema and possible abscess. However, I would expect more
prominent blood flow in the setting of an abscess. A neoplasm cannot
be excluded and MRI is recommended to assess further.

Ultrasound over the right parotid gland was performed. A tiny
hypoechoic structure is noted superficially of 4 mm in diameter
without blood flow of doubtful significance.
IMPRESSION: Complex hypoechoic structure within the left parotid gland may be
inflammatory in nature and could represent a developing abscess.
Tumor cannot be excluded. MRI is recommended to assess further.

## 2015-03-28 ENCOUNTER — Other Ambulatory Visit: Payer: Self-pay

## 2015-03-28 MED ORDER — PIOGLITAZONE HCL 30 MG PO TABS: 30.0000 mg | ORAL_TABLET | Freq: Every day | ORAL | Status: DC

## 2015-04-11 ENCOUNTER — Other Ambulatory Visit: Payer: Self-pay

## 2015-04-11 MED ORDER — SITAGLIPTIN PHOS-METFORMIN HCL 50-1000 MG PO TABS: 1.0000 | ORAL_TABLET | Freq: Two times a day (BID) | ORAL | Status: DC

## 2015-04-16 ENCOUNTER — Other Ambulatory Visit: Payer: Self-pay | Admitting: Internal Medicine

## 2015-05-02 ENCOUNTER — Other Ambulatory Visit: Payer: Self-pay | Admitting: Internal Medicine

## 2015-07-18 ENCOUNTER — Other Ambulatory Visit (INDEPENDENT_AMBULATORY_CARE_PROVIDER_SITE_OTHER): Payer: Managed Care, Other (non HMO)

## 2015-07-18 ENCOUNTER — Other Ambulatory Visit: Payer: Self-pay | Admitting: *Deleted

## 2015-07-18 DIAGNOSIS — E118 Type 2 diabetes mellitus with unspecified complications: Secondary | ICD-10-CM

## 2015-07-18 DIAGNOSIS — E1165 Type 2 diabetes mellitus with hyperglycemia: Secondary | ICD-10-CM

## 2015-07-18 DIAGNOSIS — IMO0002 Reserved for concepts with insufficient information to code with codable children: Secondary | ICD-10-CM

## 2015-07-18 LAB — COMPREHENSIVE METABOLIC PANEL
ALBUMIN: 4.1 g/dL (ref 3.5–5.2)
ALK PHOS: 62 U/L (ref 39–117)
ALT: 23 U/L (ref 0–53)
AST: 25 U/L (ref 0–37)
BILIRUBIN TOTAL: 0.5 mg/dL (ref 0.2–1.2)
BUN: 12 mg/dL (ref 6–23)
CO2: 24 mEq/L (ref 19–32)
CREATININE: 0.85 mg/dL (ref 0.40–1.50)
Calcium: 8.9 mg/dL (ref 8.4–10.5)
Chloride: 105 mEq/L (ref 96–112)
GFR: 103.82 mL/min (ref >60.00)
Glucose, Bld: 121 mg/dL — ABNORMAL HIGH (ref 70–99)
Potassium: 4.7 mEq/L (ref 3.5–5.1)
SODIUM: 138 meq/L (ref 135–145)
TOTAL PROTEIN: 7.5 g/dL (ref 6.0–8.3)

## 2015-07-18 LAB — LIPID PANEL
CHOLESTEROL: 235 mg/dL — AB (ref 0–200)
HDL: 45.9 mg/dL (ref >39.00)
LDL Cholesterol: 164 mg/dL — ABNORMAL HIGH (ref 0–99)
NonHDL: 188.97
TRIGLYCERIDES: 127 mg/dL (ref 0.0–149.0)
Total CHOL/HDL Ratio: 5
VLDL: 25.4 mg/dL (ref 0.0–40.0)

## 2015-07-18 LAB — MICROALBUMIN / CREATININE URINE RATIO
Creatinine,U: 154.6 mg/dL
MICROALB UR: 2.5 mg/dL — AB (ref 0.0–1.9)
MICROALB/CREAT RATIO: 1.6 mg/g (ref 0.0–30.0)

## 2015-07-18 LAB — HEMOGLOBIN A1C: Hgb A1c MFr Bld: 6.1 % (ref 4.6–6.5)

## 2015-07-24 ENCOUNTER — Encounter: Payer: Self-pay | Admitting: Internal Medicine

## 2015-07-24 ENCOUNTER — Ambulatory Visit (INDEPENDENT_AMBULATORY_CARE_PROVIDER_SITE_OTHER): Payer: Managed Care, Other (non HMO) | Admitting: Internal Medicine

## 2015-07-24 VITALS — BP 116/72 | HR 72 | Temp 97.8°F | Ht 65.0 in | Wt 184.0 lb

## 2015-07-24 DIAGNOSIS — E785 Hyperlipidemia, unspecified: Secondary | ICD-10-CM

## 2015-07-24 DIAGNOSIS — Z0000 Encounter for general adult medical examination without abnormal findings: Secondary | ICD-10-CM

## 2015-07-24 DIAGNOSIS — Z23 Encounter for immunization: Secondary | ICD-10-CM | POA: Diagnosis not present

## 2015-07-24 DIAGNOSIS — Z114 Encounter for screening for human immunodeficiency virus [HIV]: Secondary | ICD-10-CM

## 2015-07-24 DIAGNOSIS — Z09 Encounter for follow-up examination after completed treatment for conditions other than malignant neoplasm: Secondary | ICD-10-CM

## 2015-07-24 MED ORDER — ROSUVASTATIN CALCIUM 10 MG PO TABS: 10.0000 mg | ORAL_TABLET | Freq: Every day | ORAL | Status: DC

## 2015-07-24 NOTE — Assessment & Plan Note (Signed)
DM: per endocrinology; feet exam negative today, reports a recent eye check normal. Hyperlipidemia: LDL elevated, 164. Not taking Crestor 5 mg consistently. Plan: Increase Crestor to 10 mg, take it consistently. Recheck labs in 3 months. Addendum: elected to stay on crestor 5 mg, rec to take 1/2 tab a day RTC 6-8 months

## 2015-07-24 NOTE — Assessment & Plan Note (Signed)
Tdap 2008 pnm shot today Had a flu shot  Never had a cscope  Diet and exercise discussed Labs reviewed : Come back in 6 weeks to recheck his FLP. Also check a CBC and HIV.

## 2015-07-24 NOTE — Progress Notes (Signed)
Subjective:    Patient ID: Shawn Walter, male    DOB: 1971/04/26, 45 y.o.   MRN: [id number]  DOS:  07/24/2015 Type of visit - description : CPX Interval history: doing well     Review of Systems  Constitutional: No fever. No chills. No unexplained wt changes. No unusual sweats  HEENT: No dental problems, no ear discharge, no facial swelling, no voice changes. No eye discharge, no eye  redness , no  intolerance to light   Respiratory: No wheezing , no  difficulty breathing. No cough , no mucus production  Cardiovascular: No CP, no leg swelling , no  Palpitations  GI: no nausea, no vomiting, no diarrhea , no  abdominal pain.  No blood in the stools. No dysphagia, no odynophagia    Endocrine: No polyphagia, no polyuria , no polydipsia  GU: No dysuria, gross hematuria, difficulty urinating. No urinary urgency, no frequency.  Musculoskeletal: No joint swellings or unusual aches or pains  Skin: No change in the color of the skin, palor , no  Rash  Allergic, immunologic: No environmental allergies , no  food allergies  Neurological: No dizziness no  syncope. No headaches. No diplopia, no slurred, no slurred speech, no motor deficits, no facial  Numbness  Hematological: No enlarged lymph nodes, no easy bruising , no unusual bleedings  Psychiatry: No suicidal ideas, no hallucinations, no beavior problems, no confusion.  No unusual/severe anxiety, no depression   Past Medical History  Diagnosis Date  . Diabetes mellitus   . Hyperlipidemia   . Normal cardiac stress test 02-2011   . Urolithiasis 11-2013    L  . Renal cyst, right     Complex, Dr. Marlou Porch    Past Surgical History  Procedure Laterality Date  . Incise and drain abcess  07/11/11    perirectal abscess     Social History   Social History  . Marital Status: Married    Spouse Name: N/A  . Number of Children: 2  . Years of Education: N/A   Occupational History  . works at [employer], Arts administrator    Social  History Main Topics  . Smoking status: Former Smoker    Quit date: 05/13/1999  . Smokeless tobacco: Never Used  . Alcohol Use: Yes     Comment: socially   . Drug Use: No  . Sexual Activity: Not on file   Other Topics Concern  . Not on file   Social History Narrative   Married, born in [place]     children x 2  (2004, 2010)      Family History  Problem Relation Age of Onset  . Breast cancer Mother   . Hypertension Neg Hx   . Hyperlipidemia Neg Hx   . Heart attack Neg Hx   . Diabetes Neg Hx   . Colon cancer Neg Hx   . Prostate cancer Neg Hx        Medication List       This list is accurate as of: 07/24/15  3:22 PM.  Always use your most recent med list.               aspirin 81 MG tablet  Take 81 mg by mouth daily.     glucose blood test strip  Check blood sugar no more than twice daily.     onetouch ultrasoft lancets  Use as instructed     pioglitazone 30 MG tablet  Commonly known as:  ACTOS  Take 1  tablet (30 mg total) by mouth daily.     rosuvastatin 10 MG tablet  Commonly known as:  CRESTOR  Take 1 tablet (10 mg total) by mouth daily.     sitaGLIPtin-metformin 50-1000 MG tablet  Commonly known as:  JANUMET  Take 1 tablet by mouth 2 (two) times daily.           Objective:   Physical Exam BP 116/72 mmHg  Pulse 72  Temp(Src) 97.8 F (36.6 C) (Oral)  Ht 5\' 5"  (1.651 m)  Wt 184 lb (83.462 kg)  BMI 30.62 kg/m2  SpO2 97%  General:   Well developed, well nourished . NAD.  Neck: No  thyromegaly , normal carotid pulse HEENT:  Normocephalic . Face symmetric, atraumatic Lungs:  CTA B Normal respiratory effort, no intercostal retractions, no accessory muscle use. Heart: RRR,  no murmur.  No pretibial edema bilaterally  Abdomen:  Not distended, soft, non-tender. No rebound or rigidity.   DIABETIC FEET EXAM: No lower extremity edema Normal pedal pulses bilaterally Skin normal, nails normal, no calluses Pinprick examination of the feet  normal. Neurologic:  alert & oriented X3.  Speech normal, gait appropriate for age and unassisted Strength symmetric and appropriate for age.  Psych: Cognition and judgment appear intact.  Cooperative with normal attention span and concentration.  Behavior appropriate. No anxious or depressed appearing.    Assessment & Plan:   Assessment DM Hyperlipidemia Urolithiasis  --L--  11-2013 Right renal cyst, complex, Dr. Marlou PorchHerrick Pyelonephritis 09-2014  PLAN  DM: per endocrinology; feet exam negative today, reports a recent eye check normal. Hyperlipidemia: LDL elevated, 164. Not taking Crestor 5 mg consistently. Plan: Increase Crestor to 10 mg, take it consistently. Recheck labs in 3 months. Addendum: elected to stay on crestor 5 mg, rec to take 1/2 tab a day RTC 6-8 months

## 2015-07-24 NOTE — Progress Notes (Signed)
Pre visit review using our clinic review tool, if applicable. No additional management support is needed unless otherwise documented below in the visit note. 

## 2015-07-24 NOTE — Patient Instructions (Signed)
  GO TO THE FRONT DESK  Schedule labs to be done in 6 weeks, fasting. FLP, AST, ALT, CBC, HIV.  Schedule  your next appointment for a   Routine check up When?  6 to 8 months from today Fasting?  Yes

## 2015-07-31 ENCOUNTER — Ambulatory Visit (INDEPENDENT_AMBULATORY_CARE_PROVIDER_SITE_OTHER): Payer: Managed Care, Other (non HMO) | Admitting: Endocrinology

## 2015-07-31 ENCOUNTER — Encounter: Payer: Self-pay | Admitting: Endocrinology

## 2015-07-31 VITALS — BP 122/72 | HR 73 | Temp 98.5°F | Resp 14 | Ht 65.0 in | Wt 185.4 lb

## 2015-07-31 DIAGNOSIS — E1165 Type 2 diabetes mellitus with hyperglycemia: Secondary | ICD-10-CM | POA: Diagnosis not present

## 2015-07-31 DIAGNOSIS — E785 Hyperlipidemia, unspecified: Secondary | ICD-10-CM | POA: Diagnosis not present

## 2015-07-31 NOTE — Progress Notes (Signed)
Shawn Walter           Reason for Appointment : Followup for Type 2 Diabetes  History of Present Illness          Diagnosis: Type 2 diabetes mellitus, date of diagnosis: 2004        Past history: His glucose was about 220 at diagnosis. He thinks he was not treated with medications but told to modify his diet and start exercise. Not clear how his diabetes was controlled with this regimen He thinks about 3 years ago he was started on metformin, probably when his A1c was 8.2%. Blood sugar was somewhat better but his A1c at best was 7.1% Although he had some initial improvement with metformin initially his blood sugars have been generally higher this year especially in 1/14 At some point he was also given Amaryl 4 mg but this was stopped when he was having low blood sugars Prior to his initial consultation he had been totally noncompliant with diet, exercise and glucose monitoring. He  probably gained weight also. He was on Janumet and Actos but his glucose on the lab was 348 along with A1c of 9.5  Recent history:   He is now being referred back for further management of his diabetes, previously seen in 2014 He is currently on Janumet maximum dose and Actos 30 mg He has been somewhat irregular with follow-up here and also with his PCP  Current problems, management and glycemic control:  He appears to have had significant amount of weight gain since his last visit  Over the last few years his weight has ranged from 158-175 but now it is significantly higher and he thinks this is from poor diet and lack of exercise  He is asking about taking metformin only instead of current regimen  He is checking his blood sugars irregularly and did not bring his monitor for download; he says that if he goes off his diet his blood sugar will be up to 220 but fasting is usually 110-120  He tends to have relatively high carbohydrate diet along with fried foods and not controlling portions.  Will be  eating out during the day, more recently has started to cut back on carbohydrates like rice and also reducing portions  He has started back on his walking program in the last 2 months or so and he thinks his weight is coming down  Surprisingly with his weight gain his A1c has not gone up    Glucose monitoring:  less than once a day       Glucometer: Accu-Chek nano    Blood Glucose readings: As above           Self-care:   Meals: 3 meals per day.  now eating very little rice and starting to cut back on portions overall but not always watching fats     Physical activity: exercise: On treadmill 30 minutes, 5 days a week, 2 months         Dietician visit: Most recent: At the time of diagnosis          He has annual eye exams, reports not available         Wt Readings from Last 3 Encounters:  07/31/15 185 lb 6.4 oz (84.097 kg)  07/24/15 184 lb (83.462 kg)  09/18/14 171 lb 3 oz (77.65 kg)    Lab Results  Component Value Date   HGBA1C 6.1 07/18/2015   HGBA1C 6.0 08/28/2014   HGBA1C 6.2 04/20/2013  Lab Results  Component Value Date   MICROALBUR 2.5* 07/18/2015   LDLCALC 164* 07/18/2015   CREATININE 0.85 07/18/2015       Medication List       This list is accurate as of: 07/31/15  4:45 PM.  Always use your most recent med list.               aspirin 81 MG tablet  Take 81 mg by mouth daily.     glucose blood test strip  Check blood sugar no more than twice daily.     onetouch ultrasoft lancets  Use as instructed     pioglitazone 30 MG tablet  Commonly known as:  ACTOS  Take 1 tablet (30 mg total) by mouth daily.     rosuvastatin 10 MG tablet  Commonly known as:  CRESTOR  Take 1 tablet (10 mg total) by mouth daily.     sitaGLIPtin-metformin 50-1000 MG tablet  Commonly known as:  JANUMET  Take 1 tablet by mouth 2 (two) times daily.          Allergies: No Known Allergies  Past Medical History  Diagnosis Date  . Diabetes mellitus   . Hyperlipidemia     . Normal cardiac stress test 02-2011   . Urolithiasis 11-2013    L  . Renal cyst, right     Complex, Dr. Marlou Porch    Past Surgical History  Procedure Laterality Date  . Incise and drain abcess  07/11/11    perirectal abscess     Family History  Problem Relation Age of Onset  . Breast cancer Mother   . Hypertension Neg Hx   . Hyperlipidemia Neg Hx   . Heart attack Neg Hx   . Diabetes Neg Hx   . Colon cancer Neg Hx   . Prostate cancer Neg Hx   . Sudden death Brother     Social History:  reports that he quit smoking about 16 years ago. He has never used smokeless tobacco. He reports that he drinks alcohol. He reports that he does not use illicit drugs.    Review of Systems       Lipids: He has been treated with Lipitor for a few yearsPreviously. He now thinks that it was causing myalgia  He was told to start Crestor but he ran out 2 months ago Still thinks he can control his lipids with exercise and diet  Currently is getting more saturated fat especially with fried food, coconut based foods and eating 10 eggs a week  There is a history of sudden death in his family with his brother dying at the age of 24  LDL is significantly high  Crestor  Lab Results  Component Value Date   CHOL 235* 07/18/2015   CHOL 160 10/28/2013   CHOL 234* 04/20/2013   Lab Results  Component Value Date   HDL 45.90 07/18/2015   HDL 47.00 10/28/2013   HDL 50.10 04/20/2013   Lab Results  Component Value Date   LDLCALC 164* 07/18/2015   LDLCALC 95 10/28/2013   LDLCALC 108* 09/02/2012   Lab Results  Component Value Date   TRIG 127.0 07/18/2015   TRIG 89.0 10/28/2013   TRIG 108.0 04/20/2013   Lab Results  Component Value Date   CHOLHDL 5 07/18/2015   CHOLHDL 3 10/28/2013   CHOLHDL 5 04/20/2013   Lab Results  Component Value Date   LDLDIRECT 178.1 04/20/2013   LDLDIRECT 173.6 05/14/2012   LDLDIRECT 119.7 02/27/2011  LABS:  No visits with results within 1 Week(s) from  this visit. Latest known visit with results is:  Lab on 07/18/2015  Component Date Value Ref Range Status  . Cholesterol 07/18/2015 235* 0 - 200 mg/dL Final   ATP III Classification       Desirable:  < 200 mg/dL               Borderline High:  200 - 239 mg/dL          High:  > = 161240 mg/dL  . Triglycerides 07/18/2015 127.0  0.0 - 149.0 mg/dL Final   Normal:  <096<150 mg/dLBorderline High:  150 - 199 mg/dL  . HDL 07/18/2015 45.90  >39.00 mg/dL Final  . VLDL 04/54/098103/12/2015 25.4  0.0 - 40.0 mg/dL Final  . LDL Cholesterol 07/18/2015 164* 0 - 99 mg/dL Final  . Total CHOL/HDL Ratio 07/18/2015 5   Final                  Men          Women1/2 Average Risk     3.4          3.3Average Risk          5.0          4.42X Average Risk          9.6          7.13X Average Risk          15.0          11.0                      . NonHDL 07/18/2015 188.97   Final   NOTE:  Non-HDL goal should be 30 mg/dL higher than patient's LDL goal (i.e. LDL goal of < 70 mg/dL, would have non-HDL goal of < 100 mg/dL)  . Hgb A1c MFr Bld 07/18/2015 6.1  4.6 - 6.5 % Final   Glycemic Control Guidelines for People with Diabetes:Non Diabetic:  <6%Goal of Therapy: <7%Additional Action Suggested:  >8%   . Sodium 07/18/2015 138  135 - 145 mEq/L Final  . Potassium 07/18/2015 4.7  3.5 - 5.1 mEq/L Final  . Chloride 07/18/2015 105  96 - 112 mEq/L Final  . CO2 07/18/2015 24  19 - 32 mEq/L Final  . Glucose, Bld 07/18/2015 121* 70 - 99 mg/dL Final  . BUN 19/14/782903/12/2015 12  6 - 23 mg/dL Final  . Creatinine, Ser 07/18/2015 0.85  0.40 - 1.50 mg/dL Final  . Total Bilirubin 07/18/2015 0.5  0.2 - 1.2 mg/dL Final  . Alkaline Phosphatase 07/18/2015 62  39 - 117 U/L Final  . AST 07/18/2015 25  0 - 37 U/L Final  . ALT 07/18/2015 23  0 - 53 U/L Final  . Total Protein 07/18/2015 7.5  6.0 - 8.3 g/dL Final  . Albumin 56/21/308603/12/2015 4.1  3.5 - 5.2 g/dL Final  . Calcium 57/84/696203/12/2015 8.9  8.4 - 10.5 mg/dL Final  . GFR 95/28/413203/12/2015 103.82  >60.00 mL/min Final  . Microalb,  Ur 07/18/2015 2.5* 0.0 - 1.9 mg/dL Final  . Creatinine,U 44/01/027203/12/2015 154.6   Final  . Microalb Creat Ratio 07/18/2015 1.6  0.0 - 30.0 mg/g Final    Physical Examination:  BP 122/72 mmHg  Pulse 73  Temp(Src) 98.5 F (36.9 C)  Resp 14  Ht 5\' 5"  (1.651 m)  Wt 185 lb 6.4 oz (84.097 kg)  BMI 30.85 kg/m2  SpO2 97%  ASSESSMENT:  Diabetes type 2: See history of present illness for detailed discussion of current diabetes management, blood sugar patterns and problems identified He is returning now for follow-up after over 2 years He has maintained his level of control with Janumet and Actos Most likely is having stabilization of his diabetes using Januvia and Actos  However his main difficulties are related to poor compliance with diet and exercise regimen and continued weight gain He has lack of motivation and probably not checking blood sugar much especially after meals  Discussed importance of using insulin sensitizers and medication like Januvia to help long-term controlled especially postprandial hyperglycemia.  Discouraged him from trying metformin alone as treatment For now we can hold off on his Actos and see if his control continues to be good He will continue to improve diet and exercise He needs to reduce fat intake, carbohydrates and have balanced meals with some routine consistently  He refuses to consider consultation with dietitian  HYPERLIPIDEMIA: Discuss  sources of saturated fat and given him a handout on this He will take Crestor as directed by PCP and discussed importance of statin drugs with diabetes and his ethnic status which is putting him at high risk along with family history of sudden death of unknown etiology    Counseling time on subjects discussed above is over 50% of today's 25 minute visit   Carmella Kees 07/31/2015, 4:45 PM

## 2015-07-31 NOTE — Patient Instructions (Addendum)
Check blood sugars on waking up 2-3  times a week Also check blood sugars about 2 hours after a meal and do this after different meals by rotation  Recommended blood sugar levels on waking up is 90-130 and about 2 hours after meal is 130-160  Please bring your blood sugar monitor to each visit, thank you  Low sat fat diet  Stop actos

## 2015-08-16 ENCOUNTER — Other Ambulatory Visit: Payer: Self-pay | Admitting: Urology

## 2015-08-16 DIAGNOSIS — N281 Cyst of kidney, acquired: Secondary | ICD-10-CM

## 2015-08-27 ENCOUNTER — Telehealth: Payer: Self-pay | Admitting: Endocrinology

## 2015-08-27 ENCOUNTER — Other Ambulatory Visit: Payer: Self-pay | Admitting: Endocrinology

## 2015-08-27 MED ORDER — SITAGLIPTIN PHOS-METFORMIN HCL 50-1000 MG PO TABS: 1.0000 | ORAL_TABLET | Freq: Two times a day (BID) | ORAL | Status: DC

## 2015-08-27 NOTE — Telephone Encounter (Signed)
rx sent

## 2015-08-27 NOTE — Telephone Encounter (Signed)
PT requests Janumet be a 90 days supply be sent into [facility] on [address]

## 2015-09-03 ENCOUNTER — Other Ambulatory Visit: Payer: Self-pay | Admitting: *Deleted

## 2015-09-03 ENCOUNTER — Telehealth: Payer: Self-pay | Admitting: Internal Medicine

## 2015-09-03 ENCOUNTER — Telehealth: Payer: Self-pay | Admitting: Endocrinology

## 2015-09-03 MED ORDER — SITAGLIPTIN PHOS-METFORMIN HCL 50-1000 MG PO TABS: 1.0000 | ORAL_TABLET | Freq: Two times a day (BID) | ORAL | Status: DC

## 2015-09-03 NOTE — Telephone Encounter (Signed)
rx sent for 90 day supply

## 2015-09-03 NOTE — Telephone Encounter (Signed)
Left message on Voicemail 4/24 @ 6:30 PM for patient to call and reschedule Lab appt scheduled for 4/25

## 2015-09-03 NOTE — Telephone Encounter (Signed)
PT said he needs his Janumet a 90 days supply and that his insurance went up on the 30 day supply price

## 2015-09-04 ENCOUNTER — Other Ambulatory Visit (INDEPENDENT_AMBULATORY_CARE_PROVIDER_SITE_OTHER): Payer: Managed Care, Other (non HMO)

## 2015-09-04 ENCOUNTER — Other Ambulatory Visit: Payer: Managed Care, Other (non HMO)

## 2015-09-04 DIAGNOSIS — Z114 Encounter for screening for human immunodeficiency virus [HIV]: Secondary | ICD-10-CM

## 2015-09-04 DIAGNOSIS — E785 Hyperlipidemia, unspecified: Secondary | ICD-10-CM

## 2015-09-04 LAB — CBC WITH DIFFERENTIAL/PLATELET
BASOS PCT: 1 % (ref 0.0–3.0)
Basophils Absolute: 0.1 10*3/uL (ref 0.0–0.1)
Eosinophils Absolute: 0.2 10*3/uL (ref 0.0–0.7)
Eosinophils Relative: 2.5 % (ref 0.0–5.0)
HEMATOCRIT: 41.5 % (ref 39.0–52.0)
HEMOGLOBIN: 14.3 g/dL (ref 13.0–17.0)
LYMPHS PCT: 26.9 % (ref 12.0–46.0)
Lymphs Abs: 2 10*3/uL (ref 0.7–4.0)
MCHC: 34.4 g/dL (ref 30.0–36.0)
MCV: 88 fl (ref 78.0–100.0)
Monocytes Absolute: 0.6 10*3/uL (ref 0.1–1.0)
Monocytes Relative: 8 % (ref 3.0–12.0)
Neutro Abs: 4.5 10*3/uL (ref 1.4–7.7)
Neutrophils Relative %: 61.6 % (ref 43.0–77.0)
Platelets: 297 10*3/uL (ref 150.0–400.0)
RBC: 4.71 Mil/uL (ref 4.22–5.81)
RDW: 12.5 % (ref 11.5–15.5)
WBC: 7.4 10*3/uL (ref 4.0–10.5)

## 2015-09-04 LAB — LIPID PANEL
Cholesterol: 190 mg/dL (ref 0–200)
HDL: 44.2 mg/dL (ref >39.00)
LDL Cholesterol: 117 mg/dL — ABNORMAL HIGH (ref 0–99)
NONHDL: 146
TRIGLYCERIDES: 146 mg/dL (ref 0.0–149.0)
Total CHOL/HDL Ratio: 4
VLDL: 29.2 mg/dL (ref 0.0–40.0)

## 2015-09-04 LAB — ALT: ALT: 40 U/L (ref 0–53)

## 2015-09-04 LAB — AST: AST: 30 U/L (ref 0–37)

## 2015-09-05 LAB — HIV ANTIBODY (ROUTINE TESTING W REFLEX): HIV 1&2 Ab, 4th Generation: NONREACTIVE

## 2015-09-07 ENCOUNTER — Other Ambulatory Visit: Payer: Self-pay

## 2015-09-07 MED ORDER — ROSUVASTATIN CALCIUM 20 MG PO TABS: 20.0000 mg | ORAL_TABLET | Freq: Every day | ORAL | Status: DC

## 2015-09-07 NOTE — Progress Notes (Signed)
Crestor 20 mg sent to pharmacy. 

## 2015-09-12 NOTE — Progress Notes (Signed)
Copy of lab results mailed to patient.

## 2015-09-24 ENCOUNTER — Ambulatory Visit (HOSPITAL_COMMUNITY): Admission: RE | Admit: 2015-09-24 | Payer: Managed Care, Other (non HMO) | Source: Ambulatory Visit

## 2015-10-18 ENCOUNTER — Ambulatory Visit (HOSPITAL_COMMUNITY)
Admission: RE | Admit: 2015-10-18 | Discharge: 2015-10-18 | Disposition: A | Discharge status: Home / Self Care | Payer: Managed Care, Other (non HMO) | Source: Ambulatory Visit | Attending: Urology | Admitting: Urology

## 2015-10-18 DIAGNOSIS — N281 Cyst of kidney, acquired: Secondary | ICD-10-CM | POA: Diagnosis present

## 2015-10-18 DIAGNOSIS — K760 Fatty (change of) liver, not elsewhere classified: Secondary | ICD-10-CM | POA: Diagnosis not present

## 2015-10-18 LAB — POCT I-STAT CREATININE: CREATININE: 0.7 mg/dL (ref 0.61–1.24)

## 2015-10-18 MED ORDER — GADOBENATE DIMEGLUMINE 529 MG/ML IV SOLN
20.0000 mL | Freq: Once | INTRAVENOUS | Status: AC | PRN
  Administered 2015-10-18: 16 mL via INTRAVENOUS

## 2015-10-29 ENCOUNTER — Other Ambulatory Visit: Payer: Managed Care, Other (non HMO)

## 2015-11-01 ENCOUNTER — Ambulatory Visit: Payer: Managed Care, Other (non HMO) | Admitting: Endocrinology

## 2015-11-29 ENCOUNTER — Other Ambulatory Visit (INDEPENDENT_AMBULATORY_CARE_PROVIDER_SITE_OTHER): Payer: Managed Care, Other (non HMO)

## 2015-11-29 DIAGNOSIS — E1165 Type 2 diabetes mellitus with hyperglycemia: Secondary | ICD-10-CM

## 2015-11-29 LAB — BASIC METABOLIC PANEL
BUN: 14 mg/dL (ref 6–23)
CALCIUM: 9.5 mg/dL (ref 8.4–10.5)
CHLORIDE: 101 meq/L (ref 96–112)
CO2: 27 meq/L (ref 19–32)
Creatinine, Ser: 0.92 mg/dL (ref 0.40–1.50)
GFR: 94.6 mL/min (ref >60.00)
Glucose, Bld: 272 mg/dL — ABNORMAL HIGH (ref 70–99)
Potassium: 5 mEq/L (ref 3.5–5.1)
SODIUM: 137 meq/L (ref 135–145)

## 2015-11-29 LAB — HEMOGLOBIN A1C: HEMOGLOBIN A1C: 10 % — AB (ref 4.6–6.5)

## 2015-12-04 ENCOUNTER — Encounter: Payer: Self-pay | Admitting: Endocrinology

## 2015-12-04 ENCOUNTER — Ambulatory Visit (INDEPENDENT_AMBULATORY_CARE_PROVIDER_SITE_OTHER): Payer: Managed Care, Other (non HMO) | Admitting: Endocrinology

## 2015-12-04 VITALS — BP 120/88 | HR 71 | Wt 174.0 lb

## 2015-12-04 DIAGNOSIS — E1165 Type 2 diabetes mellitus with hyperglycemia: Secondary | ICD-10-CM | POA: Diagnosis not present

## 2015-12-04 MED ORDER — PIOGLITAZONE HCL 15 MG PO TABS: 15.0000 mg | ORAL_TABLET | Freq: Every day | ORAL | 1 refills | Status: DC

## 2015-12-04 NOTE — Patient Instructions (Addendum)
Check blood sugars on waking up 2-3  times a week  Also check blood sugars about 2 hours after a meal and do this after different meals by rotation  Recommended blood sugar levels on waking up is 90-130 and about 2 hours after meal is 130-160  Please bring your blood sugar monitor to each visit, thank you  Walk daily  Actos 15mg  daily

## 2015-12-04 NOTE — Progress Notes (Signed)
Shawn Walter           Reason for Appointment : Followup for Type 2 Diabetes  History of Present Illness          Diagnosis: Type 2 diabetes mellitus, date of diagnosis: 2004        Past history: His glucose was about 220 at diagnosis. He thinks he was not treated with medications but told to modify his diet and start exercise. Not clear how his diabetes was controlled with this regimen He thinks about 3 years ago he was started on metformin, probably when his A1c was 8.2%. Blood sugar was somewhat better but his A1c at best was 7.1% Although he had some initial improvement with metformin initially his blood sugars have been generally higher this year especially in 1/14 At some point he was also given Amaryl 4 mg but this was stopped when he was having low blood sugars Prior to his initial consultation he had been totally noncompliant with diet, exercise and glucose monitoring. He  probably gained weight also. He was on Janumet and Actos but his glucose on the lab was 348 along with A1c of 9.5  Recent history:   He is currently on Janumet maximum dose monotherapy Has not been seen for the last 4 months  His A1c was markedly higher 10%, previously 6.1  Current problems, management and glycemic control:  He appears to have had significant hyperglycemia recently with A1c increasing markedly  Although he does not complain of fatigue he has had some more thirst and nocturia one time  He has checked his blood sugar very sporadically and probably none recently as he thinks his last blood sugar was about 130  He thinks he is poorly compliant with his diet and eating out more as well as not watching portions of carbohydrates  Has not exercised as before  Lab glucose was 272    Glucose monitoring:  less than once a day       Glucometer: Accu-Chek nano    Blood Glucose readings:           Self-care:   Meals: 3 meals per day.  now eating very little rice and starting to cut back on  portions overall but not always watching fats     Physical activity: exercise: was On treadmill 30 minutes, 5 days a week, 2 months         Dietician visit: Most recent: At the time of diagnosis          He has annual eye exams, reports not available         Wt Readings from Last 3 Encounters:  12/04/15 174 lb (78.9 kg)  07/31/15 185 lb 6.4 oz (84.1 kg)  07/24/15 184 lb (83.5 kg)    Lab Results  Component Value Date   HGBA1C 10.0 (H) 11/29/2015   HGBA1C 6.1 07/18/2015   HGBA1C 6.0 08/28/2014   Lab Results  Component Value Date   MICROALBUR 2.5 (H) 07/18/2015   LDLCALC 117 (H) 09/04/2015   CREATININE 0.92 11/29/2015   Lab on 11/29/2015  Component Date Value Ref Range Status  . Hgb A1c MFr Bld 11/29/2015 10.0* 4.6 - 6.5 % Final  . Sodium 11/29/2015 137  135 - 145 mEq/L Final  . Potassium 11/29/2015 5.0  3.5 - 5.1 mEq/L Final  . Chloride 11/29/2015 101  96 - 112 mEq/L Final  . CO2 11/29/2015 27  19 - 32 mEq/L Final  . Glucose, Bld 11/29/2015 272*  70 - 99 mg/dL Final  . BUN 52/84/1324 14  6 - 23 mg/dL Final  . Creatinine, Ser 11/29/2015 0.92  0.40 - 1.50 mg/dL Final  . Calcium 40/02/2724 9.5  8.4 - 10.5 mg/dL Final  . GFR 36/64/4034 94.60  >60.00 mL/min Final       Medication List       Accurate as of 12/04/15  8:54 AM. Always use your most recent med list.          aspirin 81 MG tablet Take 81 mg by mouth daily.   glucose blood test strip Check blood sugar no more than twice daily.   onetouch ultrasoft lancets Use as instructed   pioglitazone 30 MG tablet Commonly known as:  ACTOS Take 1 tablet (30 mg total) by mouth daily.   rosuvastatin 10 MG tablet Commonly known as:  CRESTOR Take 1 tablet (10 mg total) by mouth daily.   rosuvastatin 20 MG tablet Commonly known as:  CRESTOR Take 1 tablet (20 mg total) by mouth daily.   sitaGLIPtin-metformin 50-1000 MG tablet Commonly known as:  JANUMET Take 1 tablet by mouth 2 (two) times daily.          Allergies: No Known Allergies  Past Medical History:  Diagnosis Date  . Diabetes mellitus   . Hyperlipidemia   . Normal cardiac stress test 02-2011   . Renal cyst, right    Complex, Dr. Marlou Porch  . Urolithiasis 11-2013   L    Past Surgical History:  Procedure Laterality Date  . INCISE AND DRAIN ABCESS  07/11/11   perirectal abscess     Family History  Problem Relation Age of Onset  . Breast cancer Mother   . Hypertension Neg Hx   . Hyperlipidemia Neg Hx   . Heart attack Neg Hx   . Diabetes Neg Hx   . Colon cancer Neg Hx   . Prostate cancer Neg Hx   . Sudden death Brother     Social History:  reports that he quit smoking about 16 years ago. He has never used smokeless tobacco. He reports that he drinks alcohol. He reports that he does not use drugs.    Review of Systems       Lipids: He has been treated with Lipitor for a few yearsPreviously. He now thinks that it was causing myalgia  He thinks he is taking his Crestor now has been prescribed by PCP but no follow-up level available  He has been advised to reduce saturated fat especially with fried food, coconut based foods   There is a history of sudden death in his family with his brother dying at the age of 73   LDL is significantly high  Crestor  Lab Results  Component Value Date   CHOL 190 09/04/2015   CHOL 235 (H) 07/18/2015   CHOL 160 10/28/2013   Lab Results  Component Value Date   HDL 44.20 09/04/2015   HDL 45.90 07/18/2015   HDL 47.00 10/28/2013   Lab Results  Component Value Date   LDLCALC 117 (H) 09/04/2015   LDLCALC 164 (H) 07/18/2015   LDLCALC 95 10/28/2013   Lab Results  Component Value Date   TRIG 146.0 09/04/2015   TRIG 127.0 07/18/2015   TRIG 89.0 10/28/2013   Lab Results  Component Value Date   CHOLHDL 4 09/04/2015   CHOLHDL 5 07/18/2015   CHOLHDL 3 10/28/2013   Lab Results  Component Value Date   LDLDIRECT 178.1 04/20/2013   LDLDIRECT  173.6 05/14/2012   LDLDIRECT  119.7 02/27/2011      LABS:  Lab on 11/29/2015  Component Date Value Ref Range Status  . Hgb A1c MFr Bld 11/29/2015 10.0* 4.6 - 6.5 % Final  . Sodium 11/29/2015 137  135 - 145 mEq/L Final  . Potassium 11/29/2015 5.0  3.5 - 5.1 mEq/L Final  . Chloride 11/29/2015 101  96 - 112 mEq/L Final  . CO2 11/29/2015 27  19 - 32 mEq/L Final  . Glucose, Bld 11/29/2015 272* 70 - 99 mg/dL Final  . BUN 03/47/4259 14  6 - 23 mg/dL Final  . Creatinine, Ser 11/29/2015 0.92  0.40 - 1.50 mg/dL Final  . Calcium 56/38/7564 9.5  8.4 - 10.5 mg/dL Final  . GFR 33/29/5188 94.60  >60.00 mL/min Final    Physical Examination:  BP 120/88 (BP Location: Left Arm, Patient Position: Sitting)   Pulse 71   Wt 174 lb (78.9 kg)   SpO2 97%   BMI 28.96 kg/m      ASSESSMENT:  Diabetes type 2: See history of present illness for detailed discussion of current diabetes management, blood sugar patterns and problems identified He is returning now for follow-up after4 months Although he had done fairly well with Actos and Janumet previously his blood sugars are markedly higher with stopping Actos  Also he has been poorly compliant with diet and exercise, worse than before He had been concerned about weight gain from Actos previously also  Considering that he had excellent control with Actos this should be twice a day medication for him especially with his insulin resistance and need for long term stabilization of his diabetes He does not want to consider a third medication or alternative treatments especially injectable He needs to reduce fat intake, carbohydrates and have balanced meals especially when eating out  Encouraged him to start regular exercise regimen Also needs to start monitoring blood sugars more consistently He refuses to consider consultation with dietitian   He will start back on Actos, using 15 mg for now Will see him back in short-term follow-up in 1 month    HYPERLIPIDEMIA: To have lipid  panel rechecked on his next visit      Shantara Goosby 12/04/2015, 8:54 AM

## 2015-12-23 ENCOUNTER — Other Ambulatory Visit: Payer: Self-pay | Admitting: Endocrinology

## 2016-01-07 ENCOUNTER — Other Ambulatory Visit: Payer: Managed Care, Other (non HMO)

## 2016-01-09 ENCOUNTER — Ambulatory Visit: Payer: Managed Care, Other (non HMO) | Admitting: Endocrinology

## 2016-01-09 DIAGNOSIS — Z0289 Encounter for other administrative examinations: Secondary | ICD-10-CM

## 2016-01-24 ENCOUNTER — Other Ambulatory Visit: Payer: Self-pay | Admitting: Internal Medicine

## 2016-01-29 ENCOUNTER — Ambulatory Visit: Payer: Managed Care, Other (non HMO) | Admitting: Internal Medicine

## 2016-04-09 ENCOUNTER — Other Ambulatory Visit: Payer: Self-pay | Admitting: Endocrinology

## 2016-04-25 ENCOUNTER — Other Ambulatory Visit: Payer: Self-pay

## 2016-04-25 MED ORDER — PIOGLITAZONE HCL 15 MG PO TABS: 15.0000 mg | ORAL_TABLET | Freq: Every day | ORAL | 1 refills | Status: DC

## 2016-05-20 ENCOUNTER — Other Ambulatory Visit: Payer: Self-pay | Admitting: Internal Medicine

## 2016-06-26 ENCOUNTER — Other Ambulatory Visit: Payer: Self-pay | Admitting: Endocrinology

## 2016-06-26 ENCOUNTER — Other Ambulatory Visit: Payer: Self-pay | Admitting: Internal Medicine

## 2016-07-09 ENCOUNTER — Other Ambulatory Visit: Payer: Self-pay | Admitting: Internal Medicine

## 2016-07-12 ENCOUNTER — Other Ambulatory Visit: Payer: Self-pay | Admitting: Internal Medicine

## 2016-07-19 ENCOUNTER — Other Ambulatory Visit: Payer: Self-pay | Admitting: Endocrinology

## 2016-07-26 ENCOUNTER — Other Ambulatory Visit: Payer: Self-pay | Admitting: Endocrinology

## 2016-07-28 NOTE — Telephone Encounter (Signed)
Last ov 12/04/15 2 no-shows and no future scheduled- ok to refill?

## 2016-08-02 ENCOUNTER — Other Ambulatory Visit: Payer: Self-pay | Admitting: Endocrinology

## 2016-09-02 ENCOUNTER — Other Ambulatory Visit: Payer: Self-pay

## 2016-09-02 ENCOUNTER — Telehealth: Payer: Self-pay | Admitting: Endocrinology

## 2016-09-02 ENCOUNTER — Other Ambulatory Visit: Payer: Self-pay | Admitting: Internal Medicine

## 2016-09-02 MED ORDER — SITAGLIPTIN PHOS-METFORMIN HCL 50-1000 MG PO TABS: 1.0000 | ORAL_TABLET | Freq: Two times a day (BID) | ORAL | 1 refills | Status: DC

## 2016-09-02 MED ORDER — ROSUVASTATIN CALCIUM 10 MG PO TABS: 10.0000 mg | ORAL_TABLET | Freq: Every day | ORAL | 0 refills | Status: DC

## 2016-09-02 MED ORDER — GLUCOSE BLOOD VI STRP: ORAL_STRIP | 2 refills | Status: DC

## 2016-09-02 NOTE — Telephone Encounter (Signed)
Refill of  JANUMET 50-1000 MG tablet  [facility] [address]  Phone# [phone]

## 2016-09-02 NOTE — Telephone Encounter (Signed)
Ordered

## 2016-09-02 NOTE — Telephone Encounter (Signed)
Patient requesting at refill of glucose blood test strip. [facility] [address].

## 2016-09-09 LAB — HM DIABETES EYE EXAM

## 2016-09-19 ENCOUNTER — Ambulatory Visit (INDEPENDENT_AMBULATORY_CARE_PROVIDER_SITE_OTHER): Payer: Commercial Managed Care - PPO | Admitting: Endocrinology

## 2016-09-19 ENCOUNTER — Encounter: Payer: Self-pay | Admitting: Endocrinology

## 2016-09-19 VITALS — BP 120/84 | HR 79 | Ht 65.0 in | Wt 178.2 lb

## 2016-09-19 DIAGNOSIS — E1165 Type 2 diabetes mellitus with hyperglycemia: Secondary | ICD-10-CM

## 2016-09-19 LAB — MICROALBUMIN / CREATININE URINE RATIO
Creatinine,U: 147.5 mg/dL
MICROALB UR: 1.6 mg/dL (ref 0.0–1.9)
Microalb Creat Ratio: 1.1 mg/g (ref 0.0–30.0)

## 2016-09-19 LAB — POCT GLYCOSYLATED HEMOGLOBIN (HGB A1C): Hemoglobin A1C: 6.4

## 2016-09-19 NOTE — Progress Notes (Signed)
Shawn Walter           Reason for Appointment : Followup for Type 2 Diabetes  History of Present Illness          Diagnosis: Type 2 diabetes mellitus, date of diagnosis: 2004        Past history: His glucose was about 220 at diagnosis. He thinks he was not treated with medications but told to modify his diet and start exercise. Not clear how his diabetes was controlled with this regimen He thinks about 3 years ago he was started on metformin, probably when his A1c was 8.2%. Blood sugar was somewhat better but his A1c at best was 7.1% Although he had some initial improvement with metformin initially his blood sugars have been generally higher this year especially in 1/14 At some point he was also given Amaryl 4 mg but this was stopped when he was having low blood sugars Prior to his initial consultation he had been totally noncompliant with diet, exercise and glucose monitoring. He  probably gained weight also. He was on Janumet and Actos but his glucose on the lab was 348 along with A1c of 9.5  Recent history:   He is currently on Janumet 50/1000 twice a day and Actos 15 mg daily  Has not been seen in follow-up since 7/17  His A1c was markedly higher last year at 10%, now down to 6.4  Current problems, management and glycemic control:  Despite not coming back for almost a year he only started checking his blood sugars at home about 2 weeks ago and does not know what they have been over the last several months  He appears to have overall good control as is by his A1c at home blood sugars which are averaging 128  However not checking POSTPRANDIAL readings after any meal  His fasting blood sugars are mildly increased and has only one relatively high reading at suppertime of 159  He has been exercising recently although weight is slightly higher, this has been fluctuating and not as high as in 3/17    Glucose monitoring:  less than once a day       Glucometer: Accu-Chek nano      Blood Glucose readings:  As above fasting median 135 and evening/before supper median about 110           Self-care:   Meals: 3 meals per day.  He is trying to avoid rice which he thinks raises his blood sugars   Physical activity: exercise: run/ walk on treadmill 30 minutes, 5 days a week         Dietician visit: Most recent: At the time of diagnosis                  Wt Readings from Last 3 Encounters:  09/19/16 178 lb 3.2 oz (80.8 kg)  12/04/15 174 lb (78.9 kg)  07/31/15 185 lb 6.4 oz (84.1 kg)    Lab Results  Component Value Date   HGBA1C 6.4 09/19/2016   HGBA1C 10.0 (H) 11/29/2015   HGBA1C 6.1 07/18/2015   Lab Results  Component Value Date   MICROALBUR 1.6 09/19/2016   LDLCALC 117 (H) 09/04/2015   CREATININE 0.92 11/29/2015   Office Visit on 09/19/2016  Component Date Value Ref Range Status  . Hemoglobin A1C 09/19/2016 6.4   Final  . Microalb, Ur 09/19/2016 1.6  0.0 - 1.9 mg/dL Final  . Creatinine,U 16/02/9603 147.5  mg/dL Final  . Microalb Creat Ratio  09/19/2016 1.1  0.0 - 30.0 mg/g Final     Allergies as of 09/19/2016   No Known Allergies     Medication List       Accurate as of 09/19/16 11:59 PM. Always use your most recent med list.          aspirin 81 MG tablet Take 81 mg by mouth daily.   glucose blood test strip Check blood sugar no more than twice daily.   onetouch ultrasoft lancets Use as instructed   pioglitazone 15 MG tablet Commonly known as:  ACTOS Take 1 tablet (15 mg total) by mouth daily.   rosuvastatin 10 MG tablet Commonly known as:  CRESTOR Take 1 tablet (10 mg total) by mouth daily.   sitaGLIPtin-metformin 50-1000 MG tablet Commonly known as:  JANUMET Take 1 tablet by mouth 2 (two) times daily.         Allergies: No Known Allergies  Past Medical History:  Diagnosis Date  . Diabetes mellitus   . Hyperlipidemia   . Normal cardiac stress test 02-2011   . Renal cyst, right    Complex, Dr. Marlou Porch  . Urolithiasis  11-2013   L    Past Surgical History:  Procedure Laterality Date  . INCISE AND DRAIN ABCESS  07/11/11   perirectal abscess     Family History  Problem Relation Age of Onset  . Breast cancer Mother   . Hypertension Neg Hx   . Hyperlipidemia Neg Hx   . Heart attack Neg Hx   . Diabetes Neg Hx   . Colon cancer Neg Hx   . Prostate cancer Neg Hx   . Sudden death Brother     Social History:  reports that he quit smoking about 17 years ago. He has never used smokeless tobacco. He reports that he drinks alcohol. He reports that he does not use drugs.    Review of Systems   No history of hypertension or microalbuminuria       Lipids: He has been treated with Crestor, is due for his follow-up with ECP 4 annual exam and labs  There is a history of sudden death in his family with his brother dying at the age of 66     Lab Results  Component Value Date   CHOL 190 09/04/2015   CHOL 235 (H) 07/18/2015   CHOL 160 10/28/2013   Lab Results  Component Value Date   HDL 44.20 09/04/2015   HDL 45.90 07/18/2015   HDL 47.00 10/28/2013   Lab Results  Component Value Date   LDLCALC 117 (H) 09/04/2015   LDLCALC 164 (H) 07/18/2015   LDLCALC 95 10/28/2013   Lab Results  Component Value Date   TRIG 146.0 09/04/2015   TRIG 127.0 07/18/2015   TRIG 89.0 10/28/2013   Lab Results  Component Value Date   CHOLHDL 4 09/04/2015   CHOLHDL 5 07/18/2015   CHOLHDL 3 10/28/2013   Lab Results  Component Value Date   LDLDIRECT 178.1 04/20/2013   LDLDIRECT 173.6 05/14/2012   LDLDIRECT 119.7 02/27/2011      LABS:  Office Visit on 09/19/2016  Component Date Value Ref Range Status  . Hemoglobin A1C 09/19/2016 6.4   Final  . Microalb, Ur 09/19/2016 1.6  0.0 - 1.9 mg/dL Final  . Creatinine,U 16/02/9603 147.5  mg/dL Final  . Microalb Creat Ratio 09/19/2016 1.1  0.0 - 30.0 mg/g Final    Physical Examination:  BP 120/84   Pulse 79   Ht 5'  5" (1.651 m)   Wt 178 lb 3.2 oz (80.8 kg)    SpO2 98%   BMI 29.65 kg/m      ASSESSMENT:  Diabetes type 2: See history of present illness for detailed discussion of current diabetes management, blood sugar patterns and problems identified He is returning now for follow-up after Almost a year and appears to be inconsistent with his follow-up His blood sugars have improved significantly with adding back Actos last year and increase in Janumet to twice a day Has gained a little weight but only a few pounds compared to baseline weight He does try to exercise more recently He thinks he can be consistent with diet, and does not want to see the dietitian  Reminded him to check blood sugars after meals and discussed blood sugar targets Needs to have regular follow-up    HYPERLIPIDEMIA: To have lipid panel rechecked on his visit with PCP    Patient Instructions  Check blood sugars on waking up  2x weekly  Also check blood sugars about 2 hours after a meal and do this after different meals by rotation  Recommended blood sugar levels on waking up is 90-130 and about 2 hours after meal is 130-160   Please bring your blood sugar monitor to each visit, thank you        [facility] Center[facility]UMAR,Shawn Walter 09/21/2016, 8:40 PM

## 2016-09-19 NOTE — Patient Instructions (Addendum)
Check blood sugars on waking up  2x weekly  Also check blood sugars about 2 hours after a meal and do this after different meals by rotation  Recommended blood sugar levels on waking up is 90-130 and about 2 hours after meal is 130-160  Please bring your blood sugar monitor to each visit, thank you  

## 2016-09-29 ENCOUNTER — Other Ambulatory Visit: Payer: Self-pay | Admitting: Internal Medicine

## 2016-10-07 ENCOUNTER — Ambulatory Visit (INDEPENDENT_AMBULATORY_CARE_PROVIDER_SITE_OTHER): Payer: Commercial Managed Care - PPO | Admitting: Internal Medicine

## 2016-10-07 ENCOUNTER — Encounter: Payer: Self-pay | Admitting: Internal Medicine

## 2016-10-07 VITALS — BP 124/74 | HR 61 | Temp 98.1°F | Resp 14 | Ht 65.0 in | Wt 177.2 lb

## 2016-10-07 DIAGNOSIS — E785 Hyperlipidemia, unspecified: Secondary | ICD-10-CM

## 2016-10-07 DIAGNOSIS — Z0000 Encounter for general adult medical examination without abnormal findings: Secondary | ICD-10-CM | POA: Diagnosis not present

## 2016-10-07 DIAGNOSIS — Z23 Encounter for immunization: Secondary | ICD-10-CM

## 2016-10-07 LAB — CBC WITH DIFFERENTIAL/PLATELET
BASOS ABS: 0.1 10*3/uL (ref 0.0–0.1)
Basophils Relative: 1.1 % (ref 0.0–3.0)
Eosinophils Absolute: 0.2 10*3/uL (ref 0.0–0.7)
Eosinophils Relative: 2.1 % (ref 0.0–5.0)
HEMATOCRIT: 41.3 % (ref 39.0–52.0)
Hemoglobin: 14.1 g/dL (ref 13.0–17.0)
LYMPHS PCT: 34.1 % (ref 12.0–46.0)
Lymphs Abs: 2.5 10*3/uL (ref 0.7–4.0)
MCHC: 34.2 g/dL (ref 30.0–36.0)
MCV: 89.4 fl (ref 78.0–100.0)
MONO ABS: 0.6 10*3/uL (ref 0.1–1.0)
Monocytes Relative: 8.2 % (ref 3.0–12.0)
NEUTROS ABS: 4 10*3/uL (ref 1.4–7.7)
NEUTROS PCT: 54.5 % (ref 43.0–77.0)
Platelets: 256 10*3/uL (ref 150.0–400.0)
RBC: 4.62 Mil/uL (ref 4.22–5.81)
RDW: 13.1 % (ref 11.5–15.5)
WBC: 7.4 10*3/uL (ref 4.0–10.5)

## 2016-10-07 LAB — COMPREHENSIVE METABOLIC PANEL
ALT: 25 U/L (ref 0–53)
AST: 25 U/L (ref 0–37)
Albumin: 4.5 g/dL (ref 3.5–5.2)
Alkaline Phosphatase: 56 U/L (ref 39–117)
BUN: 13 mg/dL (ref 6–23)
CHLORIDE: 102 meq/L (ref 96–112)
CO2: 27 meq/L (ref 19–32)
CREATININE: 0.88 mg/dL (ref 0.40–1.50)
Calcium: 9.3 mg/dL (ref 8.4–10.5)
GFR: 99.2 mL/min (ref >60.00)
Glucose, Bld: 118 mg/dL — ABNORMAL HIGH (ref 70–99)
Potassium: 4.5 mEq/L (ref 3.5–5.1)
SODIUM: 138 meq/L (ref 135–145)
Total Bilirubin: 0.6 mg/dL (ref 0.2–1.2)
Total Protein: 8.1 g/dL (ref 6.0–8.3)

## 2016-10-07 LAB — LIPID PANEL
Cholesterol: 184 mg/dL (ref 0–200)
HDL: 49.3 mg/dL (ref >39.00)
LDL CALC: 114 mg/dL — AB (ref 0–99)
NonHDL: 134.95
Total CHOL/HDL Ratio: 4
Triglycerides: 107 mg/dL (ref 0.0–149.0)
VLDL: 21.4 mg/dL (ref 0.0–40.0)

## 2016-10-07 LAB — TSH: TSH: 3.4 u[IU]/mL (ref 0.35–4.50)

## 2016-10-07 NOTE — Assessment & Plan Note (Addendum)
--  Tdap 09-2016; pnm shot 2017; prevnar at age 46  -- JWJ:XBJYNCCS:Never had a cscope  --prostate ca screening: n/a --Diet and exercise discussed  --Labs: CMP, FLP, CBC, TSH

## 2016-10-07 NOTE — Progress Notes (Signed)
Pre visit review using our clinic review tool, if applicable. No additional management support is needed unless otherwise documented below in the visit note. 

## 2016-10-07 NOTE — Assessment & Plan Note (Signed)
DM: Per endo, doing great, last A1c excellent. Hyperlipidemia: On Crestor  10 mg  qd, reports good compliance. Recheck labs today. Urology: Sees Dr. Marlou PorchHerrick from time to time RTC one year

## 2016-10-07 NOTE — Progress Notes (Signed)
Subjective:    Patient ID: Shawn Walter, male    DOB: 08/07/1970, 46 y.o.   MRN: [id number]  DOS:  10/07/2016 Type of visit - description : CPX Interval history: No major concerns. Has noted that clear relationship between eating healthy - exercise and lower CBGs.   Review of Systems  A 14 point review of systems is negative     Past Medical History:  Diagnosis Date  . Diabetes mellitus   . Hyperlipidemia   . Normal cardiac stress test 02-2011   . Renal cyst, right    Complex, Dr. Marlou PorchHerrick  . Urolithiasis 11-2013   L    Past Surgical History:  Procedure Laterality Date  . INCISE AND DRAIN ABCESS  07/11/11   perirectal abscess     Social History   Social History  . Marital status: Married    Spouse name: N/A  . Number of children: 2  . Years of education: N/A   Occupational History  . works at [employer], computers [employer], [employer]   Social History Main Topics  . Smoking status: Former Smoker    Quit date: 05/13/1999  . Smokeless tobacco: Never Used  . Alcohol use Yes     Comment: socially   . Drug use: No  . Sexual activity: Not on file   Other Topics Concern  . Not on file   Social History Narrative   Mar[place]     children x 2  (2004, 2010)      Family History  Problem Relation Age of Onset  . Breast cancer Mother   . Sudden death Brother   . Hypertension Neg Hx   . Hyperlipidemia Neg Hx   . Heart attack Neg Hx   . Diabetes Neg Hx   . Colon cancer Neg Hx   . Prostate cancer Neg Hx      Allergies as of 10/07/2016   No Known Allergies     Medication List       Accurate as of 10/07/16  2:56 PM. Always use your most recent med list.          aspirin 81 MG tablet Take 81 mg by mouth daily.   glucose blood test strip Check blood sugar no more than twice daily.   onetouch ultrasoft lancets Use as instructed   pioglitazone 15 MG tablet Commonly known as:  ACTOS Take 1 tablet (15 mg total) by mouth daily.   rosuvastatin 10  MG tablet Commonly known as:  CRESTOR Take 1 tablet (10 mg total) by mouth daily.   sitaGLIPtin-metformin 50-1000 MG tablet Commonly known as:  JANUMET Take 1 tablet by mouth 2 (two) times daily.          Objective:   Physical Exam BP 124/74 (BP Location: Left Arm, Patient Position: Sitting, Cuff Size: Normal)   Pulse 61   Temp 98.1 F (36.7 C) (Oral)   Resp 14   Ht 5\' 5"  (1.651 m)   Wt 177 lb 4 oz (80.4 kg)   SpO2 98%   BMI 29.50 kg/m   General:   Well developed, well nourished . NAD.  Neck: No  thyromegaly  HEENT:  Normocephalic . Face symmetric, atraumatic Lungs:  CTA B Normal respiratory effort, no intercostal retractions, no accessory muscle use. Heart: RRR,  no murmur.  No pretibial edema bilaterally  Abdomen:  Not distended, soft, non-tender. No rebound or rigidity.   Skin: Exposed areas without rash. Not pale. Not jaundice Neurologic:  alert & oriented X3.  Speech normal, gait appropriate for age and unassisted Strength symmetric and appropriate for age.  Psych: Cognition and judgment appear intact.  Cooperative with normal attention span and concentration.  Behavior appropriate. No anxious or depressed appearing.    Assessment & Plan:   Assessment DM: Dr Lucianne Muss  Hyperlipidemia Urolithiasis  --L--  11-2013 Right renal cyst, complex, Dr. Marlou Porch Pyelonephritis 09-2014 Stress test (-) 2012  PLAN  DM: Per endo, doing great, last A1c excellent. Hyperlipidemia: On Crestor  10 mg  qd, reports good compliance. Recheck labs today. Urology: Sees Dr. Marlou Porch from time to time RTC one year

## 2016-10-07 NOTE — Patient Instructions (Signed)
GO TO THE LAB : Get the blood work     GO TO THE FRONT DESK Schedule your next appointment for a  physical exam in one year  

## 2016-10-09 MED ORDER — ROSUVASTATIN CALCIUM 20 MG PO TABS: 20.0000 mg | ORAL_TABLET | Freq: Every day | ORAL | 6 refills | Status: DC

## 2016-10-09 NOTE — Addendum Note (Signed)
Addended byConrad [place]: Dayln Tugwell D on: 10/09/2016 02:08 PM   Modules accepted: Orders

## 2016-11-11 ENCOUNTER — Other Ambulatory Visit: Payer: Self-pay | Admitting: Endocrinology

## 2017-03-02 ENCOUNTER — Other Ambulatory Visit: Payer: Self-pay | Admitting: Endocrinology

## 2017-04-22 ENCOUNTER — Other Ambulatory Visit: Payer: Self-pay

## 2017-04-22 ENCOUNTER — Telehealth: Payer: Self-pay | Admitting: Endocrinology

## 2017-04-22 MED ORDER — SITAGLIPTIN PHOS-METFORMIN HCL 50-1000 MG PO TABS: 1.0000 | ORAL_TABLET | Freq: Two times a day (BID) | ORAL | 1 refills | Status: DC

## 2017-04-22 NOTE — Telephone Encounter (Signed)
[facility] [address]

## 2017-04-22 NOTE — Telephone Encounter (Signed)
[facility] [address]

## 2017-05-14 ENCOUNTER — Other Ambulatory Visit: Payer: Self-pay | Admitting: Endocrinology

## 2017-05-14 ENCOUNTER — Other Ambulatory Visit: Payer: Self-pay | Admitting: Internal Medicine

## 2017-05-19 ENCOUNTER — Other Ambulatory Visit: Payer: Self-pay | Admitting: Endocrinology

## 2017-05-19 DIAGNOSIS — E785 Hyperlipidemia, unspecified: Secondary | ICD-10-CM

## 2017-05-19 DIAGNOSIS — E1165 Type 2 diabetes mellitus with hyperglycemia: Secondary | ICD-10-CM

## 2017-05-25 ENCOUNTER — Other Ambulatory Visit: Payer: Commercial Managed Care - PPO

## 2017-05-28 ENCOUNTER — Ambulatory Visit: Payer: Commercial Managed Care - PPO | Admitting: Endocrinology

## 2017-07-06 ENCOUNTER — Other Ambulatory Visit: Payer: Self-pay

## 2017-07-06 MED ORDER — ROSUVASTATIN CALCIUM 20 MG PO TABS: 20.0000 mg | ORAL_TABLET | Freq: Every day | ORAL | 0 refills | Status: DC

## 2017-09-09 DIAGNOSIS — E119 Type 2 diabetes mellitus without complications: Secondary | ICD-10-CM | POA: Diagnosis not present

## 2017-09-30 ENCOUNTER — Encounter: Payer: Self-pay | Admitting: Internal Medicine

## 2017-10-02 ENCOUNTER — Ambulatory Visit: Payer: Commercial Managed Care - PPO | Admitting: Internal Medicine

## 2017-10-02 ENCOUNTER — Encounter: Payer: Self-pay | Admitting: Internal Medicine

## 2017-10-09 ENCOUNTER — Other Ambulatory Visit: Payer: Self-pay | Admitting: Endocrinology

## 2017-10-22 ENCOUNTER — Other Ambulatory Visit: Payer: Self-pay | Admitting: Endocrinology

## 2017-10-22 ENCOUNTER — Other Ambulatory Visit (INDEPENDENT_AMBULATORY_CARE_PROVIDER_SITE_OTHER): Payer: Self-pay

## 2017-10-22 DIAGNOSIS — E785 Hyperlipidemia, unspecified: Secondary | ICD-10-CM

## 2017-10-22 DIAGNOSIS — E1165 Type 2 diabetes mellitus with hyperglycemia: Secondary | ICD-10-CM

## 2017-10-22 LAB — COMPREHENSIVE METABOLIC PANEL
ALBUMIN: 4.2 g/dL (ref 3.5–5.2)
ALK PHOS: 67 U/L (ref 39–117)
ALT: 43 U/L (ref 0–53)
AST: 32 U/L (ref 0–37)
BUN: 10 mg/dL (ref 6–23)
CO2: 27 mEq/L (ref 19–32)
CREATININE: 0.84 mg/dL (ref 0.40–1.50)
Calcium: 9.4 mg/dL (ref 8.4–10.5)
Chloride: 104 mEq/L (ref 96–112)
GFR: 104.19 mL/min (ref >60.00)
Glucose, Bld: 170 mg/dL — ABNORMAL HIGH (ref 70–99)
Potassium: 5.2 mEq/L — ABNORMAL HIGH (ref 3.5–5.1)
SODIUM: 138 meq/L (ref 135–145)
TOTAL PROTEIN: 7.6 g/dL (ref 6.0–8.3)
Total Bilirubin: 0.6 mg/dL (ref 0.2–1.2)

## 2017-10-22 LAB — LIPID PANEL
CHOLESTEROL: 163 mg/dL (ref 0–200)
HDL: 48.3 mg/dL (ref >39.00)
LDL Cholesterol: 93 mg/dL (ref 0–99)
NonHDL: 114.62
Total CHOL/HDL Ratio: 3
Triglycerides: 109 mg/dL (ref 0.0–149.0)
VLDL: 21.8 mg/dL (ref 0.0–40.0)

## 2017-10-22 LAB — HEMOGLOBIN A1C: Hgb A1c MFr Bld: 8.3 % — ABNORMAL HIGH (ref 4.6–6.5)

## 2017-10-27 ENCOUNTER — Other Ambulatory Visit: Payer: Self-pay

## 2017-10-27 MED ORDER — SITAGLIPTIN PHOS-METFORMIN HCL 50-1000 MG PO TABS: 1.0000 | ORAL_TABLET | Freq: Two times a day (BID) | ORAL | 1 refills | Status: DC

## 2017-10-27 MED FILL — JANUMET 50-1,000 MG TABLET: 50-1000 | 90 days supply | Qty: 180 | Fill #0

## 2017-10-27 MED FILL — PIOGLITAZONE HCL 15 MG TAB: 15 | 60 days supply | Qty: 60 | Fill #0

## 2017-10-28 MED FILL — ROSUVASTATIN CALCIUM 20 MG: 20 | 30 days supply | Qty: 30 | Fill #0

## 2017-11-02 ENCOUNTER — Encounter: Payer: Self-pay | Admitting: Endocrinology

## 2017-11-02 ENCOUNTER — Ambulatory Visit: Payer: 59 | Admitting: Endocrinology

## 2017-11-02 VITALS — BP 138/80 | HR 82 | Ht 65.0 in | Wt 181.4 lb

## 2017-11-02 DIAGNOSIS — E1165 Type 2 diabetes mellitus with hyperglycemia: Secondary | ICD-10-CM | POA: Diagnosis not present

## 2017-11-02 NOTE — Patient Instructions (Addendum)
Check blood sugars on waking up 2-3/7   Also check blood sugars about 2 hours after a meal and do this after different meals by rotation  Recommended blood sugar levels on waking up is 90-130 and about 2 hours after meal is 130-160  Please bring your blood sugar monitor to each visit, thank you  Exercise 5/7

## 2017-11-02 NOTE — Progress Notes (Signed)
Shawn Walter 47 y.o.            Reason for Appointment : Followup for Type 2 Diabetes  History of Present Illness          Diagnosis: Type 2 diabetes mellitus, date of diagnosis: 2004        Past history: His glucose was about 220 at diagnosis. He thinks he was not treated with medications but told to modify his diet and start exercise. Not clear how his diabetes was controlled with this regimen He thinks about 3 years ago he was started on metformin, probably when his A1c was 8.2%. Blood sugar was somewhat better but his A1c at best was 7.1% Although he had some initial improvement with metformin initially his blood sugars have been generally higher this year especially in 1/14 At some point he was also given Amaryl 4 mg but this was stopped when he was having low blood sugars Prior to his initial consultation he had been totally noncompliant with diet, exercise and glucose monitoring. He  probably gained weight also. He was on Janumet and Actos but his glucose on the lab was 348 along with A1c of 9.5  Recent history:   He is currently on Janumet 50/1000 twice a day and Actos 15 mg daily  Has not been seen in follow-up since May 2018  His A1c was down to 6.4 previously but now back up to 8.3  Current problems, management and blood sugar levels:  Despite repeated reminders he does not come back regularly for follow-up  He has not checked his blood sugars in a few months  He does not recently watch his diet and thinks this is partly from eating more sweets and also eating out frequently with his work  Also he is very irregular with his exercise and he thinks this is because of his traveling more  Lab glucose was 170 before lunch  His weight is gone up about 4 pounds  He says that he does not want to take more medications and would like to stop taking as many medications as possible    Glucose monitoring:  less than once a day       Glucometer: Accu-Chek nano     Blood  Glucose readings: none           Self-care:   Meals: 3 meals per day.  He is trying to avoid rice which he thinks raises his blood sugars   Physical activity: exercise: was on treadmill 30 minutes, 5 days a week         Dietician visit: Most recent: At the time of diagnosis                  Wt Readings from Last 3 Encounters:  11/02/17 181 lb 6.4 oz (82.3 kg)  10/07/16 177 lb 4 oz (80.4 kg)  09/19/16 178 lb 3.2 oz (80.8 kg)    Lab Results  Component Value Date   HGBA1C 8.3 (H) 10/22/2017   HGBA1C 6.4 09/19/2016   HGBA1C 10.0 (H) 11/29/2015   Lab Results  Component Value Date   MICROALBUR 1.6 09/19/2016   LDLCALC 93 10/22/2017   CREATININE 0.84 10/22/2017   No visits with results within 1 Week(s) from this visit.  Latest known visit with results is:  Lab on 10/22/2017  Component Date Value Ref Range Status  . Cholesterol 10/22/2017 163  0 - 200 mg/dL Final   ATP III Classification  Desirable:  < 200 mg/dL               Borderline High:  200 - 239 mg/dL          High:  > = 009240 mg/dL  . Triglycerides 10/22/2017 109.0  0.0 - 149.0 mg/dL Final   Normal:  <233<150 mg/dLBorderline High:  150 - 199 mg/dL  . HDL 10/22/2017 48.30  >39.00 mg/dL Final  . VLDL 00/76/226306/13/2019 21.8  0.0 - 40.0 mg/dL Final  . LDL Cholesterol 10/22/2017 93  0 - 99 mg/dL Final  . Total CHOL/HDL Ratio 10/22/2017 3   Final                  Men          Women1/2 Average Risk     3.4          3.3Average Risk          5.0          4.42X Average Risk          9.6          7.13X Average Risk          15.0          11.0                      . NonHDL 10/22/2017 114.62   Final   NOTE:  Non-HDL goal should be 30 mg/dL higher than patient's LDL goal (i.e. LDL goal of < 70 mg/dL, would have non-HDL goal of < 100 mg/dL)  . Sodium 10/22/2017 138  135 - 145 mEq/L Final  . Potassium 10/22/2017 5.2* 3.5 - 5.1 mEq/L Final  . Chloride 10/22/2017 104  96 - 112 mEq/L Final  . CO2 10/22/2017 27  19 - 32 mEq/L Final  . Glucose,  Bld 10/22/2017 170* 70 - 99 mg/dL Final  . BUN 33/54/562506/13/2019 10  6 - 23 mg/dL Final  . Creatinine, Ser 10/22/2017 0.84  0.40 - 1.50 mg/dL Final  . Total Bilirubin 10/22/2017 0.6  0.2 - 1.2 mg/dL Final  . Alkaline Phosphatase 10/22/2017 67  39 - 117 U/L Final  . AST 10/22/2017 32  0 - 37 U/L Final  . ALT 10/22/2017 43  0 - 53 U/L Final  . Total Protein 10/22/2017 7.6  6.0 - 8.3 g/dL Final  . Albumin 63/89/373406/13/2019 4.2  3.5 - 5.2 g/dL Final  . Calcium 28/76/811506/13/2019 9.4  8.4 - 10.5 mg/dL Final  . GFR 72/62/035506/13/2019 104.19  >60.00 mL/min Final  . Hgb A1c MFr Bld 10/22/2017 8.3* 4.6 - 6.5 % Final   Glycemic Control Guidelines for People with Diabetes:Non Diabetic:  <6%Goal of Therapy: <7%Additional Action Suggested:  >8%      Allergies as of 11/02/2017   No Known Allergies     Medication List        Accurate as of 11/02/17  3:23 PM. Always use your most recent med list.          glucose blood test strip Check blood sugar no more than twice daily.   onetouch ultrasoft lancets Use as instructed   pioglitazone 15 MG tablet Commonly known as:  ACTOS TAKE 1 TABLET (15 MG TOTAL) BY MOUTH DAILY.   rosuvastatin 20 MG tablet Commonly known as:  CRESTOR Take 1 tablet (20 mg total) by mouth daily.   sitaGLIPtin-metformin 50-1000 MG tablet Commonly known as:  JANUMET Take 1 tablet by mouth 2 (  two) times daily.         Allergies: No Known Allergies  Past Medical History:  Diagnosis Date  . Diabetes mellitus   . Hyperlipidemia   . Normal cardiac stress test 02-2011   . Renal cyst, right    Complex, Dr. Marlou Porch  . Urolithiasis 11-2013   L    Past Surgical History:  Procedure Laterality Date  . INCISE AND DRAIN ABCESS  07/11/11   perirectal abscess     Family History  Problem Relation Age of Onset  . Breast cancer Mother   . Sudden death Brother   . Hypertension Neg Hx   . Hyperlipidemia Neg Hx   . Heart attack Neg Hx   . Diabetes Neg Hx   . Colon cancer Neg Hx   . Prostate  cancer Neg Hx     Social History:  reports that he quit smoking about 18 years ago. He has never used smokeless tobacco. He reports that he drinks alcohol. He reports that he does not use drugs.    Review of Systems   No history of hypertension       Lipids: He has been treated with Crestor by PCP, recently more regular with this  There is a history of sudden death in his family with his brother dying at the age of 51     Lab Results  Component Value Date   CHOL 163 10/22/2017   CHOL 184 10/07/2016   CHOL 190 09/04/2015   Lab Results  Component Value Date   HDL 48.30 10/22/2017   HDL 49.30 10/07/2016   HDL 44.20 09/04/2015   Lab Results  Component Value Date   LDLCALC 93 10/22/2017   LDLCALC 114 (H) 10/07/2016   LDLCALC 117 (H) 09/04/2015   Lab Results  Component Value Date   TRIG 109.0 10/22/2017   TRIG 107.0 10/07/2016   TRIG 146.0 09/04/2015   Lab Results  Component Value Date   CHOLHDL 3 10/22/2017   CHOLHDL 4 10/07/2016   CHOLHDL 4 09/04/2015   Lab Results  Component Value Date   LDLDIRECT 178.1 04/20/2013   LDLDIRECT 173.6 05/14/2012   LDLDIRECT 119.7 02/27/2011      LABS:  No visits with results within 1 Week(s) from this visit.  Latest known visit with results is:  Lab on 10/22/2017  Component Date Value Ref Range Status  . Cholesterol 10/22/2017 163  0 - 200 mg/dL Final   ATP III Classification       Desirable:  < 200 mg/dL               Borderline High:  200 - 239 mg/dL          High:  > = 914 mg/dL  . Triglycerides 10/22/2017 109.0  0.0 - 149.0 mg/dL Final   Normal:  <782 mg/dLBorderline High:  150 - 199 mg/dL  . HDL 10/22/2017 48.30  >39.00 mg/dL Final  . VLDL 95/62/1308 21.8  0.0 - 40.0 mg/dL Final  . LDL Cholesterol 10/22/2017 93  0 - 99 mg/dL Final  . Total CHOL/HDL Ratio 10/22/2017 3   Final                  Men          Women1/2 Average Risk     3.4          3.3Average Risk          5.0  4.42X Average Risk          9.6           7.13X Average Risk          15.0          11.0                      . NonHDL 10/22/2017 114.62   Final   NOTE:  Non-HDL goal should be 30 mg/dL higher than patient's LDL goal (i.e. LDL goal of < 70 mg/dL, would have non-HDL goal of < 100 mg/dL)  . Sodium 10/22/2017 138  135 - 145 mEq/L Final  . Potassium 10/22/2017 5.2* 3.5 - 5.1 mEq/L Final  . Chloride 10/22/2017 104  96 - 112 mEq/L Final  . CO2 10/22/2017 27  19 - 32 mEq/L Final  . Glucose, Bld 10/22/2017 170* 70 - 99 mg/dL Final  . BUN 16/02/9603 10  6 - 23 mg/dL Final  . Creatinine, Ser 10/22/2017 0.84  0.40 - 1.50 mg/dL Final  . Total Bilirubin 10/22/2017 0.6  0.2 - 1.2 mg/dL Final  . Alkaline Phosphatase 10/22/2017 67  39 - 117 U/L Final  . AST 10/22/2017 32  0 - 37 U/L Final  . ALT 10/22/2017 43  0 - 53 U/L Final  . Total Protein 10/22/2017 7.6  6.0 - 8.3 g/dL Final  . Albumin 54/01/8118 4.2  3.5 - 5.2 g/dL Final  . Calcium 14/78/2956 9.4  8.4 - 10.5 mg/dL Final  . GFR 21/30/8657 104.19  >60.00 mL/min Final  . Hgb A1c MFr Bld 10/22/2017 8.3* 4.6 - 6.5 % Final   Glycemic Control Guidelines for People with Diabetes:Non Diabetic:  <6%Goal of Therapy: <7%Additional Action Suggested:  >8%     Physical Examination:  BP 138/80 (BP Location: Left Arm, Patient Position: Sitting, Cuff Size: Normal)   Pulse 82   Ht 5\' 5"  (1.651 m)   Wt 181 lb 6.4 oz (82.3 kg)   SpO2 98%   BMI 30.19 kg/m   Diabetic Foot Exam - Simple   Simple Foot Form Diabetic Foot exam was performed with the following findings:  Yes   Visual Inspection No deformities, no ulcerations, no other skin breakdown bilaterally:  Yes Sensation Testing Intact to touch and monofilament testing bilaterally:  Yes Pulse Check Posterior Tibialis and Dorsalis pulse intact bilaterally:  Yes Comments       ASSESSMENT:  Diabetes type 2, now BMI 30: See history of present illness for discussion of current diabetes management, blood sugar patterns and problems  identified  He is returning now for follow-up after a year and appears to be inconsistent with his follow-up His blood sugars have been poorly controlled with A1c over 8% Discussed with the patient the implications of persistent poor control and A1c target of at least under 7%  Ideally he should be on a GLP-1 drug or SGLT2 drug for more effective and consistent management and maintaining weight loss  He thinks he can be doing much better with his diet and exercise and refuses to consider even increasing the dose of Actos or adding another medication He is agreeable to coming back in 3 months for reassessment but also to call if his blood sugars are persistently high He will check his blood sugars at least 3-4 times a week at different times of the day  Needs to have regular follow-up for continued care and refills on prescriptions    HYPERLIPIDEMIA: To continue Crestor  and observe low saturated fat diet  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

## 2017-11-03 ENCOUNTER — Encounter: Payer: Self-pay | Admitting: Internal Medicine

## 2017-11-03 ENCOUNTER — Ambulatory Visit (INDEPENDENT_AMBULATORY_CARE_PROVIDER_SITE_OTHER): Payer: 59 | Admitting: Internal Medicine

## 2017-11-03 VITALS — BP 124/66 | HR 78 | Temp 98.5°F | Resp 16 | Ht 65.0 in | Wt 179.4 lb

## 2017-11-03 DIAGNOSIS — E875 Hyperkalemia: Secondary | ICD-10-CM | POA: Diagnosis not present

## 2017-11-03 DIAGNOSIS — Z0000 Encounter for general adult medical examination without abnormal findings: Secondary | ICD-10-CM

## 2017-11-03 DIAGNOSIS — E1165 Type 2 diabetes mellitus with hyperglycemia: Secondary | ICD-10-CM

## 2017-11-03 LAB — BASIC METABOLIC PANEL
BUN: 10 mg/dL (ref 6–23)
CALCIUM: 9.2 mg/dL (ref 8.4–10.5)
CO2: 25 mEq/L (ref 19–32)
CREATININE: 0.82 mg/dL (ref 0.40–1.50)
Chloride: 100 mEq/L (ref 96–112)
GFR: 107.11 mL/min (ref >60.00)
GLUCOSE: 287 mg/dL — AB (ref 70–99)
Potassium: 3.8 mEq/L (ref 3.5–5.1)
Sodium: 135 mEq/L (ref 135–145)

## 2017-11-03 LAB — CBC WITH DIFFERENTIAL/PLATELET
BASOS ABS: 0.1 10*3/uL (ref 0.0–0.1)
Basophils Relative: 1.1 % (ref 0.0–3.0)
EOS ABS: 0.3 10*3/uL (ref 0.0–0.7)
Eosinophils Relative: 5 % (ref 0.0–5.0)
HEMATOCRIT: 41.9 % (ref 39.0–52.0)
HEMOGLOBIN: 14.4 g/dL (ref 13.0–17.0)
LYMPHS ABS: 1.9 10*3/uL (ref 0.7–4.0)
Lymphocytes Relative: 33 % (ref 12.0–46.0)
MCHC: 34.5 g/dL (ref 30.0–36.0)
MCV: 88.5 fl (ref 78.0–100.0)
MONO ABS: 0.5 10*3/uL (ref 0.1–1.0)
Monocytes Relative: 8 % (ref 3.0–12.0)
NEUTROS ABS: 3.1 10*3/uL (ref 1.4–7.7)
Neutrophils Relative %: 52.9 % (ref 43.0–77.0)
PLATELETS: 261 10*3/uL (ref 150.0–400.0)
RBC: 4.73 Mil/uL (ref 4.22–5.81)
RDW: 13.3 % (ref 11.5–15.5)
WBC: 5.9 10*3/uL (ref 4.0–10.5)

## 2017-11-03 LAB — TSH: TSH: 2.46 u[IU]/mL (ref 0.35–4.50)

## 2017-11-03 NOTE — Assessment & Plan Note (Addendum)
--  Tdap 09-2016; pnm shot 2017; prevnar at age 47  -- ZOX:WRUEACCS:Never had a cscope  --prostate ca screening: n/a --Labs: Micro, BMP enthesis last potassium slightly high), CBC, TSH. --Diet and exercise discussed --+ Family history of CAD: Asymptomatic, EKG today NSR, no acute.  Plan is cardiovascular RF control.

## 2017-11-03 NOTE — Patient Instructions (Addendum)
GO TO THE LAB : Get the blood work     GO TO THE FRONT DESK Schedule your next appointment for a physical exam in 1 year  Your A1c goal is 6.5  Increase your physical activity if possible  GENERAL INFORMATION ABOUT HEALTHY EATING, CHOLESTEROL, HIGH BLOOD PRESSURE, diabetes: The American Heart Association, www.heart.org  Check the "Life's Simple 7" from the [facility] The American diabetes Association www.diabetes.org

## 2017-11-03 NOTE — Progress Notes (Signed)
Pre visit review using our clinic review tool, if applicable. No additional management support is needed unless otherwise documented below in the visit note. 

## 2017-11-03 NOTE — Progress Notes (Signed)
Subjective:    Patient ID: Shawn Walter, male    DOB: 03-02-1971, 47 y.o.   MRN: [id number]  DOS:  11/03/2017 Type of visit - description : CPX Interval history: No major concerns although he recently changed jobs, moved to a new house, not exercising regularly.   Review of Systems  Other than above, a 14 point review of systems is negative     Past Medical History:  Diagnosis Date  . Diabetes mellitus   . Hyperlipidemia   . Normal cardiac stress test 02-2011   . Renal cyst, right    Complex, Dr. Marlou Porch  . Urolithiasis 11-2013   L    Past Surgical History:  Procedure Laterality Date  . INCISE AND DRAIN ABCESS  07/11/11   perirectal abscess     Social History   Socioeconomic History  . Marital status: Married    Spouse name: Not on file  . Number of children: 2  . Years of education: Not on file  . Highest education level: Not on file  Occupational History  . Occupation: self employeed used to work for [employer]  . Financial resource strain: Not on file  . Food insecurity:    Worry: Not on file    Inability: Not on file  . Transportation needs:    Medical: Not on file    Non-medical: Not on file  Tobacco Use  . Smoking status: Former Smoker    Last attempt to quit: 05/13/1999    Years since quitting: 18.4  . Smokeless tobacco: Never Used  Substance and Sexual Activity  . Alcohol use: Yes    Comment: socially   . Drug use: No  . Sexual activity: Not on file  Lifestyle  . Physical activity:    Days per week: Not on file    Minutes per session: Not on file  . Stress: Not on file  Relationships  . Social connections:    Talks on phone: Not on file    Gets together: Not on file    Attends religious service: Not on file    Active member of club or organization: Not on file    Attends meetings of clubs or organizations: Not on file    Relationship status: Not on file  . Intimate partner violence:    Fear of current or ex partner: Not on file     Emotionally abused: Not on file    Physically abused: Not on file    Forced sexual activity: Not on file  Other Topics Concern  . Not on file  Social History Narrative   Married, born in [place]     children x 2  (daughter 2004, son 2010)      Family History  Problem Relation Age of Onset  . Breast cancer Mother   . Sudden death Brother 9       MI   . Hypertension Neg Hx   . Hyperlipidemia Neg Hx   . Heart attack Neg Hx   . Diabetes Neg Hx   . Colon cancer Neg Hx   . Prostate cancer Neg Hx      Allergies as of 11/03/2017   No Known Allergies     Medication List        Accurate as of 11/03/17 11:59 PM. Always use your most recent med list.          glucose blood test strip Check blood sugar no more than twice daily.  onetouch ultrasoft lancets Use as instructed   pioglitazone 15 MG tablet Commonly known as:  ACTOS TAKE 1 TABLET (15 MG TOTAL) BY MOUTH DAILY.   rosuvastatin 20 MG tablet Commonly known as:  CRESTOR Take 1 tablet (20 mg total) by mouth daily.   sitaGLIPtin-metformin 50-1000 MG tablet Commonly known as:  JANUMET Take 1 tablet by mouth 2 (two) times daily.          Objective:   Physical Exam BP 124/66 (BP Location: Left Arm, Patient Position: Sitting, Cuff Size: Small)   Pulse 78   Temp 98.5 F (36.9 C) (Oral)   Resp 16   Ht 5\' 5"  (1.651 m)   Wt 179 lb 6 oz (81.4 kg)   SpO2 98%   BMI 29.85 kg/m  General: Well developed, NAD, see BMI.  Neck: No  thyromegaly  HEENT:  Normocephalic . Face symmetric, atraumatic Lungs:  CTA B Normal respiratory effort, no intercostal retractions, no accessory muscle use. Heart: RRR,  no murmur.  No pretibial edema bilaterally  Abdomen:  Not distended, soft, non-tender. No rebound or rigidity. DIABETIC FEET EXAM: No lower extremity edema Normal pedal pulses bilaterally Skin normal, nails normal, no calluses Pinprick examination of the feet normal. Skin: Exposed areas without rash. Not pale.  Not jaundice Neurologic:  alert & oriented X3.  Speech normal, gait appropriate for age and unassisted Strength symmetric and appropriate for age.  Psych: Cognition and judgment appear intact.  Cooperative with normal attention span and concentration.  Behavior appropriate. No anxious or depressed appearing.     Assessment & Plan:    Assessment DM: Dr Lucianne MussKumar  Hyperlipidemia GU: --Urolithiasis  --L--  11-2013 --Bilateral Bosniak 1 renal cysts. --Pyelonephritis 09-2014 + FH CAD brother age 47.  Stress test (-) 2012  PLAN  DM: Per Endo, feet exam negative today.  Last A1c 8.3.  Risk of CAD discussed with patient, encouraged to work with Endo to improve A1C. Hyperlipidemia: On Crestor, LDL 93. Bilateral Bosniak 1 renal cysts: I reviewed available data, had a US at urology urology [phone]. I requested last urology visit, from 10/01/2015, they recommended an MRI, MRI from 10/2015 showing multiple bilateral Bosniak 1 and 2 renal cysts. RTC 1 year

## 2017-11-04 LAB — MICROALBUMIN / CREATININE URINE RATIO
CREATININE, U: 99 mg/dL
MICROALB UR: 1.6 mg/dL (ref 0.0–1.9)
Microalb Creat Ratio: 1.6 mg/g (ref 0.0–30.0)

## 2017-11-04 NOTE — Assessment & Plan Note (Signed)
DM: Per Endo, feet exam negative today.  Last A1c 8.3.  Risk of CAD discussed with patient, encouraged to work with Endo to improve A1C. Hyperlipidemia: On Crestor, LDL 93. Bilateral Bosniak 1 renal cysts: I reviewed available data, had a US at urology urology [phone]. I requested last urology visit, from 10/01/2015, they recommended an MRI, MRI from 10/2015 showing multiple bilateral Bosniak 1 and 2 renal cysts. RTC 1 year

## 2017-12-07 ENCOUNTER — Telehealth: Payer: Self-pay | Admitting: Endocrinology

## 2017-12-07 ENCOUNTER — Other Ambulatory Visit: Payer: Self-pay

## 2017-12-07 MED ORDER — GLUCOSE BLOOD VI STRP: ORAL_STRIP | 2 refills | Status: DC

## 2017-12-07 NOTE — Telephone Encounter (Signed)
Rx sent 

## 2017-12-07 NOTE — Telephone Encounter (Signed)
glucose blood test strip   Patient is needing prescription sent into the pharmacy     [facility] [place] - [address]

## 2017-12-17 ENCOUNTER — Other Ambulatory Visit: Payer: Self-pay | Admitting: Endocrinology

## 2017-12-17 MED FILL — PIOGLITAZONE HCL 15 MG TAB: 15 | 60 days supply | Qty: 60 | Fill #0

## 2017-12-17 MED FILL — ROSUVASTATIN CALCIUM 20 MG: 20 | 30 days supply | Qty: 30 | Fill #1

## 2018-01-29 MED FILL — ROSUVASTATIN CALCIUM 20 MG: 20 | 30 days supply | Qty: 30 | Fill #2

## 2018-02-01 ENCOUNTER — Other Ambulatory Visit: Payer: 59

## 2018-02-03 ENCOUNTER — Ambulatory Visit: Payer: 59 | Admitting: Endocrinology

## 2018-02-10 MED FILL — ROSUVASTATIN CALCIUM 20 MG: 20 | 30 days supply | Qty: 30 | Fill #2

## 2018-07-26 ENCOUNTER — Other Ambulatory Visit: Payer: Self-pay

## 2018-07-26 ENCOUNTER — Telehealth: Payer: Self-pay | Admitting: Endocrinology

## 2018-07-26 MED ORDER — SITAGLIPTIN PHOS-METFORMIN HCL 50-1000 MG PO TABS: 1.0000 | ORAL_TABLET | Freq: Two times a day (BID) | ORAL | 1 refills | Status: DC

## 2018-07-26 MED ORDER — PIOGLITAZONE HCL 15 MG PO TABS: 15.0000 mg | ORAL_TABLET | Freq: Every day | ORAL | 0 refills | Status: DC

## 2018-07-26 MED ORDER — ROSUVASTATIN CALCIUM 20 MG PO TABS: 20.0000 mg | ORAL_TABLET | Freq: Every day | ORAL | 0 refills | Status: DC

## 2018-07-26 NOTE — Telephone Encounter (Signed)
MEDICATION: sitaGLIPtin-metformin (JANUMET) 50-1000 MG tablet  pioglitazone (ACTOS) 15 MG tablet   rosuvastatin (CRESTOR) 20 MG tablet   PHARMACY:  [facility] - [place], [place] - [address]   IS THIS A 90 DAY SUPPLY :  Yes  IS PATIENT OUT OF MEDICATION:  yes  IF NOT; HOW MUCH IS LEFT:   LAST APPOINTMENT DATE: @Visit  date not found  NEXT APPOINTMENT DATE:@4 /27/2020  DO WE HAVE YOUR PERMISSION TO LEAVE A DETAILED MESSAGE:  OTHER COMMENTS:    **Let patient know to contact pharmacy at the end of the day to make sure medication is ready. **  ** Please notify patient to allow 48-72 hours to process**  **Encourage patient to contact the pharmacy for refills or they can request refills through [facility]**

## 2018-07-26 NOTE — Telephone Encounter (Signed)
Rx sent 

## 2018-07-27 MED FILL — PIOGLITAZONE HCL 15 MG TAB: 15 | 60 days supply | Qty: 60 | Fill #0

## 2018-07-27 MED FILL — JANUMET 50-1,000 MG TABLET: 50-1000 | 30 days supply | Qty: 60 | Fill #0 | Status: TO

## 2018-07-27 MED FILL — ROSUVASTATIN CALCIUM 20 MG: 20 | 90 days supply | Qty: 90 | Fill #0

## 2018-09-02 ENCOUNTER — Other Ambulatory Visit: Payer: Self-pay | Admitting: Endocrinology

## 2018-09-02 MED FILL — PIOGLITAZONE HCL 15 MG TAB: 15 | 60 days supply | Qty: 60 | Fill #0

## 2018-09-02 MED FILL — JANUMET 50-1,000 MG TABLET: 50-1000 | 30 days supply | Qty: 60 | Fill #0

## 2018-09-06 ENCOUNTER — Other Ambulatory Visit (INDEPENDENT_AMBULATORY_CARE_PROVIDER_SITE_OTHER): Payer: 59

## 2018-09-06 ENCOUNTER — Other Ambulatory Visit: Payer: Self-pay

## 2018-09-06 DIAGNOSIS — E1165 Type 2 diabetes mellitus with hyperglycemia: Secondary | ICD-10-CM

## 2018-09-06 LAB — BASIC METABOLIC PANEL
BUN: 15 mg/dL (ref 6–23)
CO2: 27 mEq/L (ref 19–32)
Calcium: 8.9 mg/dL (ref 8.4–10.5)
Chloride: 102 mEq/L (ref 96–112)
Creatinine, Ser: 0.91 mg/dL (ref 0.40–1.50)
GFR: 89.05 mL/min (ref >60.00)
Glucose, Bld: 165 mg/dL — ABNORMAL HIGH (ref 70–99)
Potassium: 4.8 mEq/L (ref 3.5–5.1)
Sodium: 136 mEq/L (ref 135–145)

## 2018-09-06 LAB — MICROALBUMIN / CREATININE URINE RATIO
Creatinine,U: 151 mg/dL
Microalb Creat Ratio: 3.7 mg/g (ref 0.0–30.0)
Microalb, Ur: 5.5 mg/dL — ABNORMAL HIGH (ref 0.0–1.9)

## 2018-09-06 LAB — HEMOGLOBIN A1C: Hgb A1c MFr Bld: 9.1 % — ABNORMAL HIGH (ref 4.6–6.5)

## 2018-09-08 ENCOUNTER — Other Ambulatory Visit: Payer: Self-pay

## 2018-09-08 ENCOUNTER — Ambulatory Visit (INDEPENDENT_AMBULATORY_CARE_PROVIDER_SITE_OTHER): Payer: 59 | Admitting: Endocrinology

## 2018-09-08 ENCOUNTER — Encounter: Payer: Self-pay | Admitting: Endocrinology

## 2018-09-08 DIAGNOSIS — E785 Hyperlipidemia, unspecified: Secondary | ICD-10-CM | POA: Diagnosis not present

## 2018-09-08 DIAGNOSIS — E1165 Type 2 diabetes mellitus with hyperglycemia: Secondary | ICD-10-CM | POA: Diagnosis not present

## 2018-09-08 MED ORDER — GLUCOSE BLOOD VI STRP: 1.0000 | ORAL_STRIP | Freq: Every day | 3 refills | Status: DC

## 2018-09-08 MED ORDER — FREESTYLE FREEDOM LITE W/DEVICE KIT: PACK | 0 refills | Status: DC

## 2018-09-08 MED ORDER — DAPAGLIFLOZIN PROPANEDIOL 5 MG PO TABS: 5.0000 mg | ORAL_TABLET | Freq: Every day | ORAL | 3 refills | Status: DC

## 2018-09-08 MED ORDER — ACCU-CHEK FASTCLIX LANCET KIT: PACK | 0 refills | Status: DC

## 2018-09-08 MED ORDER — ACCU-CHEK FASTCLIX LANCETS MISC: 3 refills | Status: DC

## 2018-09-08 MED FILL — FARXIGA 5 MG TABLET: 5 | 30 days supply | Qty: 30 | Fill #0

## 2018-09-08 MED FILL — FREESTYLE LITE METER: 1 days supply | Qty: 1 | Fill #0

## 2018-09-08 MED FILL — FREESTYLE LANCETS: 90 days supply | Qty: 102 | Fill #0

## 2018-09-08 MED FILL — FREESTYLE LITE TEST STRIP: 90 days supply | Qty: 100 | Fill #0

## 2018-09-08 NOTE — Progress Notes (Signed)
Shawn Walter 48 y.o.            Today's office visit was provided via telemedicine using video technique Explained to the patient and the the limitations of evaluation and management by telemedicine and the availability of in person appointments.  The patient understood the limitations and agreed to proceed. Patient also understood that the telehealth visit is billable. . Location of the patient: Home . Location of the provider: Office Only the patient and myself were participating in the encounter   Reason for Appointment : Followup for Type 2 Diabetes  History of Present Illness          Diagnosis: Type 2 diabetes mellitus, date of diagnosis: 2004        Past history: His glucose was about 220 at diagnosis. He thinks he was not treated with medications but told to modify his diet and start exercise. Not clear how his diabetes was controlled with this regimen He thinks about 3 years ago he was started on metformin, probably when his A1c was 8.2%. Blood sugar was somewhat better but his A1c at best was 7.1% Although he had some initial improvement with metformin initially his blood sugars have been generally higher this year especially in 1/14 At some point he was also given Amaryl 4 mg but this was stopped when he was having low blood sugars Prior to his initial consultation he had been totally noncompliant with diet, exercise and glucose monitoring. He  probably gained weight also. He was on Janumet and Actos but his glucose on the lab was 348 along with A1c of 9.5  Recent history:   Treatment regimen: Janumet 50/1000 twice a day and Actos 15 mg daily  Has not been seen in follow-up since 6/19  His A1c was down to 6.4 previously but now back up to 9.1  Current problems, management and blood sugar levels:  He is again totally noncompliant with his follow-up instructions, checking his blood sugars and watching diet and exercise  He has had persistently poor control and A1c  is the highest he has had  Despite discussion on needing to change her medications with more effective drugs and improved lifestyle he still has been reluctant to do anything different  He says he was following a low carbohydrate/keto diet for about a month or so and he thinks his blood sugars are between 90 and 140 when he was doing this  However his highest blood sugar otherwise has been only 189, not clear how often he checks  Fasting lab glucose was 165  He currently does not have tested for his glucose monitor and has not tested for couple of months  He says partly because of his changing work schedule he has not been exercising  Also has not been motivated to watch his carbohydrate intake  Not clear if he has had any weight change recently    Glucose monitoring:  less than once a day       Glucometer: None    Blood Glucose readings: As above, blood sugar average was about 160 a couple of months ago when he was monitoring           Self-care:     Physical activity: exercise: None, was on treadmill 30 minutes, 5 days a week         Dietician visit: Most recent: At the time of diagnosis  Wt Readings from Last 3 Encounters:  11/03/17 179 lb 6 oz (81.4 kg)  11/02/17 181 lb 6.4 oz (82.3 kg)  10/07/16 177 lb 4 oz (80.4 kg)    Lab Results  Component Value Date   HGBA1C 9.1 (H) 09/06/2018   HGBA1C 8.3 (H) 10/22/2017   HGBA1C 6.4 09/19/2016   Lab Results  Component Value Date   MICROALBUR 5.5 (H) 09/06/2018   LDLCALC 93 10/22/2017   CREATININE 0.91 09/06/2018   Lab on 09/06/2018  Component Date Value Ref Range Status  . Microalb, Ur 09/06/2018 5.5* 0.0 - 1.9 mg/dL Final  . Creatinine,U 40/98/119104/27/2020 151.0  mg/dL Final  . Microalb Creat Ratio 09/06/2018 3.7  0.0 - 30.0 mg/g Final  . Sodium 09/06/2018 136  135 - 145 mEq/L Final  . Potassium 09/06/2018 4.8  3.5 - 5.1 mEq/L Final  . Chloride 09/06/2018 102  96 - 112 mEq/L Final  . CO2 09/06/2018 27  19 -  32 mEq/L Final  . Glucose, Bld 09/06/2018 165* 70 - 99 mg/dL Final  . BUN 47/82/956204/27/2020 15  6 - 23 mg/dL Final  . Creatinine, Ser 09/06/2018 0.91  0.40 - 1.50 mg/dL Final  . Calcium 13/08/657804/27/2020 8.9  8.4 - 10.5 mg/dL Final  . GFR 46/96/295204/27/2020 89.05  >60.00 mL/min Final  . Hgb A1c MFr Bld 09/06/2018 9.1* 4.6 - 6.5 % Final   Glycemic Control Guidelines for People with Diabetes:Non Diabetic:  <6%Goal of Therapy: <7%Additional Action Suggested:  >8%      Allergies as of 09/08/2018   No Known Allergies     Medication List       Accurate as of September 08, 2018  9:46 AM. Always use your most recent med list.        glucose blood test strip Check blood sugar no more than twice daily.   onetouch ultrasoft lancets Use as instructed   pioglitazone 15 MG tablet Commonly known as:  ACTOS TAKE 1 TABLET (15 MG TOTAL) BY MOUTH DAILY.   rosuvastatin 20 MG tablet Commonly known as:  CRESTOR Take 1 tablet (20 mg total) by mouth daily.   sitaGLIPtin-metformin 50-1000 MG tablet Commonly known as:  Janumet Take 1 tablet by mouth 2 (two) times daily.         Allergies: No Known Allergies  Past Medical History:  Diagnosis Date  . Diabetes mellitus   . Hyperlipidemia   . Normal cardiac stress test 02-2011   . Renal cyst, right    Complex, Dr. Marlou PorchHerrick  . Urolithiasis 11-2013   L    Past Surgical History:  Procedure Laterality Date  . INCISE AND DRAIN ABCESS  07/11/11   perirectal abscess     Family History  Problem Relation Age of Onset  . Breast cancer Mother   . Sudden death Brother [id number]       MI   . Hypertension Neg Hx   . Hyperlipidemia Neg Hx   . Heart attack Neg Hx   . Diabetes Neg Hx   . Colon cancer Neg Hx   . Prostate cancer Neg Hx     Social History:  reports that he quit smoking about 19 years ago. He has never used smokeless tobacco. He reports current alcohol use. He reports that he does not use drugs.    Review of Systems   No history of hypertension        Lipids: He has been treated with Crestor by PCP, needs follow-up labs  Positive history of  sudden death in his family with his brother dying at the age of 40     Lab Results  Component Value Date   CHOL 163 10/22/2017   CHOL 184 10/07/2016   CHOL 190 09/04/2015   Lab Results  Component Value Date   HDL 48.30 10/22/2017   HDL 49.30 10/07/2016   HDL 44.20 09/04/2015   Lab Results  Component Value Date   LDLCALC 93 10/22/2017   LDLCALC 114 (H) 10/07/2016   LDLCALC 117 (H) 09/04/2015   Lab Results  Component Value Date   TRIG 109.0 10/22/2017   TRIG 107.0 10/07/2016   TRIG 146.0 09/04/2015   Lab Results  Component Value Date   CHOLHDL 3 10/22/2017   CHOLHDL 4 10/07/2016   CHOLHDL 4 09/04/2015   Lab Results  Component Value Date   LDLDIRECT 178.1 04/20/2013   LDLDIRECT 173.6 05/14/2012   LDLDIRECT 119.7 02/27/2011      LABS:  Lab on 09/06/2018  Component Date Value Ref Range Status  . Microalb, Ur 09/06/2018 5.5* 0.0 - 1.9 mg/dL Final  . Creatinine,U 16/02/9603 151.0  mg/dL Final  . Microalb Creat Ratio 09/06/2018 3.7  0.0 - 30.0 mg/g Final  . Sodium 09/06/2018 136  135 - 145 mEq/L Final  . Potassium 09/06/2018 4.8  3.5 - 5.1 mEq/L Final  . Chloride 09/06/2018 102  96 - 112 mEq/L Final  . CO2 09/06/2018 27  19 - 32 mEq/L Final  . Glucose, Bld 09/06/2018 165* 70 - 99 mg/dL Final  . BUN 54/01/8118 15  6 - 23 mg/dL Final  . Creatinine, Ser 09/06/2018 0.91  0.40 - 1.50 mg/dL Final  . Calcium 14/78/2956 8.9  8.4 - 10.5 mg/dL Final  . GFR 21/30/8657 89.05  >60.00 mL/min Final  . Hgb A1c MFr Bld 09/06/2018 9.1* 4.6 - 6.5 % Final   Glycemic Control Guidelines for People with Diabetes:Non Diabetic:  <6%Goal of Therapy: <7%Additional Action Suggested:  >8%     Physical Examination:  There were no vitals taken for this visit.     ASSESSMENT:  Diabetes type 2 with mild obesity  See history of present illness for discussion of current diabetes management,  blood sugar patterns and problems identified  His A1c is at the highest level of 9.1 since 2017  He is returning now for follow-up after a year He does not keep his appointments as directed Also has minimal glucose monitoring lately He does not understand the implications of persistent poor control and that his A1c is progressively getting higher now  Again discussed in detail the progression of diabetes, need for more aggressive management not only with lifestyle and exercise but also with more effective medication He has again wanted to not change his medications and try to improve his diet and exercise regimen again Discussed that he cannot follow a minimal carbohydrate diet that he was doing previously for long-term and needs to be consistent and on lifestyle changes  Discussed action of SGLT 2 drugs on lowering glucose by decreasing kidney absorption of glucose, benefits of weight loss and lower blood pressure, possible side effects, need for increased fluid intake with this and dosage regimen  His insurance appears to be covering [place] and he can start with 5 mg daily He can get the co-pay card online Sent information on the brochure by a MyChart message  Most likely if this is effective on the next visit we can switch from Janumet to [place] and also consider increasing the dose He  will continue his Janumet and Actos unchanged Discussed blood sugar targets after meals  Needs to have regular follow-up for continued care and refills on prescriptions Also needs regular eye exams and foot exams   HYPERLIPIDEMIA: To continue Crestor, needs to take this regularly and check labs on the next visit  Counseling time on subjects discussed in assessment and plan sections is over 50% of today's 25 minute visit    There are no Patient Instructions on file for this visit.    Reather Littler 09/08/2018, 9:46 AM

## 2018-10-05 ENCOUNTER — Other Ambulatory Visit: Payer: Self-pay

## 2018-10-05 MED ORDER — PIOGLITAZONE HCL 15 MG PO TABS: 15.0000 mg | ORAL_TABLET | Freq: Every day | ORAL | 0 refills | Status: DC

## 2018-10-05 MED FILL — JANUMET 50-1,000 MG TABLET: 50-1000 | 30 days supply | Qty: 60 | Fill #1

## 2018-10-15 ENCOUNTER — Other Ambulatory Visit: Payer: Self-pay

## 2018-10-15 ENCOUNTER — Other Ambulatory Visit (INDEPENDENT_AMBULATORY_CARE_PROVIDER_SITE_OTHER): Payer: 59

## 2018-10-15 DIAGNOSIS — E1165 Type 2 diabetes mellitus with hyperglycemia: Secondary | ICD-10-CM

## 2018-10-15 DIAGNOSIS — E785 Hyperlipidemia, unspecified: Secondary | ICD-10-CM

## 2018-10-15 LAB — LIPID PANEL
Cholesterol: 136 mg/dL (ref 0–200)
HDL: 47.9 mg/dL (ref >39.00)
LDL Cholesterol: 73 mg/dL (ref 0–99)
NonHDL: 88.39
Total CHOL/HDL Ratio: 3
Triglycerides: 77 mg/dL (ref 0.0–149.0)
VLDL: 15.4 mg/dL (ref 0.0–40.0)

## 2018-10-15 LAB — COMPREHENSIVE METABOLIC PANEL
ALT: 24 U/L (ref 0–53)
AST: 24 U/L (ref 0–37)
Albumin: 4.1 g/dL (ref 3.5–5.2)
Alkaline Phosphatase: 60 U/L (ref 39–117)
BUN: 10 mg/dL (ref 6–23)
CO2: 27 mEq/L (ref 19–32)
Calcium: 8.8 mg/dL (ref 8.4–10.5)
Chloride: 102 mEq/L (ref 96–112)
Creatinine, Ser: 0.93 mg/dL (ref 0.40–1.50)
GFR: 86.8 mL/min (ref >60.00)
Glucose, Bld: 129 mg/dL — ABNORMAL HIGH (ref 70–99)
Potassium: 4.7 mEq/L (ref 3.5–5.1)
Sodium: 136 mEq/L (ref 135–145)
Total Bilirubin: 0.6 mg/dL (ref 0.2–1.2)
Total Protein: 7.5 g/dL (ref 6.0–8.3)

## 2018-10-15 LAB — HEMOGLOBIN A1C: Hgb A1c MFr Bld: 8.1 % — ABNORMAL HIGH (ref 4.6–6.5)

## 2018-10-19 ENCOUNTER — Ambulatory Visit (INDEPENDENT_AMBULATORY_CARE_PROVIDER_SITE_OTHER): Payer: 59 | Admitting: Endocrinology

## 2018-10-19 ENCOUNTER — Encounter: Payer: Self-pay | Admitting: Endocrinology

## 2018-10-19 ENCOUNTER — Other Ambulatory Visit: Payer: Self-pay

## 2018-10-19 VITALS — Ht 65.0 in

## 2018-10-19 DIAGNOSIS — E1165 Type 2 diabetes mellitus with hyperglycemia: Secondary | ICD-10-CM | POA: Diagnosis not present

## 2018-10-19 DIAGNOSIS — E785 Hyperlipidemia, unspecified: Secondary | ICD-10-CM | POA: Diagnosis not present

## 2018-10-19 MED ORDER — DAPAGLIFLOZIN PROPANEDIOL 10 MG PO TABS: 10.0000 mg | ORAL_TABLET | Freq: Every day | ORAL | 2 refills | Status: DC

## 2018-10-19 MED FILL — FARXIGA 10 MG TABLET: 10 | 30 days supply | Qty: 30 | Fill #0

## 2018-10-19 NOTE — Progress Notes (Signed)
Shawn Walter 48 y.o.            Today's office visit was provided via telemedicine using video technique Explained to the patient and the the limitations of evaluation and management by telemedicine and the availability of in person appointments.  The patient understood the limitations and agreed to proceed. Patient also understood that the telehealth visit is billable. . Location of the patient: Home . Location of the provider: Office Only the patient and myself were participating in the encounter   Reason for Appointment : Followup for Type 2 Diabetes  History of Present Illness          Diagnosis: Type 2 diabetes mellitus, date of diagnosis: 2004        Past history: His glucose was about 220 at diagnosis. He thinks he was not treated with medications but told to modify his diet and start exercise. Not clear how his diabetes was controlled with this regimen He thinks about 3 years ago he was started on metformin, probably when his A1c was 8.2%. Blood sugar was somewhat better but his A1c at best was 7.1% Although he had some initial improvement with metformin initially his blood sugars have been generally higher this year especially in 1/14 At some point he was also given Amaryl 4 mg but this was stopped when he was having low blood sugars Prior to his initial consultation he had been totally noncompliant with diet, exercise and glucose monitoring. He  probably gained weight also. He was on Janumet and Actos but his glucose on the lab was 348 along with A1c of 9.5  Recent history:   Treatment regimen: Janumet 50/1000 twice a day and Actos 15 mg daily, Farxiga 5 mg daily  His last A1c was 9.1 and now 8.1  Current problems, management and blood sugar levels:  He is not regular with his Wilder Glade as prescribed about 6 weeks ago  When he first started the medication he apparently had an episode of abdominal pain, nausea and vomiting and he thought it was related to the  medication and he stopped taking it  He started back on this 3 weeks ago only  Glucose monitoring: He has been again quite irregular with this and has only some readings in the mornings  FASTING blood sugars range 127-164  Forgets to do readings after supper  Unable to compare his weight but he thinks that it is about the same at 170  He has started doing some exercise on the treadmill although not consistently  Also he feels that he is trying to plan his meals a little better with cutting back carbohydrates    Glucose monitoring:  less than once a day       Glucometer: Accu-Chek    Blood Glucose readings: As above,  Dietician visit: Most recent: At the time of diagnosis                  Wt Readings from Last 3 Encounters:  11/03/17 179 lb 6 oz (81.4 kg)  11/02/17 181 lb 6.4 oz (82.3 kg)  10/07/16 177 lb 4 oz (80.4 kg)    Lab Results  Component Value Date   HGBA1C 8.1 (H) 10/15/2018   HGBA1C 9.1 (H) 09/06/2018   HGBA1C 8.3 (H) 10/22/2017   Lab Results  Component Value Date   MICROALBUR 5.5 (H) 09/06/2018   LDLCALC 73 10/15/2018   CREATININE 0.93 10/15/2018   Lab on 10/15/2018  Component Date Value Ref Range Status  .  Cholesterol 10/15/2018 136  0 - 200 mg/dL Final   ATP III Classification       Desirable:  < 200 mg/dL               Borderline High:  200 - 239 mg/dL          High:  > = 240 mg/dL  . Triglycerides 10/15/2018 77.0  0.0 - 149.0 mg/dL Final   Normal:  <150 mg/dLBorderline High:  150 - 199 mg/dL  . HDL 10/15/2018 47.90  >39.00 mg/dL Final  . VLDL 10/15/2018 15.4  0.0 - 40.0 mg/dL Final  . LDL Cholesterol 10/15/2018 73  0 - 99 mg/dL Final  . Total CHOL/HDL Ratio 10/15/2018 3   Final                  Men          Women1/2 Average Risk     3.4          3.3Average Risk          5.0          4.42X Average Risk          9.6          7.13X Average Risk          15.0          11.0                      . NonHDL 10/15/2018 88.39   Final   NOTE:  Non-HDL goal  should be 30 mg/dL higher than patient's LDL goal (i.e. LDL goal of < 70 mg/dL, would have non-HDL goal of < 100 mg/dL)  . Sodium 10/15/2018 136  135 - 145 mEq/L Final  . Potassium 10/15/2018 4.7  3.5 - 5.1 mEq/L Final  . Chloride 10/15/2018 102  96 - 112 mEq/L Final  . CO2 10/15/2018 27  19 - 32 mEq/L Final  . Glucose, Bld 10/15/2018 129* 70 - 99 mg/dL Final  . BUN 10/15/2018 10  6 - 23 mg/dL Final  . Creatinine, Ser 10/15/2018 0.93  0.40 - 1.50 mg/dL Final  . Total Bilirubin 10/15/2018 0.6  0.2 - 1.2 mg/dL Final  . Alkaline Phosphatase 10/15/2018 60  39 - 117 U/L Final  . AST 10/15/2018 24  0 - 37 U/L Final  . ALT 10/15/2018 24  0 - 53 U/L Final  . Total Protein 10/15/2018 7.5  6.0 - 8.3 g/dL Final  . Albumin 10/15/2018 4.1  3.5 - 5.2 g/dL Final  . Calcium 10/15/2018 8.8  8.4 - 10.5 mg/dL Final  . GFR 10/15/2018 86.80  >60.00 mL/min Final  . Hgb A1c MFr Bld 10/15/2018 8.1* 4.6 - 6.5 % Final   Glycemic Control Guidelines for People with Diabetes:Non Diabetic:  <6%Goal of Therapy: <7%Additional Action Suggested:  >8%      Allergies as of 10/19/2018   No Known Allergies     Medication List       Accurate as of October 19, 2018  9:06 AM. If you have any questions, ask your nurse or doctor.        Accu-Chek [employer] Kit Use as instructed to check blood sugar daily.   Accu-Chek FastClix Lancets Misc Use as instructed to check blood sugar daily.   dapagliflozin propanediol 5 MG Tabs tablet Commonly known as:  Farxiga Take 5 mg by mouth daily.   FreeStyle Freedom Lite w/Device Kit Use as  instructed to check blood sugar once daily.   glucose blood test strip Commonly known as:  FREESTYLE LITE 1 each by Other route daily. Use freestyle lite test strip as instructed to check blood sugar once daily.   pioglitazone 15 MG tablet Commonly known as:  ACTOS Take 1 tablet (15 mg total) by mouth daily.   rosuvastatin 20 MG tablet Commonly known as:  CRESTOR Take 1 tablet (20 mg  total) by mouth daily.   sitaGLIPtin-metformin 50-1000 MG tablet Commonly known as:  Janumet Take 1 tablet by mouth 2 (two) times daily.         Allergies: No Known Allergies  Past Medical History:  Diagnosis Date  . Diabetes mellitus   . Hyperlipidemia   . Normal cardiac stress test 02-2011   . Renal cyst, right    Complex, Dr. Louis Meckel  . Urolithiasis 11-2013   L    Past Surgical History:  Procedure Laterality Date  . INCISE AND DRAIN ABCESS  07/11/11   perirectal abscess     Family History  Problem Relation Age of Onset  . Breast cancer Mother   . Sudden death Brother 74       MI   . Hypertension Neg Hx   . Hyperlipidemia Neg Hx   . Heart attack Neg Hx   . Diabetes Neg Hx   . Colon cancer Neg Hx   . Prostate cancer Neg Hx     Social History:  reports that he quit smoking about 19 years ago. He has never used smokeless tobacco. He reports current alcohol use. He reports that he does not use drugs.    Review of Systems   No history of hypertension       Lipids: He has been treated with Crestor 20 mg by PCP Previous LDL as high as 114 Recently it is down to 73 and he thinks he is taking his rosuvastatin regularly  Positive history of sudden death in his family with his brother dying at the age of 67     Lab Results  Component Value Date   CHOL 136 10/15/2018   CHOL 163 10/22/2017   CHOL 184 10/07/2016   Lab Results  Component Value Date   HDL 47.90 10/15/2018   HDL 48.30 10/22/2017   HDL 49.30 10/07/2016   Lab Results  Component Value Date   LDLCALC 73 10/15/2018   LDLCALC 93 10/22/2017   LDLCALC 114 (H) 10/07/2016   Lab Results  Component Value Date   TRIG 77.0 10/15/2018   TRIG 109.0 10/22/2017   TRIG 107.0 10/07/2016   Lab Results  Component Value Date   CHOLHDL 3 10/15/2018   CHOLHDL 3 10/22/2017   CHOLHDL 4 10/07/2016   Lab Results  Component Value Date   LDLDIRECT 178.1 04/20/2013   LDLDIRECT 173.6 05/14/2012   LDLDIRECT  119.7 02/27/2011      LABS:  Lab on 10/15/2018  Component Date Value Ref Range Status  . Cholesterol 10/15/2018 136  0 - 200 mg/dL Final   ATP III Classification       Desirable:  < 200 mg/dL               Borderline High:  200 - 239 mg/dL          High:  > = 240 mg/dL  . Triglycerides 10/15/2018 77.0  0.0 - 149.0 mg/dL Final   Normal:  <150 mg/dLBorderline High:  150 - 199 mg/dL  . HDL 10/15/2018 47.90  >39.00  mg/dL Final  . VLDL 10/15/2018 15.4  0.0 - 40.0 mg/dL Final  . LDL Cholesterol 10/15/2018 73  0 - 99 mg/dL Final  . Total CHOL/HDL Ratio 10/15/2018 3   Final                  Men          Women1/2 Average Risk     3.4          3.3Average Risk          5.0          4.42X Average Risk          9.6          7.13X Average Risk          15.0          11.0                      . NonHDL 10/15/2018 88.39   Final   NOTE:  Non-HDL goal should be 30 mg/dL higher than patient's LDL goal (i.e. LDL goal of < 70 mg/dL, would have non-HDL goal of < 100 mg/dL)  . Sodium 10/15/2018 136  135 - 145 mEq/L Final  . Potassium 10/15/2018 4.7  3.5 - 5.1 mEq/L Final  . Chloride 10/15/2018 102  96 - 112 mEq/L Final  . CO2 10/15/2018 27  19 - 32 mEq/L Final  . Glucose, Bld 10/15/2018 129* 70 - 99 mg/dL Final  . BUN 10/15/2018 10  6 - 23 mg/dL Final  . Creatinine, Ser 10/15/2018 0.93  0.40 - 1.50 mg/dL Final  . Total Bilirubin 10/15/2018 0.6  0.2 - 1.2 mg/dL Final  . Alkaline Phosphatase 10/15/2018 60  39 - 117 U/L Final  . AST 10/15/2018 24  0 - 37 U/L Final  . ALT 10/15/2018 24  0 - 53 U/L Final  . Total Protein 10/15/2018 7.5  6.0 - 8.3 g/dL Final  . Albumin 10/15/2018 4.1  3.5 - 5.2 g/dL Final  . Calcium 10/15/2018 8.8  8.4 - 10.5 mg/dL Final  . GFR 10/15/2018 86.80  >60.00 mL/min Final  . Hgb A1c MFr Bld 10/15/2018 8.1* 4.6 - 6.5 % Final   Glycemic Control Guidelines for People with Diabetes:Non Diabetic:  <6%Goal of Therapy: <7%Additional Action Suggested:  >8%     Physical Examination:  Ht  _0  (1.651 m)   BMI 29.85 kg/m      ASSESSMENT:  Diabetes type 2 with mild obesity  See history of present illness for discussion of current diabetes management, blood sugar patterns and problems identified  His A1c is 8.1  His A1c was however checked only about 6 weeks after his previous test  Not clear if adding Wilder Glade has completely benefited him since his fasting blood sugars are still averaging about 140-150 and only slightly better He likely has postprandial hyperglycemia which he does not monitor  Discussed needing to do the following changes  Start checking blood sugars more regularly and alternate fasting and after meal readings about 2 hours later  Discussed blood sugar targets of at least under 180 after meals  Consistent diet and exercise  Farxiga to be increased to 10 mg  Consider switching him to [place] and stopping Janumet on the next visit  HYPERLIPIDEMIA: LDL excellent He will continue 20 mg of Crestor  Follow-up in 2 months    There are no Patient Instructions on file for this visit.  Elayne Snare 10/19/2018, 9:06 AM

## 2018-10-28 ENCOUNTER — Other Ambulatory Visit: Payer: Self-pay

## 2018-10-28 MED ORDER — ROSUVASTATIN CALCIUM 20 MG PO TABS: 20.0000 mg | ORAL_TABLET | Freq: Every day | ORAL | 0 refills | Status: DC

## 2018-11-03 ENCOUNTER — Other Ambulatory Visit: Payer: Self-pay | Admitting: Endocrinology

## 2018-11-08 MED FILL — JANUMET 50-1,000 MG TABLET: 50-1000 | 60 days supply | Qty: 120 | Fill #2

## 2018-11-09 MED FILL — ROSUVASTATIN CALCIUM 20 MG: 20 | 90 days supply | Qty: 90 | Fill #0

## 2018-11-09 MED FILL — PIOGLITAZONE HCL 15 MG TAB: 15 | 60 days supply | Qty: 60 | Fill #0

## 2018-11-24 MED FILL — FARXIGA 10 MG TABLET: 10 | 30 days supply | Qty: 30 | Fill #0

## 2018-12-13 ENCOUNTER — Other Ambulatory Visit (INDEPENDENT_AMBULATORY_CARE_PROVIDER_SITE_OTHER): Payer: 59

## 2018-12-13 ENCOUNTER — Other Ambulatory Visit: Payer: Self-pay

## 2018-12-13 DIAGNOSIS — E1165 Type 2 diabetes mellitus with hyperglycemia: Secondary | ICD-10-CM | POA: Diagnosis not present

## 2018-12-13 LAB — BASIC METABOLIC PANEL
BUN: 14 mg/dL (ref 6–23)
CO2: 25 mEq/L (ref 19–32)
Calcium: 9.6 mg/dL (ref 8.4–10.5)
Chloride: 103 mEq/L (ref 96–112)
Creatinine, Ser: 0.87 mg/dL (ref 0.40–1.50)
GFR: 93.68 mL/min (ref >60.00)
Glucose, Bld: 124 mg/dL — ABNORMAL HIGH (ref 70–99)
Potassium: 4.9 mEq/L (ref 3.5–5.1)
Sodium: 139 mEq/L (ref 135–145)

## 2018-12-14 LAB — FRUCTOSAMINE: Fructosamine: 302 umol/L — ABNORMAL HIGH (ref 0–285)

## 2018-12-15 ENCOUNTER — Encounter: Payer: Self-pay | Admitting: Endocrinology

## 2018-12-15 ENCOUNTER — Ambulatory Visit (INDEPENDENT_AMBULATORY_CARE_PROVIDER_SITE_OTHER): Payer: 59 | Admitting: Endocrinology

## 2018-12-15 ENCOUNTER — Other Ambulatory Visit: Payer: Self-pay

## 2018-12-15 ENCOUNTER — Encounter: Payer: Self-pay | Admitting: Internal Medicine

## 2018-12-15 DIAGNOSIS — E1165 Type 2 diabetes mellitus with hyperglycemia: Secondary | ICD-10-CM | POA: Diagnosis not present

## 2018-12-15 NOTE — Progress Notes (Signed)
Decker K Fambro 48 y.o.            Today's office visit was provided via telemedicine using video technique Explained to the patient and the the limitations of evaluation and management by telemedicine and the availability of in person appointments.  The patient understood the limitations and agreed to proceed. Patient also understood that the telehealth visit is billable. . Location of the patient: Home . Location of the provider: Office Only the patient and myself were participating in the encounter   Reason for Appointment : Followup for Type 2 Diabetes  History of Present Illness          Diagnosis: Type 2 diabetes mellitus, date of diagnosis: 2004        Past history: His glucose was about 220 at diagnosis. He thinks he was not treated with medications but told to modify his diet and start exercise. Not clear how his diabetes was controlled with this regimen He thinks about 3 years ago he was started on metformin, probably when his A1c was 8.2%. Blood sugar was somewhat better but his A1c at best was 7.1% Although he had some initial improvement with metformin initially his blood sugars have been generally higher this year especially in 1/14 At some point he was also given Amaryl 4 mg but this was stopped when he was having low blood sugars Prior to his initial consultation he had been totally noncompliant with diet, exercise and glucose monitoring. He  probably gained weight also. He was on Janumet and Actos but his glucose on the lab was 348 along with A1c of 9.5  Recent history:   Treatment regimen: Janumet 50/1000 twice a day and Actos 15 mg daily, Farxiga 11m daily  His last A1c was 8.1 Fructosamine is 302  Current problems, management and blood sugar levels:  He is now taking 10 mg of Farxiga  However even though he was supposed to take it in the morning he is taking it at bedtime  He thinks that it causes a little bloating and abdominal discomfort  His  fructosamine indicates relatively better readings compared to his A1c before  However he has not done many readings after meals  Although fasting readings are still relatively high they are generally better than on his last visit  Lab fasting glucose was 124  He is trying to walk up to an hour about 3 days a week or so  However he thinks he can cut back on carbohydrates like rice  FASTING blood sugars range 121-139  After lunch: 175, before dinner 156  His weight is 175, probably a little better    Glucose monitoring:  less than once a day       Glucometer: Accu-Chek    Blood Glucose readings: As above,  Dietician visit: Most recent: At the time of diagnosis                  Wt Readings from Last 3 Encounters:  11/03/17 179 lb 6 oz (81.4 kg)  11/02/17 181 lb 6.4 oz (82.3 kg)  10/07/16 177 lb 4 oz (80.4 kg)    Lab Results  Component Value Date   HGBA1C 8.1 (H) 10/15/2018   HGBA1C 9.1 (H) 09/06/2018   HGBA1C 8.3 (H) 10/22/2017   Lab Results  Component Value Date   MICROALBUR 5.5 (H) 09/06/2018   LDLCALC 73 10/15/2018   CREATININE 0.87 12/13/2018   Lab on 12/13/2018  Component Date Value Ref Range Status  .  Sodium 12/13/2018 139  135 - 145 mEq/L Final  . Potassium 12/13/2018 4.9  3.5 - 5.1 mEq/L Final  . Chloride 12/13/2018 103  96 - 112 mEq/L Final  . CO2 12/13/2018 25  19 - 32 mEq/L Final  . Glucose, Bld 12/13/2018 124* 70 - 99 mg/dL Final  . BUN 12/13/2018 14  6 - 23 mg/dL Final  . Creatinine, Ser 12/13/2018 0.87  0.40 - 1.50 mg/dL Final  . Calcium 12/13/2018 9.6  8.4 - 10.5 mg/dL Final  . GFR 12/13/2018 93.68  >60.00 mL/min Final  . Fructosamine 12/13/2018 302* 0 - 285 umol/L Final   Comment: Published reference interval for apparently healthy subjects between age 54 and 103 is 50 - 285 umol/L and in a poorly controlled diabetic population is 228 - 563 umol/L with a mean of 396 umol/L.      Allergies as of 12/15/2018   No Known Allergies      Medication List       Accurate as of December 15, 2018 10:33 AM. If you have any questions, ask your nurse or doctor.        Accu-Chek [employer] Kit Use as instructed to check blood sugar daily.   Accu-Chek FastClix Lancets Misc Use as instructed to check blood sugar daily.   dapagliflozin propanediol 10 MG Tabs tablet Commonly known as: Farxiga Take 10 mg by mouth daily.   FreeStyle Freedom Lite w/Device Kit Use as instructed to check blood sugar once daily.   glucose blood test strip Commonly known as: FREESTYLE LITE 1 each by Other route daily. Use freestyle lite test strip as instructed to check blood sugar once daily.   pioglitazone 15 MG tablet Commonly known as: ACTOS Take 1 tablet (15 mg total) by mouth daily.   rosuvastatin 20 MG tablet Commonly known as: CRESTOR TAKE 1 TABLET (20 MG TOTAL) BY MOUTH DAILY.   sitaGLIPtin-metformin 50-1000 MG tablet Commonly known as: Janumet Take 1 tablet by mouth 2 (two) times daily.         Allergies: No Known Allergies  Past Medical History:  Diagnosis Date  . Diabetes mellitus   . Hyperlipidemia   . Normal cardiac stress test 02-2011   . Renal cyst, right    Complex, Dr. Louis Meckel  . Urolithiasis 11-2013   L    Past Surgical History:  Procedure Laterality Date  . INCISE AND DRAIN ABCESS  07/11/11   perirectal abscess     Family History  Problem Relation Age of Onset  . Breast cancer Mother   . Sudden death Brother 28       MI   . Hypertension Neg Hx   . Hyperlipidemia Neg Hx   . Heart attack Neg Hx   . Diabetes Neg Hx   . Colon cancer Neg Hx   . Prostate cancer Neg Hx     Social History:  reports that he quit smoking about 19 years ago. He has never used smokeless tobacco. He reports current alcohol use. He reports that he does not use drugs.    Review of Systems   No history of hypertension       Lipids: He has been treated with Crestor 20 mg by PCP Previous LDL as high as 114 Recently it  is down to 73 and he thinks he is taking his rosuvastatin regularly  Positive history of sudden death in his family with his brother dying at the age of 54     Lab Results  Component Value Date   CHOL 136 10/15/2018   CHOL 163 10/22/2017   CHOL 184 10/07/2016   Lab Results  Component Value Date   HDL 47.90 10/15/2018   HDL 48.30 10/22/2017   HDL 49.30 10/07/2016   Lab Results  Component Value Date   LDLCALC 73 10/15/2018   LDLCALC 93 10/22/2017   LDLCALC 114 (H) 10/07/2016   Lab Results  Component Value Date   TRIG 77.0 10/15/2018   TRIG 109.0 10/22/2017   TRIG 107.0 10/07/2016   Lab Results  Component Value Date   CHOLHDL 3 10/15/2018   CHOLHDL 3 10/22/2017   CHOLHDL 4 10/07/2016   Lab Results  Component Value Date   LDLDIRECT 178.1 04/20/2013   LDLDIRECT 173.6 05/14/2012   LDLDIRECT 119.7 02/27/2011      LABS:  Lab on 12/13/2018  Component Date Value Ref Range Status  . Sodium 12/13/2018 139  135 - 145 mEq/L Final  . Potassium 12/13/2018 4.9  3.5 - 5.1 mEq/L Final  . Chloride 12/13/2018 103  96 - 112 mEq/L Final  . CO2 12/13/2018 25  19 - 32 mEq/L Final  . Glucose, Bld 12/13/2018 124* 70 - 99 mg/dL Final  . BUN 12/13/2018 14  6 - 23 mg/dL Final  . Creatinine, Ser 12/13/2018 0.87  0.40 - 1.50 mg/dL Final  . Calcium 12/13/2018 9.6  8.4 - 10.5 mg/dL Final  . GFR 12/13/2018 93.68  >60.00 mL/min Final  . Fructosamine 12/13/2018 302* 0 - 285 umol/L Final   Comment: Published reference interval for apparently healthy subjects between age 75 and 7 is 50 - 285 umol/L and in a poorly controlled diabetic population is 228 - 563 umol/L with a mean of 396 umol/L.     Physical Examination:  There were no vitals taken for this visit.     ASSESSMENT:  Diabetes type 2 with mild obesity  See history of present illness for discussion of current diabetes management, blood sugar patterns and problems identified  His A1c is 8.1 on the last visit but  fructosamine is 301 indicating relatively better reading  He is benefiting from adding [place] He has mild GI side effects from this   Discussed needing to do the following changes  To be checking blood sugars regularly and do more readings after meals  Increase exercise to at least 4 days a week  Call if blood sugars are consistently high  Cut back on portions of carbohydrates or use more whole-grain products including rice  Change Farxiga to the morning right before breakfast instead of bedtime  Consider switching to Xigduo if he continues to have GI problems  Use Januvia separately  A1c on the next visit  Follow-up in 3 months   There are no Patient Instructions on file for this visit.    Elayne Snare 12/15/2018, 10:33 AM

## 2018-12-27 ENCOUNTER — Other Ambulatory Visit: Payer: Self-pay

## 2018-12-27 MED ORDER — FARXIGA 10 MG PO TABS: 10.0000 mg | ORAL_TABLET | Freq: Every day | ORAL | 1 refills | Status: DC

## 2018-12-27 MED FILL — FARXIGA 10 MG TABLET: 10 | 30 days supply | Qty: 30 | Fill #1

## 2019-01-11 ENCOUNTER — Encounter: Payer: Self-pay | Admitting: Internal Medicine

## 2019-01-11 ENCOUNTER — Ambulatory Visit (INDEPENDENT_AMBULATORY_CARE_PROVIDER_SITE_OTHER): Payer: 59 | Admitting: Internal Medicine

## 2019-01-11 ENCOUNTER — Other Ambulatory Visit: Payer: Self-pay

## 2019-01-11 VITALS — BP 126/72 | HR 81 | Temp 97.4°F | Resp 16 | Ht 65.0 in | Wt 176.2 lb

## 2019-01-11 DIAGNOSIS — M25511 Pain in right shoulder: Secondary | ICD-10-CM

## 2019-01-11 DIAGNOSIS — Z0000 Encounter for general adult medical examination without abnormal findings: Secondary | ICD-10-CM

## 2019-01-11 NOTE — Patient Instructions (Addendum)
Per our records you are due for an eye exam. Please contact your eye doctor to schedule an appointment. Please have them send copies of your office visit notes to [place]. Our fax number is [phone].     GO TO THE FRONT DESK Schedule your next appointment    for a physical exam in 1 year

## 2019-01-11 NOTE — Assessment & Plan Note (Addendum)
-  Tdap 09-2016 -  pnm shot 2017 - prevnar at age 48  - flu shot recommended, pt declined, benefits discussed -- CCS: Early screening discussed, will start with an IFOB. --prostate ca screening: n/a --Labs good, recent labs okay --Diet and exercise discussed

## 2019-01-11 NOTE — Progress Notes (Signed)
Subjective:    Patient ID: Shawn Walter, male    DOB: March 06, 1971, 48 y.o.   MRN: [id number]  DOS:  01/11/2019 Type of visit - description: Here for CPX From time to time has right shoulder pain with certain movements.  No other concerns Denies lower extremity paresthesias  Review of Systems  Other than above, a 14 point review of systems is negative    Past Medical History:  Diagnosis Date  . Diabetes mellitus   . Hyperlipidemia   . Normal cardiac stress test 02-2011   . Renal cyst, right    Complex, Dr. Louis Meckel  . Urolithiasis 11-2013   L    Past Surgical History:  Procedure Laterality Date  . INCISE AND DRAIN ABCESS  07/11/11   perirectal abscess     Social History   Socioeconomic History  . Marital status: Married    Spouse name: Not on file  . Number of children: 2  . Years of education: Not on file  . Highest education level: Not on file  Occupational History  . Occupation: IT   Social Needs  . Financial resource strain: Not on file  . Food insecurity    Worry: Not on file    Inability: Not on file  . Transportation needs    Medical: Not on file    Non-medical: Not on file  Tobacco Use  . Smoking status: Former Smoker    Quit date: 05/13/1999    Years since quitting: 19.6  . Smokeless tobacco: Never Used  Substance and Sexual Activity  . Alcohol use: Yes    Comment: socially   . Drug use: No  . Sexual activity: Not on file  Lifestyle  . Physical activity    Days per week: Not on file    Minutes per session: Not on file  . Stress: Not on file  Relationships  . Social Herbalist on phone: Not on file    Gets together: Not on file    Attends religious service: Not on file    Active member of club or organization: Not on file    Attends meetings of clubs or organizations: Not on file    Relationship status: Not on file  . Intimate partner violence    Fear of current or ex partner: Not on file    Emotionally abused: Not on file     Physically abused: Not on file    Forced sexual activity: Not on file  Other Topics Concern  . Not on file  Social History Narrative   Married, born in [place]     children x 2  (daughter 2004, son 2010)      Family History  Problem Relation Age of Onset  . Breast cancer Mother   . Sudden death Brother 75       MI   . Hypertension Neg Hx   . Hyperlipidemia Neg Hx   . Heart attack Neg Hx   . Diabetes Neg Hx   . Colon cancer Neg Hx   . Prostate cancer Neg Hx      Allergies as of 01/11/2019   No Known Allergies     Medication List       Accurate as of January 11, 2019 11:59 PM. If you have any questions, ask your nurse or doctor.        Accu-Chek [employer] Kit Use as instructed to check blood sugar daily.   Accu-Chek FastClix Lancets Misc Use  as instructed to check blood sugar daily.   Farxiga 10 MG Tabs tablet Generic drug: dapagliflozin propanediol Take 10 mg by mouth daily.   FreeStyle Freedom Lite w/Device Kit Use as instructed to check blood sugar once daily.   glucose blood test strip Commonly known as: FREESTYLE LITE 1 each by Other route daily. Use freestyle lite test strip as instructed to check blood sugar once daily.   pioglitazone 15 MG tablet Commonly known as: ACTOS Take 1 tablet (15 mg total) by mouth daily.   rosuvastatin 20 MG tablet Commonly known as: CRESTOR TAKE 1 TABLET (20 MG TOTAL) BY MOUTH DAILY.   sitaGLIPtin-metformin 50-1000 MG tablet Commonly known as: Janumet Take 1 tablet by mouth 2 (two) times daily.           Objective:   Physical Exam BP 126/72 (BP Location: Left Arm, Patient Position: Sitting, Cuff Size: Small)   Pulse 81   Temp (!) 97.4 F (36.3 C) (Temporal)   Resp 16   Ht _0  (1.651 m)   Wt 176 lb 4 oz (79.9 kg)   SpO2 99%   BMI 29.33 kg/m   General: Well developed, NAD, BMI noted Neck: No  thyromegaly  HEENT:  Normocephalic . Face symmetric, atraumatic Lungs:  CTA B Normal respiratory  effort, no intercostal retractions, no accessory muscle use. Heart: RRR,  no murmur.  No pretibial edema bilaterally  Abdomen:  Not distended, soft, non-tender. No rebound or rigidity.   DM foot exam: No edema, good pedal pulses, pinprick examination normal Neurologic:  alert & oriented X3.  Speech normal, gait appropriate for age and unassisted Strength symmetric and appropriate for age.  Psych: Cognition and judgment appear intact.  Cooperative with normal attention span and concentration.  Behavior appropriate. No anxious or depressed appearing.     Assessment      Assessment DM: Dr Dwyane Dee  Hyperlipidemia GU: --Urolithiasis  --L--  11-2013 --Multiple renal cysts per ultrasound 2016, MRI 10/2015:showing multiple bilateral Bosniak 1 and 2 renal cysts (no further eval per literature) --Pyelonephritis 09-2014 + FH CAD brother age 33.  Stress test (-) 2012  PLAN  DM: Per Endo, foot exam negative today Hyperlipidemia: Last LDL 73 Right shoulder pain: Refer to sports medicine Here for CPX RTC 1 year

## 2019-01-11 NOTE — Progress Notes (Signed)
Pre visit review using our clinic review tool, if applicable. No additional management support is needed unless otherwise documented below in the visit note. 

## 2019-01-12 NOTE — Assessment & Plan Note (Signed)
DM: Per Endo, foot exam negative today Hyperlipidemia: Last LDL 73 Right shoulder pain: Refer to sports medicine Here for CPX RTC 1 year

## 2019-01-18 ENCOUNTER — Other Ambulatory Visit: Payer: Self-pay | Admitting: Endocrinology

## 2019-01-18 MED FILL — PIOGLITAZONE HCL 15 MG TAB: 15 | 60 days supply | Qty: 60 | Fill #0

## 2019-01-18 MED FILL — JANUMET 50-1,000 MG TABLET: 50-1000 | 30 days supply | Qty: 60 | Fill #3

## 2019-01-20 MED FILL — FARXIGA 10 MG TABLET: 10 | 90 days supply | Qty: 90 | Fill #0

## 2019-01-23 NOTE — Progress Notes (Deleted)
[facility] [place], [place] [address] Phone: [phone] Subjective:    I'm seeing this patient by the request  of:  Colon Branch, MD   CC: Right shoulder pain  [id number]  Shawn Walter is a 48 y.o. male coming in with complaint of ***  Onset-  Location Duration-  Character- Aggravating factors- Reliving factors-  Therapies tried-  Severity-     Past Medical History:  Diagnosis Date  . Diabetes mellitus   . Hyperlipidemia   . Normal cardiac stress test 02-2011   . Renal cyst, right    Complex, Dr. Louis Meckel  . Urolithiasis 11-2013   L   Past Surgical History:  Procedure Laterality Date  . INCISE AND DRAIN ABCESS  07/11/11   perirectal abscess    Social History   Socioeconomic History  . Marital status: Married    Spouse name: Not on file  . Number of children: 2  . Years of education: Not on file  . Highest education level: Not on file  Occupational History  . Occupation: IT   Social Needs  . Financial resource strain: Not on file  . Food insecurity    Worry: Not on file    Inability: Not on file  . Transportation needs    Medical: Not on file    Non-medical: Not on file  Tobacco Use  . Smoking status: Former Smoker    Quit date: 05/13/1999    Years since quitting: 19.7  . Smokeless tobacco: Never Used  Substance and Sexual Activity  . Alcohol use: Yes    Comment: socially   . Drug use: No  . Sexual activity: Not on file  Lifestyle  . Physical activity    Days per week: Not on file    Minutes per session: Not on file  . Stress: Not on file  Relationships  . Social Herbalist on phone: Not on file    Gets together: Not on file    Attends religious service: Not on file    Active member of club or organization: Not on file    Attends meetings of clubs or organizations: Not on file    Relationship status: Not on file  Other Topics Concern  . Not on file  Social History Narrative   Married, born in [place]     children x 2  (daughter 2004, son 2010)    No Known Allergies Family History  Problem Relation Age of Onset  . Breast cancer Mother   . Sudden death Brother 66       MI   . Hypertension Neg Hx   . Hyperlipidemia Neg Hx   . Heart attack Neg Hx   . Diabetes Neg Hx   . Colon cancer Neg Hx   . Prostate cancer Neg Hx     Current Outpatient Medications (Endocrine & Metabolic):  .  dapagliflozin propanediol (FARXIGA) 10 MG TABS tablet, Take 10 mg by mouth daily. .  pioglitazone (ACTOS) 15 MG tablet, TAKE 1 TABLET (15 MG TOTAL) BY MOUTH DAILY. .  sitaGLIPtin-metformin (JANUMET) 50-1000 MG tablet, Take 1 tablet by mouth 2 (two) times daily.  Current Outpatient Medications (Cardiovascular):  .  rosuvastatin (CRESTOR) 20 MG tablet, TAKE 1 TABLET (20 MG TOTAL) BY MOUTH DAILY.     Current Outpatient Medications (Other):  Marland Kitchen  Accu-Chek FastClix Lancets MISC, Use as instructed to check blood sugar daily. .  Blood Glucose Monitoring Suppl (  FREESTYLE FREEDOM LITE) w/Device KIT, Use as instructed to check blood sugar once daily. Marland Kitchen  glucose blood (FREESTYLE LITE) test strip, 1 each by Other route daily. Use freestyle lite test strip as instructed to check blood sugar once daily. .  Lancets Misc. (ACCU-CHEK FASTCLIX LANCET) KIT, Use as instructed to check blood sugar daily.    Past medical history, social, surgical and family history all reviewed in electronic medical record.  No pertanent information unless stated regarding to the chief complaint.   Review of Systems:  No headache, visual changes, nausea, vomiting, diarrhea, constipation, dizziness, abdominal pain, skin rash, fevers, chills, night sweats, weight loss, swollen lymph nodes, body aches, joint swelling, muscle aches, chest pain, shortness of breath, mood changes.   Objective  There were no vitals taken for this visit. Systems examined below as of    General: No apparent distress alert and oriented x3  mood and affect normal, dressed appropriately.  HEENT: Pupils equal, extraocular movements intact  Respiratory: Patient's speak in full sentences and does not appear short of breath  Cardiovascular: No lower extremity edema, non tender, no erythema  Skin: Warm dry intact with no signs of infection or rash on extremities or on axial skeleton.  Abdomen: Soft nontender  Neuro: Cranial nerves II through XII are intact, neurovascularly intact in all extremities with 2+ DTRs and 2+ pulses.  Lymph: No lymphadenopathy of posterior or anterior cervical chain or axillae bilaterally.  Gait normal with good balance and coordination.  MSK:  Non tender with full range of motion and good stability and symmetric strength and tone of elbows, wrist, hip, knee and ankles bilaterally.  Shoulder: Right Inspection reveals no abnormalities, atrophy or asymmetry. Palpation is normal with no tenderness over AC joint or bicipital groove. ROM is full in all planes passively. Rotator cuff strength normal throughout. signs of impingement with positive Neer and Hawkin's tests, but negative empty can sign. Speeds and Yergason's tests normal. No labral pathology noted with negative Obrien's, negative clunk and good stability. Normal scapular function observed. No painful arc and no drop arm sign. No apprehension sign  MSK US performed of: Right This study was ordered, performed, and interpreted by Charlann Boxer D.O.  Shoulder:   Supraspinatus:  Appears normal on long and transverse views, Bursal bulge seen with shoulder abduction on impingement view. Infraspinatus:  Appears normal on long and transverse views. Significant increase in Doppler flow Subscapularis:  Appears normal on long and transverse views. Positive bursa Teres Minor:  Appears normal on long and transverse views. AC joint:  Capsule undistended, no geyser sign. Glenohumeral Joint:  Appears normal without effusion. Glenoid Labrum:  Intact without  visualized tears. Biceps Tendon:  Appears normal on long and transverse views, no fraying of tendon, tendon located in intertubercular groove, no subluxation with shoulder internal or external rotation.  Impression: Subacromial bursitis  Procedure: Real-time Ultrasound Guided Injection of right glenohumeral joint Device: GE Logiq E  Ultrasound guided injection is preferred based studies that show increased duration, increased effect, greater accuracy, decreased procedural pain, increased response rate with ultrasound guided versus blind injection.  Verbal informed consent obtained.  Time-out conducted.  Noted no overlying erythema, induration, or other signs of local infection.  Skin prepped in a sterile fashion.  Local anesthesia: Topical Ethyl chloride.  With sterile technique and under real time ultrasound guidance:  Joint visualized.  23g 1  inch needle inserted posterior approach. Pictures taken for needle placement. Patient did have injection of 2 cc of 1%  lidocaine, 2 cc of 0.5% Marcaine, and 1.0 cc of Kenalog 40 mg/dL. Completed without difficulty  Pain immediately resolved suggesting accurate placement of the medication.  Advised to call if fevers/chills, erythema, induration, drainage, or persistent bleeding.  Images permanently stored and available for review in the ultrasound unit.  Impression: Technically successful ultrasound guided injection.   Impression and Recommendations:     This case required medical decision making of moderate complexity. The above documentation has been reviewed and is accurate and complete Shawn Pulley, DO       Note: This dictation was prepared with Dragon dictation along with smaller phrase technology. Any transcriptional errors that result from this process are unintentional.

## 2019-01-25 ENCOUNTER — Ambulatory Visit: Payer: 59 | Admitting: Family Medicine

## 2019-02-11 MED FILL — ROSUVASTATIN CALCIUM 20 MG: 20 | 90 days supply | Qty: 90 | Fill #0

## 2019-02-18 ENCOUNTER — Other Ambulatory Visit: Payer: Self-pay | Admitting: Endocrinology

## 2019-02-18 MED FILL — JANUMET 50-1,000 MG TABLET: 50-1000 | 30 days supply | Qty: 60 | Fill #0

## 2019-03-05 NOTE — Progress Notes (Signed)
[facility] [place], [place] [address] Phone: [phone] Subjective:   Fontaine No, am serving as a scribe for Dr. Hulan Saas.  I'm seeing this patient by the request  of:  Colon Branch, MD   CC: Right shoulder pain  PPI:[id number]  Sh[facility] is a 48 y.o. male coming in with complaint of right shoulder pain over superior aspect. Has seen someone 7 years ago for similar issue. Does have numbness into the right arm just past elbow. Pain is worse when he is not moving. Feels a pinching sensation in right shoulder especially when lying on shoulder. Pain can be 5/10. Does use Advil prn.       Past Medical History:  Diagnosis Date   Diabetes mellitus    Hyperlipidemia    Normal cardiac stress test 02-2011    Renal cyst, right    Complex, Dr. Louis Meckel   Urolithiasis 11-2013   L   Past Surgical History:  Procedure Laterality Date   INCISE AND DRAIN ABCESS  07/11/11   perirectal abscess    Social History   Socioeconomic History   Marital status: Married    Spouse name: Not on file   Number of children: 2   Years of education: Not on file   Highest education level: Not on file  Occupational History   Occupation: IT   Social Needs   Financial resource strain: Not on file   Food insecurity    Worry: Not on file    Inability: Not on file   Transportation needs    Medical: Not on file    Non-medical: Not on file  Tobacco Use   Smoking status: Former Smoker    Quit date: 05/13/1999    Years since quitting: 19.8   Smokeless tobacco: Never Used  Substance and Sexual Activity   Alcohol use: Yes    Comment: socially    Drug use: No   Sexual activity: Not on file  Lifestyle   Physical activity    Days per week: Not on file    Minutes per session: Not on file   Stress: Not on file  Relationships   Social connections    Talks on phone: Not on file    Gets together: Not on file    Attends religious  service: Not on file    Active member of club or organization: Not on file    Attends meetings of clubs or organizations: Not on file    Relationship status: Not on file  Other Topics Concern   Not on file  Social History Narrative   Married, Shawn [facility]     children x 2  (daughter 2004, son 2010)    No Known Allergies Family History  Problem Relation Age of Onset   Breast cancer Mother    Sudden death Brother 84       MI    Hypertension Neg Hx    Hyperlipidemia Neg Hx    Heart attack Neg Hx    Diabetes Neg Hx    Colon cancer Neg Hx    Prostate cancer Neg Hx     Current Outpatient Medications (Endocrine & Metabolic):    dapagliflozin propanediol (FARXIGA) 10 MG TABS tablet, Take 10 mg by mouth daily.   JANUMET 50-1000 MG tablet, TAKE 1 TABLET BY MOUTH 2 TIMES DAILY.   pioglitazone (ACTOS) 15 MG tablet, TAKE 1 TABLET (15 MG TOTAL) BY MOUTH DAILY.  Current  Outpatient Medications (Cardiovascular):    rosuvastatin (CRESTOR) 20 MG tablet, TAKE 1 TABLET (20 MG TOTAL) BY MOUTH DAILY.   Current Outpatient Medications (Analgesics):    meloxicam (MOBIC) 15 MG tablet, Take 1 tablet (15 mg total) by mouth daily.   Current Outpatient Medications (Other):    Accu-Chek FastClix Lancets MISC, Use as instructed to check blood sugar daily.   Blood Glucose Monitoring Suppl (FREESTYLE FREEDOM LITE) w/Device KIT, Use as instructed to check blood sugar once daily.   glucose blood (FREESTYLE LITE) test strip, 1 each by Other route daily. Use freestyle lite test strip as instructed to check blood sugar once daily.   Lancets Misc. (ACCU-CHEK FASTCLIX LANCET) KIT, Use as instructed to check blood sugar daily.    Past medical history, social, surgical and family history all reviewed in electronic medical record.  No pertanent information unless stated regarding to the chief complaint.   Review of Systems:  No headache, visual changes, nausea, vomiting, diarrhea, constipation,  dizziness, abdominal pain, skin rash, fevers, chills, night sweats, weight loss, swollen lymph nodes, body aches, joint swelling,  chest pain, shortness of breath, mood changes.  Positive muscle aches  Objective  Blood pressure 128/82, pulse 77, height '5\' 5"'  (1.651 m), SpO2 99 %.    General: No apparent distress alert and oriented x3 mood and affect normal, dressed appropriately.  HEENT: Pupils equal, extraocular movements intact  Respiratory: Patient's speak in full sentences and does not appear short of breath  Cardiovascular: No lower extremity edema, non tender, no erythema  Skin: Warm dry intact with no signs of infection or rash on extremities or on axial skeleton.  Abdomen: Soft nontender  Neuro: Cranial nerves II through XII are intact, neurovascularly intact in all extremities with 2+ DTRs and 2+ pulses.  Lymph: No lymphadenopathy of posterior or anterior cervical chain or axillae bilaterally.  Gait normal with good balance and coordination.  MSK:  Non tender with full range of motion and good stability and symmetric strength and tone of , elbows, wrist, hip, knee and ankles bilaterally.  Shoulder: Right Inspection reveals no abnormalities, atrophy or asymmetry. Palpation is normal with no tenderness over AC joint or bicipital groove. ROM is full in all planes passively. Rotator cuff strength normal throughout. signs of impingement with positive Neer and Hawkin's tests, but negative empty can sign. Speeds and Yergason's tests normal. No labral pathology noted with negative Obrien's, negative clunk and good stability. Normal scapular function observed. No painful arc and no drop arm sign. No apprehension sign  MSK US performed of: Right This study was ordered, performed, and interpreted by Charlann Boxer D.O.  Shoulder:   Supraspinatus:  Appears normal on long and transverse views, Bursal bulge seen with shoulder abduction on impingement view. Infraspinatus:  Appears normal on  long and transverse views. Significant increase in Doppler flow Subscapularis:  Appears normal on long and transverse views. Positive bursa Teres Minor:  Appears normal on long and transverse views. AC joint:  Capsule undistended, no geyser sign. Glenohumeral Joint:  Appears normal without effusion. Glenoid Labrum:  Intact without visualized tears. Biceps Tendon:  Appears normal on long and transverse views, no fraying of tendon, tendon located in intertubercular groove, no subluxation with shoulder internal or external rotation.  Impression: Subacromial bursitis  [id number]; 15 additional minutes spent for Therapeutic exercises as stated in above notes.  This included exercises focusing on stretching, strengthening, with significant focus on eccentric aspects.   Long term goals include an improvement in range  of motion, strength, endurance as well as avoiding reinjury. Patient's frequency would include in 1-2 times a day, 3-5 times a week for a duration of 6-12 weeks.  Shoulder Exercises that included:  Basic scapular stabilization to include adduction and depression of scapula Scaption, focusing on proper movement and good control Internal and External rotation utilizing a theraband, with elbow tucked at side entire time Rows with theraband  Have we seen this significant his name proper technique shown and discussed handout in great detail with ATC.  All questions were discussed and answered.     Impression and Recommendations:     This case required medical decision making of moderate complexity. The above documentation has been reviewed and is accurate and complete Lyndal Pulley, DO       Note: This dictation was prepared with Dragon dictation along with smaller phrase technology. Any transcriptional errors that result from this process are unintentional.

## 2019-03-07 ENCOUNTER — Ambulatory Visit: Payer: 59 | Admitting: Family Medicine

## 2019-03-07 ENCOUNTER — Other Ambulatory Visit: Payer: Self-pay

## 2019-03-07 ENCOUNTER — Ambulatory Visit: Payer: Self-pay

## 2019-03-07 ENCOUNTER — Encounter: Payer: Self-pay | Admitting: Family Medicine

## 2019-03-07 VITALS — BP 128/82 | HR 77 | Ht 65.0 in

## 2019-03-07 DIAGNOSIS — G8929 Other chronic pain: Secondary | ICD-10-CM

## 2019-03-07 DIAGNOSIS — M25511 Pain in right shoulder: Secondary | ICD-10-CM

## 2019-03-07 MED ORDER — MELOXICAM 15 MG PO TABS: 15.0000 mg | ORAL_TABLET | Freq: Every day | ORAL | 0 refills | Status: DC

## 2019-03-07 MED FILL — MELOXICAM 15 MG TABLET: 15 | 30 days supply | Qty: 30 | Fill #0

## 2019-03-07 NOTE — Patient Instructions (Signed)
Good to see you.  Ice 20 minutes 2 times daily. Usually after activity and before bed. Exercises 3 times a week.  Vitamin D 2000 IU daily  Meloxicam daily for 10 days then as needed  See me again in 6 weeks

## 2019-03-07 NOTE — Assessment & Plan Note (Signed)
Patient does have impingement type syndrome that secondary to a subacromial bursitis.  Patient wanted to hold on any injection, restart meloxicam, discussed icing regimen, home exercises, discussed over-the-counter medications that could be beneficial.  Follow-up with me in 10 4 to 8 weeks.  If no improvement consider formal physical therapy and injection

## 2019-03-15 ENCOUNTER — Other Ambulatory Visit: Payer: Self-pay

## 2019-03-15 ENCOUNTER — Other Ambulatory Visit (INDEPENDENT_AMBULATORY_CARE_PROVIDER_SITE_OTHER): Payer: 59

## 2019-03-15 DIAGNOSIS — E1165 Type 2 diabetes mellitus with hyperglycemia: Secondary | ICD-10-CM | POA: Diagnosis not present

## 2019-03-15 LAB — COMPREHENSIVE METABOLIC PANEL
ALT: 27 U/L (ref 0–53)
AST: 23 U/L (ref 0–37)
Albumin: 4 g/dL (ref 3.5–5.2)
Alkaline Phosphatase: 53 U/L (ref 39–117)
BUN: 12 mg/dL (ref 6–23)
CO2: 27 mEq/L (ref 19–32)
Calcium: 8.8 mg/dL (ref 8.4–10.5)
Chloride: 104 mEq/L (ref 96–112)
Creatinine, Ser: 0.81 mg/dL (ref 0.40–1.50)
GFR: 101.62 mL/min (ref >60.00)
Glucose, Bld: 132 mg/dL — ABNORMAL HIGH (ref 70–99)
Potassium: 4.7 mEq/L (ref 3.5–5.1)
Sodium: 138 mEq/L (ref 135–145)
Total Bilirubin: 0.6 mg/dL (ref 0.2–1.2)
Total Protein: 7.1 g/dL (ref 6.0–8.3)

## 2019-03-16 LAB — HEMOGLOBIN A1C: Hgb A1c MFr Bld: 7.2 % — ABNORMAL HIGH (ref 4.6–6.5)

## 2019-03-18 ENCOUNTER — Encounter: Payer: Self-pay | Admitting: Endocrinology

## 2019-03-18 ENCOUNTER — Ambulatory Visit (INDEPENDENT_AMBULATORY_CARE_PROVIDER_SITE_OTHER): Payer: 59 | Admitting: Endocrinology

## 2019-03-18 ENCOUNTER — Other Ambulatory Visit: Payer: Self-pay

## 2019-03-18 DIAGNOSIS — E785 Hyperlipidemia, unspecified: Secondary | ICD-10-CM | POA: Diagnosis not present

## 2019-03-18 DIAGNOSIS — E1165 Type 2 diabetes mellitus with hyperglycemia: Secondary | ICD-10-CM

## 2019-03-18 NOTE — Progress Notes (Signed)
Shawn Walter 48 y.o.            Today's office visit was provided via telemedicine using video technique Explained to the patient and the the limitations of evaluation and management by telemedicine and the availability of in person appointments.  The patient understood the limitations and agreed to proceed. Patient also understood that the telehealth visit is billable. . Location of the patient: Home . Location of the provider: Office Only the patient and myself were participating in the encounter   Reason for Appointment : Followup for Type 2 Diabetes  History of Present Illness          Diagnosis: Type 2 diabetes mellitus, date of diagnosis: 2004        Past history: His glucose was about 220 at diagnosis. He thinks he was not treated with medications but told to modify his diet and start exercise. Not clear how his diabetes was controlled with this regimen He thinks about 3 years ago he was started on metformin, probably when his A1c was 8.2%. Blood sugar was somewhat better but his A1c at best was 7.1% Although he had some initial improvement with metformin initially his blood sugars have been generally higher this year especially in 1/14 At some point he was also given Amaryl 4 mg but this was stopped when he was having low blood sugars Prior to his initial consultation he had been totally noncompliant with diet, exercise and glucose monitoring. He  probably gained weight also. He was on Janumet and Actos but his glucose on the lab was 348 along with A1c of 9.5  Recent history:   Treatment regimen: Janumet 50/1000 twice a day and Actos 15 mg daily, Farxiga 45m daily  His last A1c was 8.1 and is now 7.2 Fructosamine was 302  Current problems, management and blood sugar levels:  He is now taking 10 mg of Farxiga in the morning before breakfast and this was changed from the bedtime dose on his last visit  He has tried to do much better with using whole-wheat roti instead  of rice in the evenings at dinnertime  However he is still not able to consistently control snacking  Has been only walking on treadmill once a week and does not try to find time to do more exercise  His blood sugars are mostly checked in the morning which have been usually near 100  Has only a couple of readings after eating which are fairly good  No side effects from his medications currently    Glucose monitoring:  less than once a day       Glucometer: Accu-Chek    Blood Glucose readings from patient review:    PRE-MEAL Fasting Lunch Dinner Bedtime Overall  Glucose range:  119-130      Mean/median:     ?   POST-MEAL PC Breakfast PC Lunch PC Dinner  Glucose range:   140  130  Mean/median:        Dietician visit: Most recent: At the time of diagnosis                  Wt Readings from Last 3 Encounters:  01/11/19 176 lb 4 oz (79.9 kg)  11/03/17 179 lb 6 oz (81.4 kg)  11/02/17 181 lb 6.4 oz (82.3 kg)    Lab Results  Component Value Date   HGBA1C 7.2 (H) 03/15/2019   HGBA1C 8.1 (H) 10/15/2018   HGBA1C 9.1 (H) 09/06/2018   Lab Results  Component Value Date   MICROALBUR 5.5 (H) 09/06/2018   LDLCALC 73 10/15/2018   CREATININE 0.81 03/15/2019   Lab on 03/15/2019  Component Date Value Ref Range Status  . Sodium 03/15/2019 138  135 - 145 mEq/L Final  . Potassium 03/15/2019 4.7  3.5 - 5.1 mEq/L Final  . Chloride 03/15/2019 104  96 - 112 mEq/L Final  . CO2 03/15/2019 27  19 - 32 mEq/L Final  . Glucose, Bld 03/15/2019 132* 70 - 99 mg/dL Final  . BUN 03/15/2019 12  6 - 23 mg/dL Final  . Creatinine, Ser 03/15/2019 0.81  0.40 - 1.50 mg/dL Final  . Total Bilirubin 03/15/2019 0.6  0.2 - 1.2 mg/dL Final  . Alkaline Phosphatase 03/15/2019 53  39 - 117 U/L Final  . AST 03/15/2019 23  0 - 37 U/L Final  . ALT 03/15/2019 27  0 - 53 U/L Final  . Total Protein 03/15/2019 7.1  6.0 - 8.3 g/dL Final  . Albumin 03/15/2019 4.0  3.5 - 5.2 g/dL Final  . Calcium 03/15/2019 8.8  8.4 -  10.5 mg/dL Final  . GFR 03/15/2019 101.62  >60.00 mL/min Final  . Hgb A1c MFr Bld 03/15/2019 7.2* 4.6 - 6.5 % Final   Glycemic Control Guidelines for People with Diabetes:Non Diabetic:  <6%Goal of Therapy: <7%Additional Action Suggested:  >8%      Allergies as of 03/18/2019   No Known Allergies     Medication List       Accurate as of March 18, 2019 10:15 AM. If you have any questions, ask your nurse or doctor.        Accu-Chek [employer] Kit Use as instructed to check blood sugar daily.   Accu-Chek FastClix Lancets Misc Use as instructed to check blood sugar daily.   Farxiga 10 MG Tabs tablet Generic drug: dapagliflozin propanediol Take 10 mg by mouth daily.   FreeStyle Freedom Lite w/Device Kit Use as instructed to check blood sugar once daily.   glucose blood test strip Commonly known as: FREESTYLE LITE 1 each by Other route daily. Use freestyle lite test strip as instructed to check blood sugar once daily.   Janumet 50-1000 MG tablet Generic drug: sitaGLIPtin-metformin TAKE 1 TABLET BY MOUTH 2 TIMES DAILY.   meloxicam 15 MG tablet Commonly known as: MOBIC Take 1 tablet (15 mg total) by mouth daily.   pioglitazone 15 MG tablet Commonly known as: ACTOS TAKE 1 TABLET (15 MG TOTAL) BY MOUTH DAILY.   rosuvastatin 20 MG tablet Commonly known as: CRESTOR TAKE 1 TABLET (20 MG TOTAL) BY MOUTH DAILY.         Allergies: No Known Allergies  Past Medical History:  Diagnosis Date  . Diabetes mellitus   . Hyperlipidemia   . Normal cardiac stress test 02-2011   . Renal cyst, right    Complex, Dr. Louis Meckel  . Urolithiasis 11-2013   L    Past Surgical History:  Procedure Laterality Date  . INCISE AND DRAIN ABCESS  07/11/11   perirectal abscess     Family History  Problem Relation Age of Onset  . Breast cancer Mother   . Sudden death Brother 17       MI   . Hypertension Neg Hx   . Hyperlipidemia Neg Hx   . Heart attack Neg Hx   . Diabetes Neg Hx    . Colon cancer Neg Hx   . Prostate cancer Neg Hx     Social History:  reports  that he quit smoking about 19 years ago. He has never used smokeless tobacco. He reports current alcohol use. He reports that he does not use drugs.    Review of Systems   No history of hypertension       Lipids: He has been treated with Crestor 20 mg by PCP Previous LDL as high as 114 In June it is down to 28 and he thinks he is taking his rosuvastatin regularly  Positive history of sudden death in his family with his brother dying at the age of 35     Lab Results  Component Value Date   CHOL 136 10/15/2018   CHOL 163 10/22/2017   CHOL 184 10/07/2016   Lab Results  Component Value Date   HDL 47.90 10/15/2018   HDL 48.30 10/22/2017   HDL 49.30 10/07/2016   Lab Results  Component Value Date   LDLCALC 73 10/15/2018   LDLCALC 93 10/22/2017   LDLCALC 114 (H) 10/07/2016   Lab Results  Component Value Date   TRIG 77.0 10/15/2018   TRIG 109.0 10/22/2017   TRIG 107.0 10/07/2016   Lab Results  Component Value Date   CHOLHDL 3 10/15/2018   CHOLHDL 3 10/22/2017   CHOLHDL 4 10/07/2016   Lab Results  Component Value Date   LDLDIRECT 178.1 04/20/2013   LDLDIRECT 173.6 05/14/2012   LDLDIRECT 119.7 02/27/2011      LABS:  Lab on 03/15/2019  Component Date Value Ref Range Status  . Sodium 03/15/2019 138  135 - 145 mEq/L Final  . Potassium 03/15/2019 4.7  3.5 - 5.1 mEq/L Final  . Chloride 03/15/2019 104  96 - 112 mEq/L Final  . CO2 03/15/2019 27  19 - 32 mEq/L Final  . Glucose, Bld 03/15/2019 132* 70 - 99 mg/dL Final  . BUN 03/15/2019 12  6 - 23 mg/dL Final  . Creatinine, Ser 03/15/2019 0.81  0.40 - 1.50 mg/dL Final  . Total Bilirubin 03/15/2019 0.6  0.2 - 1.2 mg/dL Final  . Alkaline Phosphatase 03/15/2019 53  39 - 117 U/L Final  . AST 03/15/2019 23  0 - 37 U/L Final  . ALT 03/15/2019 27  0 - 53 U/L Final  . Total Protein 03/15/2019 7.1  6.0 - 8.3 g/dL Final  . Albumin 03/15/2019 4.0   3.5 - 5.2 g/dL Final  . Calcium 03/15/2019 8.8  8.4 - 10.5 mg/dL Final  . GFR 03/15/2019 101.62  >60.00 mL/min Final  . Hgb A1c MFr Bld 03/15/2019 7.2* 4.6 - 6.5 % Final   Glycemic Control Guidelines for People with Diabetes:Non Diabetic:  <6%Goal of Therapy: <7%Additional Action Suggested:  >8%     Physical Examination:  There were no vitals taken for this visit.     ASSESSMENT:  Diabetes type 2 with mild obesity  See history of present illness for discussion of current diabetes management, blood sugar patterns and problems identified  His A1c is significantly improved at 7.2, was 8.1 previously  He is benefiting from continuing [place] especially with switching to the morning Most of his hyperglycemia has been postprandial Although he is trying to do better with his diet and will cutting back on carbohydrates like rice he can still do consistently better with exercise and cut back on carbohydrates overall  Needs to monitor blood sugars after meals more consistently rather than in the morning Recommended that he try to exercise every other day   Also will try to have his flu shot done either here or  at the drugstore  Follow-up in 3 months   There are no Patient Instructions on file for this visit.    Elayne Snare 03/18/2019, 10:15 AM

## 2019-03-22 MED FILL — JANUMET 50-1,000 MG TABLET: 50-1000 | 30 days supply | Qty: 60 | Fill #1

## 2019-03-29 ENCOUNTER — Other Ambulatory Visit: Payer: Self-pay | Admitting: Endocrinology

## 2019-03-29 MED FILL — PIOGLITAZONE HCL 15 MG TAB: 15 | 60 days supply | Qty: 60 | Fill #0

## 2019-03-30 ENCOUNTER — Other Ambulatory Visit: Payer: Self-pay

## 2019-03-30 ENCOUNTER — Telehealth: Payer: Self-pay | Admitting: Endocrinology

## 2019-03-30 MED ORDER — PIOGLITAZONE HCL 15 MG PO TABS: 15.0000 mg | ORAL_TABLET | Freq: Every day | ORAL | 0 refills | Status: DC

## 2019-03-30 NOTE — Telephone Encounter (Signed)
Rx sent 

## 2019-03-30 NOTE — Telephone Encounter (Signed)
MEDICATION: Pioglitazone 15 MG  PHARMACY:  [facility] [phone]  IS THIS A 90 DAY SUPPLY : yes  IS PATIENT OUT OF MEDICATION: No  IF NOT; HOW MUCH IS LEFT:   LAST APPOINTMENT DATE: @11 /17/2020  NEXT APPOINTMENT DATE:@Visit  date not found  DO WE HAVE YOUR PERMISSION TO LEAVE A DETAILED MESSAGE:  OTHER COMMENTS:    **Let patient know to contact pharmacy at the end of the day to make sure medication is ready. **  ** Please notify patient to allow 48-72 hours to process**  **Encourage patient to contact the pharmacy for refills or they can request refills through [facility]**

## 2019-04-01 ENCOUNTER — Other Ambulatory Visit: Payer: Self-pay

## 2019-04-01 MED ORDER — PIOGLITAZONE HCL 15 MG PO TABS: 15.0000 mg | ORAL_TABLET | Freq: Every day | ORAL | 0 refills | Status: DC

## 2019-04-14 ENCOUNTER — Other Ambulatory Visit: Payer: Self-pay

## 2019-04-14 ENCOUNTER — Ambulatory Visit (INDEPENDENT_AMBULATORY_CARE_PROVIDER_SITE_OTHER): Payer: 59

## 2019-04-14 DIAGNOSIS — Z23 Encounter for immunization: Secondary | ICD-10-CM

## 2019-04-14 NOTE — Progress Notes (Signed)
Pt here today for flu vaccine.   0.17mL injected into L deltoid. Pt tolerated injection well.

## 2019-04-19 ENCOUNTER — Encounter: Payer: Self-pay | Admitting: Family Medicine

## 2019-04-19 ENCOUNTER — Other Ambulatory Visit: Payer: Self-pay

## 2019-04-19 ENCOUNTER — Ambulatory Visit (INDEPENDENT_AMBULATORY_CARE_PROVIDER_SITE_OTHER): Payer: 59 | Admitting: Family Medicine

## 2019-04-19 DIAGNOSIS — G8929 Other chronic pain: Secondary | ICD-10-CM

## 2019-04-19 DIAGNOSIS — M25511 Pain in right shoulder: Secondary | ICD-10-CM

## 2019-04-19 MED ORDER — MELOXICAM 7.5 MG PO TABS: 7.5000 mg | ORAL_TABLET | Freq: Every day | ORAL | 1 refills | Status: DC

## 2019-04-19 MED FILL — MELOXICAM 7.5 MG TABLET: 7.5 | 90 days supply | Qty: 90 | Fill #0

## 2019-04-19 NOTE — Assessment & Plan Note (Signed)
Subacromial bursitis.  Discussed with patient in great length about icing regimen and home exercises.  Patient will have a refill on the meloxicam.  Discussed taking it for 30 days.  Patient will monitor for any side effects.  Worsening symptoms will consider injection or formal physical therapy.  Patient follow-up with me again in 6 to 7 weeks

## 2019-04-19 NOTE — Patient Instructions (Addendum)
  [address], [address] [place], [place] [address] Phone [phone]   Meloxicam daily for a month  Continue exercises See me again in 6-7 weeks

## 2019-04-19 NOTE — Progress Notes (Signed)
[facility] [place], [place] [address] Phone: [phone] Subjective:   I Shawn Walter am serving as a Education administrator for Dr. Hulan Saas.   This visit occurred during the SARS-CoV-2 public health emergency.  Safety protocols were in place, including screening questions prior to the visit, additional usage of staff PPE, and extensive cleaning of exam room while observing appropriate contact time as indicated for disinfecting solutions.     CC: Right shoulder pain follow-up  LPF:[id number]   03/07/2019 Patient does have impingement type syndrome that secondary to a subacromial bursitis.  Patient wanted to hold on any injection, restart meloxicam, discussed icing regimen, home exercises, discussed over-the-counter medications that could be beneficial.  Follow-up with me in 10 4 to 8 weeks.  If no improvement consider formal physical therapy and injection  04/19/2019 Shawn Walter is a 48 y.o. male coming in with complaint of right shoulder pain. States he has a little pain. Exercises help.  Patient would state approximately 60% better.  Still some pain at night.  Patient has not been as active.  States that when he was taking the meloxicam was 100% better.    Past Medical History:  Diagnosis Date  . Diabetes mellitus   . Hyperlipidemia   . Normal cardiac stress test 02-2011   . Renal cyst, right    Complex, Dr. Louis Meckel  . Urolithiasis 11-2013   L   Past Surgical History:  Procedure Laterality Date  . INCISE AND DRAIN ABCESS  07/11/11   perirectal abscess    Social History   Socioeconomic History  . Marital status: Married    Spouse name: Not on file  . Number of children: 2  . Years of education: Not on file  . Highest education level: Not on file  Occupational History  . Occupation: IT   Social Needs  . Financial resource strain: Not on file  . Food insecurity    Worry: Not on file    Inability: Not on file  . Transportation  needs    Medical: Not on file    Non-medical: Not on file  Tobacco Use  . Smoking status: Former Smoker    Quit date: 05/13/1999    Years since quitting: 19.9  . Smokeless tobacco: Never Used  Substance and Sexual Activity  . Alcohol use: Yes    Comment: socially   . Drug use: No  . Sexual activity: Not on file  Lifestyle  . Physical activity    Days per week: Not on file    Minutes per session: Not on file  . Stress: Not on file  Relationships  . Social Herbalist on phone: Not on file    Gets together: Not on file    Attends religious service: Not on file    Active member of club or organization: Not on file    Attends meetings of clubs or organizations: Not on file    Relationship status: Not on file  Other Topics Concern  . Not on file  Social History Narrative   Married, born in [place]     children x 2  (daughter 2004, son 2010)    No Known Allergies Family History  Problem Relation Age of Onset  . Breast cancer Mother   . Sudden death Brother 50       MI   . Hypertension Neg Hx   . Hyperlipidemia Neg Hx   . Heart attack Neg Hx   .  Diabetes Neg Hx   . Colon cancer Neg Hx   . Prostate cancer Neg Hx     Current Outpatient Medications (Endocrine & Metabolic):  .  dapagliflozin propanediol (FARXIGA) 10 MG TABS tablet, Take 10 mg by mouth daily. Marland Kitchen  JANUMET 50-1000 MG tablet, TAKE 1 TABLET BY MOUTH 2 TIMES DAILY. Marland Kitchen  pioglitazone (ACTOS) 15 MG tablet, Take 1 tablet (15 mg total) by mouth daily.  Current Outpatient Medications (Cardiovascular):  .  rosuvastatin (CRESTOR) 20 MG tablet, TAKE 1 TABLET (20 MG TOTAL) BY MOUTH DAILY.   Current Outpatient Medications (Analgesics):  .  meloxicam (MOBIC) 15 MG tablet, Take 1 tablet (15 mg total) by mouth daily. .  meloxicam (MOBIC) 7.5 MG tablet, Take 1 tablet (7.5 mg total) by mouth daily.   Current Outpatient Medications (Other):  Marland Kitchen  Accu-Chek FastClix Lancets MISC, Use as instructed to check blood sugar  daily. .  Blood Glucose Monitoring Suppl (FREESTYLE FREEDOM LITE) w/Device KIT, Use as instructed to check blood sugar once daily. Marland Kitchen  glucose blood (FREESTYLE LITE) test strip, 1 each by Other route daily. Use freestyle lite test strip as instructed to check blood sugar once daily. .  Lancets Misc. (ACCU-CHEK FASTCLIX LANCET) KIT, Use as instructed to check blood sugar daily.    Past medical history, social, surgical and family history all reviewed in electronic medical record.  No pertanent information unless stated regarding to the chief complaint.   Review of Systems:  No headache, visual changes, nausea, vomiting, diarrhea, constipation, dizziness, abdominal pain, skin rash, fevers, chills, night sweats, weight loss, swollen lymph nodes, body aches, joint swelling,chest pain, shortness of breath, mood changes.  Positive muscle aches  Objective  Blood pressure 102/80, pulse 77, height _0  (1.651 m), weight 176 lb (79.8 kg), SpO2 96 %.    General: No apparent distress alert and oriented x3 mood and affect normal, dressed appropriately.  HEENT: Pupils equal, extraocular movements intact  Respiratory: Patient's speak in full sentences and does not appear short of breath  Cardiovascular: No lower extremity edema, non tender, no erythema  Skin: Warm dry intact with no signs of infection or rash on extremities or on axial skeleton.  Abdomen: Soft nontender  Neuro: Cranial nerves II through XII are intact, neurovascularly intact in all extremities with 2+ DTRs and 2+ pulses.  Lymph: No lymphadenopathy of posterior or anterior cervical chain or axillae bilaterally.  Gait antalgic  MSK:  tender with full range of motion and good stability and symmetric strength and tone of , elbows, wrist, hip, knee and ankles bilaterally.  Right shoulder exam shows the patient does have mild positive impingement.  Rotator cuff strength 5 out of 5.   Impression and Recommendations:     . The above  documentation has been reviewed and is accurate and complete Lyndal Pulley, DO       Note: This dictation was prepared with Dragon dictation along with smaller phrase technology. Any transcriptional errors that result from this process are unintentional.

## 2019-04-22 MED FILL — JANUMET 50-1,000 MG TABLET: 50-1000 | 90 days supply | Qty: 180 | Fill #0

## 2019-05-09 ENCOUNTER — Other Ambulatory Visit: Payer: Self-pay | Admitting: Endocrinology

## 2019-05-09 MED FILL — FARXIGA 10 MG TABLET: 10 | 90 days supply | Qty: 90 | Fill #0

## 2019-05-09 MED FILL — ROSUVASTATIN CALCIUM 20 MG: 20 | 90 days supply | Qty: 90 | Fill #0

## 2019-05-31 ENCOUNTER — Other Ambulatory Visit: Payer: Self-pay

## 2019-05-31 MED ORDER — PIOGLITAZONE HCL 15 MG PO TABS: 15.0000 mg | ORAL_TABLET | Freq: Every day | ORAL | 0 refills | Status: DC

## 2019-06-22 ENCOUNTER — Other Ambulatory Visit: Payer: Self-pay

## 2019-06-22 MED ORDER — PIOGLITAZONE HCL 15 MG PO TABS: 15.0000 mg | ORAL_TABLET | Freq: Every day | ORAL | 1 refills | Status: DC

## 2019-06-22 MED FILL — PIOGLITAZONE HCL 15 MG TAB: 15 | 90 days supply | Qty: 90 | Fill #0

## 2019-07-18 DIAGNOSIS — E1139 Type 2 diabetes mellitus with other diabetic ophthalmic complication: Secondary | ICD-10-CM | POA: Diagnosis not present

## 2019-07-18 LAB — HM DIABETES EYE EXAM

## 2019-08-01 ENCOUNTER — Other Ambulatory Visit: Payer: Self-pay | Admitting: Endocrinology

## 2019-08-01 MED FILL — FARXIGA 10 MG TABLET: 10 | 90 days supply | Qty: 90 | Fill #0

## 2019-08-01 MED FILL — ROSUVASTATIN CALCIUM 20 MG: 20 | 90 days supply | Qty: 90 | Fill #0

## 2019-08-01 MED FILL — JANUMET 50-1,000 MG TABLET: 50-1000 | 30 days supply | Qty: 60 | Fill #1

## 2019-09-08 ENCOUNTER — Other Ambulatory Visit: Payer: Self-pay | Admitting: Endocrinology

## 2019-09-09 MED FILL — JANUMET 50-1,000 MG TABLET: 50-1000 | 30 days supply | Qty: 60 | Fill #0

## 2019-10-11 MED FILL — JANUMET 50-1,000 MG TABLET: 50-1000 | 30 days supply | Qty: 60 | Fill #1

## 2019-11-09 ENCOUNTER — Other Ambulatory Visit: Payer: Self-pay | Admitting: Endocrinology

## 2019-11-09 MED FILL — JANUMET 50-1,000 MG TABLET: 50-1000 | 30 days supply | Qty: 60 | Fill #0

## 2019-11-09 MED FILL — ROSUVASTATIN CALCIUM 20 MG: 20 | 90 days supply | Qty: 90 | Fill #0

## 2019-11-16 ENCOUNTER — Other Ambulatory Visit: Payer: Self-pay

## 2019-11-16 ENCOUNTER — Emergency Department (HOSPITAL_BASED_OUTPATIENT_CLINIC_OR_DEPARTMENT_OTHER)
Admission: EM | Admit: 2019-11-16 | Discharge: 2019-11-16 | Disposition: A | Discharge status: Home / Self Care | Payer: 59 | Attending: Emergency Medicine | Admitting: Emergency Medicine

## 2019-11-16 ENCOUNTER — Encounter (HOSPITAL_BASED_OUTPATIENT_CLINIC_OR_DEPARTMENT_OTHER): Payer: Self-pay

## 2019-11-16 ENCOUNTER — Emergency Department (HOSPITAL_BASED_OUTPATIENT_CLINIC_OR_DEPARTMENT_OTHER): Payer: 59

## 2019-11-16 DIAGNOSIS — Z5321 Procedure and treatment not carried out due to patient leaving prior to being seen by health care provider: Secondary | ICD-10-CM | POA: Diagnosis not present

## 2019-11-16 DIAGNOSIS — M7989 Other specified soft tissue disorders: Secondary | ICD-10-CM | POA: Diagnosis not present

## 2019-11-16 DIAGNOSIS — S3992XA Unspecified injury of lower back, initial encounter: Secondary | ICD-10-CM | POA: Diagnosis not present

## 2019-11-16 DIAGNOSIS — M25572 Pain in left ankle and joints of left foot: Secondary | ICD-10-CM | POA: Diagnosis not present

## 2019-11-16 DIAGNOSIS — M25472 Effusion, left ankle: Secondary | ICD-10-CM | POA: Diagnosis not present

## 2019-11-16 DIAGNOSIS — S9002XA Contusion of left ankle, initial encounter: Secondary | ICD-10-CM | POA: Diagnosis not present

## 2019-11-16 DIAGNOSIS — M533 Sacrococcygeal disorders, not elsewhere classified: Secondary | ICD-10-CM | POA: Diagnosis not present

## 2019-11-16 NOTE — ED Triage Notes (Signed)
Pt states he was jumping/fell 7/4-pain to left ankle and coccyx-NAD-slow gait

## 2019-11-18 ENCOUNTER — Telehealth: Payer: Self-pay

## 2019-11-18 NOTE — Telephone Encounter (Signed)
Appointment schedule with patient 

## 2019-11-18 NOTE — Telephone Encounter (Signed)
Needs ED f/u appt w/ Dr. Drue Novel please.

## 2019-11-22 ENCOUNTER — Other Ambulatory Visit: Payer: Self-pay

## 2019-11-22 ENCOUNTER — Ambulatory Visit: Payer: 59 | Admitting: Internal Medicine

## 2019-11-22 ENCOUNTER — Encounter: Payer: Self-pay | Admitting: Internal Medicine

## 2019-11-22 VITALS — BP 115/77 | HR 69 | Temp 98.3°F | Resp 18 | Ht 65.0 in | Wt 172.1 lb

## 2019-11-22 DIAGNOSIS — S92135D Nondisplaced fracture of posterior process of left talus, subsequent encounter for fracture with routine healing: Secondary | ICD-10-CM

## 2019-11-22 DIAGNOSIS — W19XXXD Unspecified fall, subsequent encounter: Secondary | ICD-10-CM

## 2019-11-22 NOTE — Progress Notes (Signed)
Subjective:    Patient ID: Shawn Walter, male    DOB: 09-25-1970, 49 y.o.   MRN: [id number]  DOS:  11/22/2019 Type of visit - description: ER follow-up Went to the ER 11/16/2019, had a fall 3 days prior. The fall was mechanical after he jumped while hiking. X-rays: Sacrum coccyx and left ankle: Negative except for a cortical irregularity at the posterior process of the talus, could represent a nondisplaced fracture, small ankle joint effusion  At this point, coccyx pain is slightly better. Swelling of the left ankle has decreased but still has pain with certain movements and walking.  Review of Systems See above   Past Medical History:  Diagnosis Date  . Diabetes mellitus   . Hyperlipidemia   . Normal cardiac stress test 02-2011   . Renal cyst, right    Complex, Dr. Louis Meckel  . Urolithiasis 11-2013   L    Past Surgical History:  Procedure Laterality Date  . INCISE AND DRAIN ABCESS  07/11/11   perirectal abscess     Allergies as of 11/22/2019   No Known Allergies     Medication List       Accurate as of November 22, 2019 11:59 PM. If you have any questions, ask your nurse or doctor.        STOP taking these medications   meloxicam 15 MG tablet Commonly known as: MOBIC Stopped by: Kathlene November, MD   meloxicam 7.5 MG tablet Commonly known as: MOBIC Stopped by: Kathlene November, MD     TAKE these medications   Accu-Chek FastClix Lancet Kit Use as instructed to check blood sugar daily.   Accu-Chek FastClix Lancets Misc Use as instructed to check blood sugar daily.   Farxiga 10 MG Tabs tablet Generic drug: dapagliflozin propanediol TAKE 1 TABLET BY MOUTH DAILY.   FreeStyle Freedom Lite w/Device Kit Use as instructed to check blood sugar once daily.   glucose blood test strip Commonly known as: FREESTYLE LITE 1 each by Other route daily. Use freestyle lite test strip as instructed to check blood sugar once daily.   Janumet 50-1000 MG tablet Generic drug:  sitaGLIPtin-metformin TAKE 1 TABLET BY MOUTH 2 TIMES DAILY.   pioglitazone 15 MG tablet Commonly known as: ACTOS Take 1 tablet (15 mg total) by mouth daily.   rosuvastatin 20 MG tablet Commonly known as: CRESTOR TAKE 1 TABLET (20 MG TOTAL) BY MOUTH DAILY.          Objective:   Physical Exam Musculoskeletal:       Back:       Feet:    BP 115/77 (BP Location: Left Arm, Patient Position: Sitting, Cuff Size: Small)   Pulse 69   Temp 98.3 F (36.8 C) (Oral)   Resp 18   Ht _0  (1.651 m)   Wt 172 lb 2 oz (78.1 kg)   SpO2 98%   BMI 28.64 kg/m  General:   Well developed, NAD, BMI noted. HEENT:  Normocephalic . Face symmetric, atraumatic MSK: Right foot and ankle normal Left foot and ankle: Mild periankle swelling, no deformities, see graphic. Skin: Not pale. Not jaundice Neurologic:  alert & oriented X3.  Speech normal, gait appropriate for age and unassisted Psych--  Cognition and judgment appear intact.  Cooperative with normal attention span and concentration.  Behavior appropriate. No anxious or depressed appearing.      Assessment     Assessment DM: Dr Dwyane Dee  Hyperlipidemia GU: --Urolithiasis  --L--  11-2013 --Multiple renal  cysts per ultrasound 2016, MRI 10/2015:showing multiple bilateral Bosniak 1 and 2 renal cysts (no further eval per literature) --Pyelonephritis 09-2014 + FH CAD brother age 81.  Stress test (-) 2012  PLAN  Mechanical fall, lumbar/coccyx contusion, question of left ankle fracture: Recommend Tylenol, ibuprofen with GI precautions, ice, soft wrap left ankle, refer to sports med. See AVS,  patient and wine wife verbalized understanding    This visit occurred during the SARS-CoV-2 public health emergency.  Safety protocols were in place, including screening questions prior to the visit, additional usage of staff PPE, and extensive cleaning of exam room while observing appropriate contact time as indicated for disinfecting solutions.

## 2019-11-22 NOTE — Patient Instructions (Signed)
For pain control, take Tylenol and if needed add ibuprofen.  Try to minimize the use of ibuprofen.  See precautions below  Ice the area of pain twice a day if needed  Use a donut pillow  Brace the  Ankle  You can also use over-the-counter diclofenac gel  ====   Tylenol  500 mg OTC 2 tabs a day every 8 hours as needed for pain  IBUPROFEN (Advil or Motrin) 200 mg 2 tablets every 6 hours as needed for pain.  Always take it with food because may cause gastritis and ulcers.  If you notice nausea, stomach pain, change in the color of stools --->  Stop the medicine and let us know

## 2019-11-22 NOTE — Progress Notes (Signed)
Pre visit review using our clinic review tool, if applicable. No additional management support is needed unless otherwise documented below in the visit note. 

## 2019-11-24 NOTE — Assessment & Plan Note (Signed)
Mechanical fall, lumbar/coccyx contusion, question of left ankle fracture: Recommend Tylenol, ibuprofen with GI precautions, ice, soft wrap left ankle, refer to sports med. See AVS,  patient and wine wife verbalized understanding

## 2019-12-08 ENCOUNTER — Other Ambulatory Visit: Payer: Self-pay | Admitting: Endocrinology

## 2019-12-08 MED FILL — FARXIGA 10 MG TABLET: 10 | 90 days supply | Qty: 90 | Fill #0

## 2019-12-08 MED FILL — PIOGLITAZONE HCL 15 MG TAB: 15 | 90 days supply | Qty: 90 | Fill #0

## 2019-12-08 MED FILL — JANUMET 50-1,000 MG TABLET: 50-1000 | 30 days supply | Qty: 60 | Fill #1

## 2019-12-12 ENCOUNTER — Encounter: Payer: Self-pay | Admitting: Family Medicine

## 2019-12-12 ENCOUNTER — Other Ambulatory Visit: Payer: Self-pay

## 2019-12-12 ENCOUNTER — Ambulatory Visit: Payer: Self-pay

## 2019-12-12 ENCOUNTER — Ambulatory Visit (INDEPENDENT_AMBULATORY_CARE_PROVIDER_SITE_OTHER): Payer: 59 | Admitting: Family Medicine

## 2019-12-12 VITALS — BP 118/80 | HR 73 | Ht 65.0 in | Wt 170.0 lb

## 2019-12-12 DIAGNOSIS — S92009A Unspecified fracture of unspecified calcaneus, initial encounter for closed fracture: Secondary | ICD-10-CM | POA: Insufficient documentation

## 2019-12-12 DIAGNOSIS — M25572 Pain in left ankle and joints of left foot: Secondary | ICD-10-CM

## 2019-12-12 DIAGNOSIS — S92215A Nondisplaced fracture of cuboid bone of left foot, initial encounter for closed fracture: Secondary | ICD-10-CM | POA: Diagnosis not present

## 2019-12-12 NOTE — Patient Instructions (Signed)
Nice to meet you Please try ice  Please elevate the foot  Please try the CAM walker   Please send me a message in MyChart with any questions or updates.  We will setup the virtual visit once the MRI is resulted .   --Dr. Jordan Likes

## 2019-12-12 NOTE — Progress Notes (Signed)
THEDORE Walter - 49 y.o. male MRN [id number]  Date of birth: [birth year]  SUBJECTIVE:  Including CC & ROS.  Chief Complaint  Patient presents with  . Ankle Injury    left x 11/13/2019    Shawn Walter is a 49 y.o. male that is presenting with left ankle pain.  He sustained an injury on July 4.  He had an inversion injury at that time.  He has continued having pain over the lateral aspect of his foot.  Pain is worse with ambulation and does get swelling from time to time.  Has tried medications with no relief.  No history of similar pain or surgery..  Independent review of the left ankle x-ray from 7/7 shows possible fracture of the posterior talus.   Review of Systems See HPI   HISTORY: Past Medical, Surgical, Social, and Family History Reviewed & Updated per EMR.   Pertinent Historical Findings include:  Past Medical History:  Diagnosis Date  . Diabetes mellitus   . Hyperlipidemia   . Normal cardiac stress test 02-2011   . Renal cyst, right    Complex, Dr. Marlou Porch  . Urolithiasis 11-2013   L    Past Surgical History:  Procedure Laterality Date  . INCISE AND DRAIN ABCESS  07/11/11   perirectal abscess     Family History  Problem Relation Age of Onset  . Breast cancer Mother   . Sudden death Brother 107       MI   . Hypertension Neg Hx   . Hyperlipidemia Neg Hx   . Heart attack Neg Hx   . Diabetes Neg Hx   . Colon cancer Neg Hx   . Prostate cancer Neg Hx     Social History   Socioeconomic History  . Marital status: Married    Spouse name: Not on file  . Number of children: 2  . Years of education: Not on file  . Highest education level: Not on file  Occupational History  . Occupation: IT   Tobacco Use  . Smoking status: Former Smoker    Quit date: 05/13/1999    Years since quitting: 20.5  . Smokeless tobacco: Never Used  Vaping Use  . Vaping Use: Never used  Substance and Sexual Activity  . Alcohol use: Yes    Comment: occ  . Drug use: No  . Sexual  activity: Not on file  Other Topics Concern  . Not on file  Social History Narrative   Married, born in [place]     children x 2  (daughter 2004, son 2010)    Social Determinants of Health   Financial Resource Strain:   . Difficulty of Paying Living Expenses:   Food Insecurity:   . Worried About Programme researcher, broadcasting/film/video in the Last Year:   . Barista in the Last Year:   Transportation Needs:   . Freight forwarder (Medical):   Marland Kitchen Lack of Transportation (Non-Medical):   Physical Activity:   . Days of Exercise per Week:   . Minutes of Exercise per Session:   Stress:   . Feeling of Stress :   Social Connections:   . Frequency of Communication with Friends and Family:   . Frequency of Social Gatherings with Friends and Family:   . Attends Religious Services:   . Active Member of Clubs or Organizations:   . Attends Banker Meetings:   Marland Kitchen Marital Status:   Intimate Partner Violence:   .  Fear of Current or Ex-Partner:   . Emotionally Abused:   Marland Kitchen Physically Abused:   . Sexually Abused:      PHYSICAL EXAM:  VS: BP 118/80   Pulse 73   Ht 5\' 5"  (1.651 m)   Wt 170 lb (77.1 kg)   BMI 28.29 kg/m  Physical Exam Gen: NAD, alert, cooperative with exam, well-appearing MSK:  Left ankle and foot: No ecchymosis or swelling. Tenderness palpation over the lateral aspect of the foot. Normal range of motion. No translation with anterior posterior drawer. No tenderness palpation over the ATFL. Neurovascularly intact  Limited ultrasound: Left foot and ankle:  Normal-appearing lateral malleolus. Normal-appearing ATFL. No effusion within the ankle joint. Normal-appearing peroneal tendons at the lateral malleolus and normal insertion of the peroneal brevis into the base of the fifth. Increased hyperemia over the cuboid to suggest an occult fracture.  Summary: Findings would suggest a nondisplaced occult fracture of the cuboid.  Ultrasound and interpretation by , MD    ASSESSMENT & PLAN:   Closed nondisplaced fracture of cuboid bone of left foot Initial injury on July 4.  Having ongoing pain over the lateral aspect.  Ultrasound was suggestive of a fracture of the cuboid over the posterior component. -Counseled on supportive care. -Placed in cam walker. -MRI to evaluate for occult fracture.

## 2019-12-12 NOTE — Assessment & Plan Note (Signed)
Initial injury on July 4.  Having ongoing pain over the lateral aspect.  Ultrasound was suggestive of a fracture of the cuboid over the posterior component. -Counseled on supportive care. -Placed in cam walker. -MRI to evaluate for occult fracture.

## 2019-12-28 ENCOUNTER — Ambulatory Visit
Admission: RE | Admit: 2019-12-28 | Discharge: 2019-12-28 | Disposition: A | Discharge status: Home / Self Care | Payer: 59 | Source: Ambulatory Visit | Attending: Family Medicine | Admitting: Family Medicine

## 2019-12-28 ENCOUNTER — Other Ambulatory Visit: Payer: Self-pay

## 2019-12-28 DIAGNOSIS — S92002A Unspecified fracture of left calcaneus, initial encounter for closed fracture: Secondary | ICD-10-CM | POA: Diagnosis not present

## 2019-12-28 DIAGNOSIS — S92215A Nondisplaced fracture of cuboid bone of left foot, initial encounter for closed fracture: Secondary | ICD-10-CM

## 2019-12-28 DIAGNOSIS — S9032XA Contusion of left foot, initial encounter: Secondary | ICD-10-CM | POA: Diagnosis not present

## 2019-12-30 ENCOUNTER — Telehealth (INDEPENDENT_AMBULATORY_CARE_PROVIDER_SITE_OTHER): Payer: 59 | Admitting: Family Medicine

## 2019-12-30 ENCOUNTER — Other Ambulatory Visit: Payer: Self-pay

## 2019-12-30 DIAGNOSIS — S92035D Nondisplaced avulsion fracture of tuberosity of left calcaneus, subsequent encounter for fracture with routine healing: Secondary | ICD-10-CM | POA: Diagnosis not present

## 2019-12-30 NOTE — Progress Notes (Signed)
Virtual Visit via Telephone Note  I connected with WYLEY HACK on 12/30/19 at  8:10 AM EDT by telephone and verified that I am speaking with the correct person using two identifiers.   I discussed the limitations, risks, security and privacy concerns of performing an evaluation and management service by telephone and the availability of in person appointments. I also discussed with the patient that there may be a patient responsible charge related to this service. The patient expressed understanding and agreed to proceed.  Patient: home  Physician: office   History of Present Illness:  Mr. Ragle is a 49 year old male that is following up after the MRI of his left foot.  This revealing for a small avulsion fracture at the corner of the calcaneus and showing contusions of the cuboid.  Reports his pain has improved and only feels it intermittently.   Observations/Objective:  Gen: NAD, alert, cooperative with exam, well-appearing   Assessment and Plan:  Avulsion fracture of calcaneus and cuboid contusion: Cuboid was demonstrating a contusion.  His pain is improved and only feels it intermittently. -Counseled on supportive care. -Counseled on using the cam walker. -Referral to physical therapy. -Follow-up in 4 weeks.  Follow Up Instructions:    I discussed the assessment and treatment plan with the patient. The patient was provided an opportunity to ask questions and all were answered. The patient agreed with the plan and demonstrated an understanding of the instructions.   The patient was advised to call back or seek an in-person evaluation if the symptoms worsen or if the condition fails to improve as anticipated.  I provided 11 minutes of non-face-to-face time during this encounter.   Clare Gandy, MD

## 2019-12-30 NOTE — Assessment & Plan Note (Signed)
Cuboid was demonstrating a contusion.  His pain is improved and only feels it intermittently. -Counseled on supportive care. -Counseled on using the cam walker. -Referral to physical therapy. -Follow-up in 4 weeks.

## 2020-01-05 DIAGNOSIS — S92215D Nondisplaced fracture of cuboid bone of left foot, subsequent encounter for fracture with routine healing: Secondary | ICD-10-CM | POA: Diagnosis not present

## 2020-01-05 DIAGNOSIS — M79672 Pain in left foot: Secondary | ICD-10-CM | POA: Diagnosis not present

## 2020-01-10 ENCOUNTER — Other Ambulatory Visit: Payer: Self-pay | Admitting: Endocrinology

## 2020-01-10 MED FILL — JANUMET 50-1,000 MG TABLET: 50-1000 | 30 days supply | Qty: 60 | Fill #0

## 2020-02-01 ENCOUNTER — Ambulatory Visit: Payer: 59 | Admitting: Endocrinology

## 2020-02-02 ENCOUNTER — Other Ambulatory Visit: Payer: 59

## 2020-02-02 ENCOUNTER — Other Ambulatory Visit (INDEPENDENT_AMBULATORY_CARE_PROVIDER_SITE_OTHER): Payer: 59

## 2020-02-02 DIAGNOSIS — E1165 Type 2 diabetes mellitus with hyperglycemia: Secondary | ICD-10-CM | POA: Diagnosis not present

## 2020-02-02 DIAGNOSIS — E785 Hyperlipidemia, unspecified: Secondary | ICD-10-CM

## 2020-02-02 LAB — COMPREHENSIVE METABOLIC PANEL
ALT: 31 U/L (ref 0–53)
AST: 30 U/L (ref 0–37)
Albumin: 4.6 g/dL (ref 3.5–5.2)
Alkaline Phosphatase: 66 U/L (ref 39–117)
BUN: 15 mg/dL (ref 6–23)
CO2: 27 mEq/L (ref 19–32)
Calcium: 9.5 mg/dL (ref 8.4–10.5)
Chloride: 102 mEq/L (ref 96–112)
Creatinine, Ser: 0.96 mg/dL (ref 0.40–1.50)
GFR: 83.22 mL/min (ref >60.00)
Glucose, Bld: 129 mg/dL — ABNORMAL HIGH (ref 70–99)
Potassium: 4.8 mEq/L (ref 3.5–5.1)
Sodium: 138 mEq/L (ref 135–145)
Total Bilirubin: 0.7 mg/dL (ref 0.2–1.2)
Total Protein: 8.7 g/dL — ABNORMAL HIGH (ref 6.0–8.3)

## 2020-02-02 LAB — MICROALBUMIN / CREATININE URINE RATIO
Creatinine,U: 17.4 mg/dL
Microalb Creat Ratio: 4 mg/g (ref 0.0–30.0)
Microalb, Ur: <0.7 mg/dL (ref 0.0–1.9)

## 2020-02-02 LAB — URINALYSIS, ROUTINE W REFLEX MICROSCOPIC
Bilirubin Urine: NEGATIVE
Hgb urine dipstick: NEGATIVE
Ketones, ur: NEGATIVE
Leukocytes,Ua: NEGATIVE
Nitrite: NEGATIVE
RBC / HPF: NONE SEEN (ref 0–?)
Specific Gravity, Urine: 1.025 (ref 1.000–1.030)
Total Protein, Urine: NEGATIVE
Urine Glucose: >=1000 — AB
Urobilinogen, UA: 0.2 (ref 0.0–1.0)
pH: 5.5 (ref 5.0–8.0)

## 2020-02-02 LAB — HEMOGLOBIN A1C: Hgb A1c MFr Bld: 7.1 % — ABNORMAL HIGH (ref 4.6–6.5)

## 2020-02-02 LAB — LIPID PANEL
Cholesterol: 166 mg/dL (ref 0–200)
HDL: 54.4 mg/dL (ref >39.00)
LDL Cholesterol: 89 mg/dL (ref 0–99)
NonHDL: 111.71
Total CHOL/HDL Ratio: 3
Triglycerides: 113 mg/dL (ref 0.0–149.0)
VLDL: 22.6 mg/dL (ref 0.0–40.0)

## 2020-02-09 ENCOUNTER — Encounter: Payer: Self-pay | Admitting: Endocrinology

## 2020-02-09 ENCOUNTER — Other Ambulatory Visit: Payer: Self-pay

## 2020-02-09 ENCOUNTER — Ambulatory Visit (INDEPENDENT_AMBULATORY_CARE_PROVIDER_SITE_OTHER): Payer: 59 | Admitting: Endocrinology

## 2020-02-09 VITALS — BP 122/80 | HR 77 | Wt 173.0 lb

## 2020-02-09 DIAGNOSIS — E785 Hyperlipidemia, unspecified: Secondary | ICD-10-CM | POA: Diagnosis not present

## 2020-02-09 DIAGNOSIS — Z23 Encounter for immunization: Secondary | ICD-10-CM | POA: Diagnosis not present

## 2020-02-09 DIAGNOSIS — E1165 Type 2 diabetes mellitus with hyperglycemia: Secondary | ICD-10-CM | POA: Diagnosis not present

## 2020-02-09 LAB — POCT GLUCOSE (DEVICE FOR HOME USE): POC Glucose: 129 mg/dl — AB (ref 70–99)

## 2020-02-09 NOTE — Progress Notes (Signed)
Shawn Walter 49 y.o.              Reason for Appointment : Followup for Type 2 Diabetes  History of Present Illness          Diagnosis: Type 2 diabetes mellitus, date of diagnosis: 2004        Past history: His glucose was about 220 at diagnosis. He thinks he was not treated with medications but told to modify his diet and start exercise. Not clear how his diabetes was controlled with this regimen He thinks about 3 years ago he was started on metformin, probably when his A1c was 8.2%. Blood sugar was somewhat better but his A1c at best was 7.1% Although he had some initial improvement with metformin initially his blood sugars have been generally higher this year especially in 1/14 At some point he was also given Amaryl 4 mg but this was stopped when he was having low blood sugars Prior to his initial consultation he had been totally noncompliant with diet, exercise and glucose monitoring. He  probably gained weight also. He was on Janumet and Actos but his glucose on the lab was 348 along with A1c of 9.5  Recent history:   Treatment regimen: Janumet 50/1000 twice a day and Actos 15 mg daily, Farxiga 80m daily  A1c is about the same at 7.1 compared to 7.2  Current problems, management and blood sugar levels:  He has not been seen in follow-up since 11/20  Is still taking 10 mg of Farxiga in the morning before breakfast as directed  He thinks that his blood sugars are recently starting to improve but previously because of lack of exercise they were going higher  Also he finds that eating rice will make his blood sugar go up after meals, highest about 180-190  Did not bring his monitor for download  His weight has maintained slightly lower level with continuing Farxiga  Lab glucose was 129 fasting      Glucose monitoring:  less than once a day       Glucometer: Accu-Chek    Blood Glucose readings as above  Previous readings:  PRE-MEAL Fasting Lunch Dinner Bedtime  Overall  Glucose range:  119-130      Mean/median:     ?   POST-MEAL PC Breakfast PC Lunch PC Dinner  Glucose range:   140  130  Mean/median:        Dietician visit: Most recent: At the time of diagnosis                  Wt Readings from Last 3 Encounters:  02/09/20 173 lb (78.5 kg)  12/12/19 170 lb (77.1 kg)  11/22/19 172 lb 2 oz (78.1 kg)    Lab Results  Component Value Date   HGBA1C 7.1 (H) 02/02/2020   HGBA1C 7.2 (H) 03/15/2019   HGBA1C 8.1 (H) 10/15/2018   Lab Results  Component Value Date   MICROALBUR <0.7 02/02/2020   LBath89 02/02/2020   CREATININE 0.96 02/02/2020   Office Visit on 02/09/2020  Component Date Value Ref Range Status   POC Glucose 02/09/2020 129* 70 - 99 mg/dl Final     Allergies as of 02/09/2020   No Known Allergies     Medication List       Accurate as of February 09, 2020 11:56 AM. If you have any questions, ask your nurse or doctor.        Accu-Chek [employer] as  instructed to check blood sugar daily.   Accu-Chek FastClix Lancets Misc Use as instructed to check blood sugar daily.   Farxiga 10 MG Tabs tablet Generic drug: dapagliflozin propanediol TAKE 1 TABLET BY MOUTH DAILY.   FreeStyle Freedom Lite w/Device Kit Use as instructed to check blood sugar once daily.   glucose blood test strip Commonly known as: FREESTYLE LITE 1 each by Other route daily. Use freestyle lite test strip as instructed to check blood sugar once daily.   Janumet 50-1000 MG tablet Generic drug: sitaGLIPtin-metformin TAKE 1 TABLET BY MOUTH TWICE DAILY   pioglitazone 15 MG tablet Commonly known as: ACTOS TAKE 1 TABLET BY MOUTH ONCE A DAY   rosuvastatin 20 MG tablet Commonly known as: CRESTOR TAKE 1 TABLET (20 MG TOTAL) BY MOUTH DAILY.         Allergies: No Known Allergies  Past Medical History:  Diagnosis Date   Diabetes mellitus    Hyperlipidemia    Normal cardiac stress test 02-2011    Renal cyst, right     Complex, Dr. Louis Meckel   Urolithiasis 11-2013   L    Past Surgical History:  Procedure Laterality Date   INCISE AND DRAIN ABCESS  07/11/11   perirectal abscess     Family History  Problem Relation Age of Onset   Breast cancer Mother    Sudden death Brother 92       MI    Hypertension Neg Hx    Hyperlipidemia Neg Hx    Heart attack Neg Hx    Diabetes Neg Hx    Colon cancer Neg Hx    Prostate cancer Neg Hx     Social History:  reports that he quit smoking about 20 years ago. He has never used smokeless tobacco. He reports current alcohol use. He reports that he does not use drugs.    Review of Systems   No history of hypertension       Lipids: He has been treated with Crestor 20 mg by PCP Previous LDL as high as 114  Although he has taken his rosuvastatin regularly his LDL is relatively higher Triglycerides have been usually controlled along with HDL He now says that he is regularly consuming coconut oil, this is partly for him to have more satiety  Positive history of sudden death in his family with his brother dying at the age of 65     Lab Results  Component Value Date   CHOL 166 02/02/2020   CHOL 136 10/15/2018   CHOL 163 10/22/2017   Lab Results  Component Value Date   HDL 54.40 02/02/2020   HDL 47.90 10/15/2018   HDL 48.30 10/22/2017   Lab Results  Component Value Date   LDLCALC 89 02/02/2020   LDLCALC 73 10/15/2018   [place] 93 10/22/2017   Lab Results  Component Value Date   TRIG 113.0 02/02/2020   TRIG 77.0 10/15/2018   TRIG 109.0 10/22/2017   Lab Results  Component Value Date   CHOLHDL 3 02/02/2020   CHOLHDL 3 10/15/2018   CHOLHDL 3 10/22/2017   Lab Results  Component Value Date   LDLDIRECT 178.1 04/20/2013   LDLDIRECT 173.6 05/14/2012   LDLDIRECT 119.7 02/27/2011      LABS:  Office Visit on 02/09/2020  Component Date Value Ref Range Status   POC Glucose 02/09/2020 129* 70 - 99 mg/dl Final    Physical  Examination:  BP 122/80    Pulse 77    Wt 173 lb (  78.5 kg)    SpO2 97%    BMI 28.79 kg/m      ASSESSMENT:  Diabetes type 2 with mild obesity  See history of present illness for discussion of current diabetes management, blood sugar patterns and problems identified  His A1c is still fairly good at 7.1  However he has had some inconsistency with his diet and exercise as above He thinks his blood sugars are much better when he is exercising regularly and moderating portions of rice Although he may benefit from a higher dose of Actos he wants to wait until his next visit to change medications  Reminded him to check readings after meals consistently and bring monitor for download on the next visit Needs to be walking on a treadmill or other exercise, target 10 miles a week  Discussed lipid results and need to cut back on saturated fats especially with products like coconut oil, palm oil with his diet and snacks He can use other sources of fats such as avocado and nuts  Microalbumin normal   There are no Patient Instructions on file for this visit.  Influenza vaccine given  Elayne Snare 02/09/2020, 11:56 AM

## 2020-02-13 ENCOUNTER — Other Ambulatory Visit: Payer: Self-pay | Admitting: Endocrinology

## 2020-02-13 MED FILL — JANUMET 50-1,000 MG TABLET: 50-1000 | 30 days supply | Qty: 60 | Fill #0

## 2020-02-21 ENCOUNTER — Other Ambulatory Visit: Payer: Self-pay | Admitting: Endocrinology

## 2020-02-21 MED FILL — ROSUVASTATIN CALCIUM 20 MG: 20 | 90 days supply | Qty: 90 | Fill #0

## 2020-03-07 ENCOUNTER — Other Ambulatory Visit: Payer: Self-pay | Admitting: Endocrinology

## 2020-03-07 MED FILL — PIOGLITAZONE HCL 15 MG TABS: 15 | 90 days supply | Qty: 90 | Fill #0

## 2020-03-13 ENCOUNTER — Other Ambulatory Visit: Payer: Self-pay | Admitting: Endocrinology

## 2020-03-13 MED FILL — FARXIGA 10 MG TABLET: 10 | 90 days supply | Qty: 90 | Fill #0

## 2020-03-15 ENCOUNTER — Other Ambulatory Visit: Payer: Self-pay | Admitting: Endocrinology

## 2020-03-15 MED FILL — JANUMET 50-1,000 MG TABLET: 50-1000 | 30 days supply | Qty: 60 | Fill #0

## 2020-04-16 ENCOUNTER — Other Ambulatory Visit: Payer: Self-pay | Admitting: Endocrinology

## 2020-04-18 ENCOUNTER — Other Ambulatory Visit: Payer: Self-pay | Admitting: Endocrinology

## 2020-04-18 MED FILL — JANUMET 50-1,000 MG TABLET: 50-1000 | 30 days supply | Qty: 60 | Fill #0

## 2020-05-07 ENCOUNTER — Other Ambulatory Visit: Payer: 59

## 2020-05-10 ENCOUNTER — Ambulatory Visit: Payer: 59 | Admitting: Endocrinology

## 2020-05-15 ENCOUNTER — Other Ambulatory Visit: Payer: 59

## 2020-05-18 ENCOUNTER — Ambulatory Visit: Payer: 59 | Admitting: Endocrinology

## 2020-05-21 ENCOUNTER — Other Ambulatory Visit: Payer: Self-pay | Admitting: Endocrinology

## 2020-05-21 MED FILL — JANUMET 50-1,000 MG TABLET: 50-1000 | 30 days supply | Qty: 60 | Fill #0

## 2020-05-29 ENCOUNTER — Other Ambulatory Visit: Payer: Self-pay | Admitting: Endocrinology

## 2020-05-29 MED FILL — ROSUVASTATIN CALCIUM 20 MG: 20 | 90 days supply | Qty: 90 | Fill #0

## 2020-05-30 ENCOUNTER — Other Ambulatory Visit: Payer: Self-pay | Admitting: Endocrinology

## 2020-05-30 ENCOUNTER — Other Ambulatory Visit: Payer: Self-pay | Admitting: *Deleted

## 2020-05-30 MED ORDER — ROSUVASTATIN CALCIUM 20 MG PO TABS
20.0000 mg | ORAL_TABLET | Freq: Every day | ORAL | 0 refills | Status: DC
Start: 2020-05-30 — End: 2020-05-30

## 2020-06-11 MED FILL — PIOGLITAZONE HCL 15 MG TABS: 15 | 90 days supply | Qty: 90 | Fill #0

## 2020-06-20 ENCOUNTER — Other Ambulatory Visit: Payer: Self-pay | Admitting: Endocrinology

## 2020-06-21 ENCOUNTER — Other Ambulatory Visit: Payer: Self-pay | Admitting: Internal Medicine

## 2020-06-21 MED FILL — JANUMET 50-1,000 MG TABLET: 50-1000 | 30 days supply | Qty: 60 | Fill #0

## 2020-06-21 MED FILL — FARXIGA 10 MG TABLET: 10 | 90 days supply | Qty: 90 | Fill #0

## 2020-06-21 MED FILL — FREESTYLE LITE TEST STRIP: 90 days supply | Qty: 100 | Fill #0

## 2020-07-04 ENCOUNTER — Other Ambulatory Visit (INDEPENDENT_AMBULATORY_CARE_PROVIDER_SITE_OTHER): Payer: 59

## 2020-07-04 ENCOUNTER — Other Ambulatory Visit: Payer: Self-pay

## 2020-07-04 DIAGNOSIS — E1165 Type 2 diabetes mellitus with hyperglycemia: Secondary | ICD-10-CM

## 2020-07-04 DIAGNOSIS — E785 Hyperlipidemia, unspecified: Secondary | ICD-10-CM | POA: Diagnosis not present

## 2020-07-04 LAB — COMPREHENSIVE METABOLIC PANEL
ALT: 34 U/L (ref 0–53)
AST: 32 U/L (ref 0–37)
Albumin: 4.2 g/dL (ref 3.5–5.2)
Alkaline Phosphatase: 57 U/L (ref 39–117)
BUN: 15 mg/dL (ref 6–23)
CO2: 26 mEq/L (ref 19–32)
Calcium: 9.4 mg/dL (ref 8.4–10.5)
Chloride: 103 mEq/L (ref 96–112)
Creatinine, Ser: 0.92 mg/dL (ref 0.40–1.50)
GFR: 97.68 mL/min (ref >60.00)
Glucose, Bld: 124 mg/dL — ABNORMAL HIGH (ref 70–99)
Potassium: 4.4 mEq/L (ref 3.5–5.1)
Sodium: 137 mEq/L (ref 135–145)
Total Bilirubin: 0.6 mg/dL (ref 0.2–1.2)
Total Protein: 7.9 g/dL (ref 6.0–8.3)

## 2020-07-04 LAB — LIPID PANEL
Cholesterol: 154 mg/dL (ref 0–200)
HDL: 48.9 mg/dL (ref >39.00)
LDL Cholesterol: 87 mg/dL (ref 0–99)
NonHDL: 105.15
Total CHOL/HDL Ratio: 3
Triglycerides: 89 mg/dL (ref 0.0–149.0)
VLDL: 17.8 mg/dL (ref 0.0–40.0)

## 2020-07-04 LAB — HEMOGLOBIN A1C: Hgb A1c MFr Bld: 7.3 % — ABNORMAL HIGH (ref 4.6–6.5)

## 2020-07-09 ENCOUNTER — Other Ambulatory Visit: Payer: Self-pay

## 2020-07-09 ENCOUNTER — Ambulatory Visit: Payer: 59 | Admitting: Endocrinology

## 2020-07-09 ENCOUNTER — Encounter: Payer: Self-pay | Admitting: Endocrinology

## 2020-07-09 ENCOUNTER — Other Ambulatory Visit: Payer: Self-pay | Admitting: Endocrinology

## 2020-07-09 VITALS — BP 116/82 | HR 72 | Ht 65.0 in | Wt 176.0 lb

## 2020-07-09 DIAGNOSIS — E1165 Type 2 diabetes mellitus with hyperglycemia: Secondary | ICD-10-CM | POA: Diagnosis not present

## 2020-07-09 DIAGNOSIS — E785 Hyperlipidemia, unspecified: Secondary | ICD-10-CM | POA: Diagnosis not present

## 2020-07-09 MED ORDER — RYBELSUS 7 MG PO TABS: 1.0000 | ORAL_TABLET | Freq: Every day | ORAL | 3 refills | Status: DC

## 2020-07-09 MED ORDER — METFORMIN HCL 1000 MG PO TABS: 1000.0000 mg | ORAL_TABLET | Freq: Two times a day (BID) | ORAL | 3 refills | Status: DC

## 2020-07-09 MED FILL — RYBELSUS 7 MG TABS: 7 | 30 days supply | Qty: 30 | Fill #0

## 2020-07-09 MED FILL — METFORMIN HCL 1000 MG TABS: 1000 | 90 days supply | Qty: 180 | Fill #0

## 2020-07-09 NOTE — Patient Instructions (Addendum)
Rybelsus improves blood sugar control as well as can help with weight loss and reduces cardiovascular events. Need to take the Rx on empty stomach 30 minutes before breakfast with 4 ounces of water daily.  You may feel more fullness at mealtimes and try to keep portions at meals smaller  Some people may have nausea or even vomiting that may occur in the first few days; usually the symptoms go away with time. Please call if nausea or vomiting does not improve within 2 weeks  With Rybelsus stop Janumet BUT START METFORMIN   Check blood sugars on waking up 3 days a week  Also check blood sugars about 2 hours after meals and do this after different meals by rotation  Recommended blood sugar levels on waking up are 90-130 and about 2 hours after meal is 130-160  Please bring your blood sugar monitor to each visit, thank you  eXERCISE DAILY

## 2020-07-09 NOTE — Progress Notes (Signed)
Shawn Walter 50 y.o.              Reason for Appointment : Followup for Type 2 Diabetes  History of Present Illness          Diagnosis: Type 2 diabetes mellitus, date of diagnosis: 2004        Past history: His glucose was about 220 at diagnosis. He thinks he was not treated with medications but told to modify his diet and start exercise. Not clear how his diabetes was controlled with this regimen He thinks about 3 years ago he was started on metformin, probably when his A1c was 8.2%. Blood sugar was somewhat better but his A1c at best was 7.1% Although he had some initial improvement with metformin initially his blood sugars have been generally higher this year especially in 1/14 At some point he was also given Amaryl 4 mg but this was stopped when he was having low blood sugars Prior to his initial consultation he had been totally noncompliant with diet, exercise and glucose monitoring. He  probably gained weight also. He was on Janumet and Actos but his glucose on the lab was 348 along with A1c of 9.5  Recent history:   Treatment regimen: Janumet 50/1000 twice a day and Actos 15 mg daily, Farxiga 80m daily  A1c is 7.3, was 7.1  Current problems, management and blood sugar levels:  His A1c has been above 7% since 2018  Is still not able to get consistent diet and exercise regimen or lose weight  Weight is gradually going up 3 pounds on each visit  He thinks that if he is going off his diet with more carbohydrates or sweets his blood sugar may be over 200 but otherwise usually under 170  However fasting readings may be relatively higher compared to before  He thinks he is taking all his medication regularly and no side effects from [facility] Lab glucose was 124 fasting     Glucose monitoring:  less than once a day       Glucometer: Accu-Chek    Blood Glucose readings by recall:   PRE-MEAL Fasting Lunch Dinner Bedtime Overall  Glucose range:  120-140       Mean/median:     ?   POST-MEAL PC Breakfast PC Lunch PC Dinner  Glucose range:   <170  <220  Mean/median:        Dietician visit: Most recent: At the time of diagnosis                  Wt Readings from Last 3 Encounters:  07/09/20 176 lb (79.8 kg)  02/09/20 173 lb (78.5 kg)  12/12/19 170 lb (77.1 kg)    Lab Results  Component Value Date   HGBA1C 7.3 (H) 07/04/2020   HGBA1C 7.1 (H) 02/02/2020   HGBA1C 7.2 (H) 03/15/2019   Lab Results  Component Value Date   MICROALBUR <0.7 02/02/2020   LDLCALC 87 07/04/2020   CREATININE 0.92 07/04/2020   Lab on 07/04/2020  Component Date Value Ref Range Status  . Cholesterol 07/04/2020 154  0 - 200 mg/dL Final   ATP III Classification       Desirable:  < 200 mg/dL               Borderline High:  200 - 239 mg/dL          High:  > = 240 mg/dL  . Triglycerides 07/04/2020 89.0  0.0 - 149.0 mg/dL Final  Normal:  <150 mg/dLBorderline High:  150 - 199 mg/dL  . HDL 07/04/2020 48.90  >39.00 mg/dL Final  . VLDL 07/04/2020 17.8  0.0 - 40.0 mg/dL Final  . LDL Cholesterol 07/04/2020 87  0 - 99 mg/dL Final  . Total CHOL/HDL Ratio 07/04/2020 3   Final                  Men          Women1/2 Average Risk     3.4          3.3Average Risk          5.0          4.42X Average Risk          9.6          7.13X Average Risk          15.0          11.0                      . NonHDL 07/04/2020 105.15   Final   NOTE:  Non-HDL goal should be 30 mg/dL higher than patient's LDL goal (i.e. LDL goal of < 70 mg/dL, would have non-HDL goal of < 100 mg/dL)  . Sodium 07/04/2020 137  135 - 145 mEq/L Final  . Potassium 07/04/2020 4.4  3.5 - 5.1 mEq/L Final  . Chloride 07/04/2020 103  96 - 112 mEq/L Final  . CO2 07/04/2020 26  19 - 32 mEq/L Final  . Glucose, Bld 07/04/2020 124* 70 - 99 mg/dL Final  . BUN 07/04/2020 15  6 - 23 mg/dL Final  . Creatinine, Ser 07/04/2020 0.92  0.40 - 1.50 mg/dL Final  . Total Bilirubin 07/04/2020 0.6  0.2 - 1.2 mg/dL Final  . Alkaline  Phosphatase 07/04/2020 57  39 - 117 U/L Final  . AST 07/04/2020 32  0 - 37 U/L Final  . ALT 07/04/2020 34  0 - 53 U/L Final  . Total Protein 07/04/2020 7.9  6.0 - 8.3 g/dL Final  . Albumin 07/04/2020 4.2  3.5 - 5.2 g/dL Final  . GFR 07/04/2020 97.68  >60.00 mL/min Final   Calculated using the CKD-EPI Creatinine Equation (2021)  . Calcium 07/04/2020 9.4  8.4 - 10.5 mg/dL Final  . Hgb A1c MFr Bld 07/04/2020 7.3* 4.6 - 6.5 % Final   Glycemic Control Guidelines for People with Diabetes:Non Diabetic:  <6%Goal of Therapy: <7%Additional Action Suggested:  >8%      Allergies as of 07/09/2020   No Known Allergies     Medication List       Accurate as of July 09, 2020  9:13 AM. If you have any questions, ask your nurse or doctor.        Accu-Chek [employer] Kit Use as instructed to check blood sugar daily.   Accu-Chek FastClix Lancets Misc Use as instructed to check blood sugar daily.   Farxiga 10 MG Tabs tablet Generic drug: dapagliflozin propanediol TAKE 1 TABLET BY MOUTH DAILY.   FreeStyle Freedom Lite w/Device Kit Use as instructed to check blood sugar once daily.   FREESTYLE LITE test strip Generic drug: glucose blood USE 1 FREESTYLE LITE TEST STRIP AS INSTRUCTED TO CHECK BLOOD SUGAR ONCE DAILY.   Janumet 50-1000 MG tablet Generic drug: sitaGLIPtin-metformin TAKE 1 TABLET BY MOUTH 2 TIMES DAILY   pioglitazone 15 MG tablet Commonly known as: ACTOS TAKE 1 TABLET BY MOUTH ONCE A DAY  rosuvastatin 20 MG tablet Commonly known as: CRESTOR Take 1 tablet (20 mg total) by mouth daily.         Allergies: No Known Allergies  Past Medical History:  Diagnosis Date  . Diabetes mellitus   . Hyperlipidemia   . Normal cardiac stress test 02-2011   . Renal cyst, right    Complex, Dr. Louis Meckel  . Urolithiasis 11-2013   L    Past Surgical History:  Procedure Laterality Date  . INCISE AND DRAIN ABCESS  07/11/11   perirectal abscess     Family History  Problem  Relation Age of Onset  . Breast cancer Mother   . Sudden death Brother 69       MI   . Hypertension Neg Hx   . Hyperlipidemia Neg Hx   . Heart attack Neg Hx   . Diabetes Neg Hx   . Colon cancer Neg Hx   . Prostate cancer Neg Hx     Social History:  reports that he quit smoking about 21 years ago. He has never used smokeless tobacco. He reports current alcohol use. He reports that he does not use drugs.    Review of Systems   No history of hypertension       Lipids: He has been treated with Crestor 20 mg by PCP Previous LDL as high as 114  Although he has taken his rosuvastatin regularly his LDL is relatively higher Triglycerides have been usually controlled along with HDL He has been asked to cut back on saturated fats such as coconut oil  Positive history of sudden death in his family with his brother dying at the age of 71    Lab Results  Component Value Date   CHOL 154 07/04/2020   CHOL 166 02/02/2020   CHOL 136 10/15/2018   Lab Results  Component Value Date   HDL 48.90 07/04/2020   HDL 54.40 02/02/2020   HDL 47.90 10/15/2018   Lab Results  Component Value Date   LDLCALC 87 07/04/2020   LDLCALC 89 02/02/2020   LDLCALC 73 10/15/2018   Lab Results  Component Value Date   TRIG 89.0 07/04/2020   TRIG 113.0 02/02/2020   TRIG 77.0 10/15/2018   Lab Results  Component Value Date   CHOLHDL 3 07/04/2020   CHOLHDL 3 02/02/2020   CHOLHDL 3 10/15/2018   Lab Results  Component Value Date   LDLDIRECT 178.1 04/20/2013   LDLDIRECT 173.6 05/14/2012   LDLDIRECT 119.7 02/27/2011      LABS:  Lab on 07/04/2020  Component Date Value Ref Range Status  . Cholesterol 07/04/2020 154  0 - 200 mg/dL Final   ATP III Classification       Desirable:  < 200 mg/dL               Borderline High:  200 - 239 mg/dL          High:  > = 240 mg/dL  . Triglycerides 07/04/2020 89.0  0.0 - 149.0 mg/dL Final   Normal:  <150 mg/dLBorderline High:  150 - 199 mg/dL  . HDL 07/04/2020  48.90  >39.00 mg/dL Final  . VLDL 07/04/2020 17.8  0.0 - 40.0 mg/dL Final  . LDL Cholesterol 07/04/2020 87  0 - 99 mg/dL Final  . Total CHOL/HDL Ratio 07/04/2020 3   Final                  Men          Women1/2  Average Risk     3.4          3.3Average Risk          5.0          4.42X Average Risk          9.6          7.13X Average Risk          15.0          11.0                      . NonHDL 07/04/2020 105.15   Final   NOTE:  Non-HDL goal should be 30 mg/dL higher than patient's LDL goal (i.e. LDL goal of < 70 mg/dL, would have non-HDL goal of < 100 mg/dL)  . Sodium 07/04/2020 137  135 - 145 mEq/L Final  . Potassium 07/04/2020 4.4  3.5 - 5.1 mEq/L Final  . Chloride 07/04/2020 103  96 - 112 mEq/L Final  . CO2 07/04/2020 26  19 - 32 mEq/L Final  . Glucose, Bld 07/04/2020 124* 70 - 99 mg/dL Final  . BUN 07/04/2020 15  6 - 23 mg/dL Final  . Creatinine, Ser 07/04/2020 0.92  0.40 - 1.50 mg/dL Final  . Total Bilirubin 07/04/2020 0.6  0.2 - 1.2 mg/dL Final  . Alkaline Phosphatase 07/04/2020 57  39 - 117 U/L Final  . AST 07/04/2020 32  0 - 37 U/L Final  . ALT 07/04/2020 34  0 - 53 U/L Final  . Total Protein 07/04/2020 7.9  6.0 - 8.3 g/dL Final  . Albumin 07/04/2020 4.2  3.5 - 5.2 g/dL Final  . GFR 07/04/2020 97.68  >60.00 mL/min Final   Calculated using the CKD-EPI Creatinine Equation (2021)  . Calcium 07/04/2020 9.4  8.4 - 10.5 mg/dL Final  . Hgb A1c MFr Bld 07/04/2020 7.3* 4.6 - 6.5 % Final   Glycemic Control Guidelines for People with Diabetes:Non Diabetic:  <6%Goal of Therapy: <7%Additional Action Suggested:  >8%     Physical Examination:  BP 116/82   Pulse 72   Ht '5\' 5"'  (1.651 m)   Wt 176 lb (79.8 kg)   SpO2 99%   BMI 29.29 kg/m      ASSESSMENT:  Diabetes type 2 with mild obesity  See history of present illness for discussion of current diabetes management, blood sugar patterns and problems identified  His A1c is consistently over 7%  He is on a 4 drug regimen of Januvia,  Metformin, Actos and [place]  Although he thinks he can improve his control with consistent diet and improved diet his A1c has not been under 7% for about 4 years now Also given the duration of his diabetes he likely may begin some progression and insulin deficiency Currently on Januvia and needs to go on to a GLP-1 drug  Discussed with the patient the nature of GLP-1 drugs, the actions on insulin secretion, slowing stomach emptying, reduction of appetite and reduced liver glucose production Explained that Rybelsus improves blood sugar control as well as produces weight loss and reduces cardiovascular events. Explained possible side effects especially nausea and vomiting that may occur in the first few days; usually side effects improve with time.  Patient to call if nausea or vomiting does not improve within 2 weeks Instructed to take the tablets on empty stomach 30 minutes before breakfast with 4 ounces of water daily.   Patient education material, 30-day trial sample given  After  30 days he will go to 7 mg Rybelsus Stop Janumet and switch to Metformin 1 g twice daily If blood sugars are significantly better may leave off Actos  He will try to start walking on a treadmill or other walking exercises regularly, preferably 5 to 6 days a week Modified diet with balancing protein and carbohydrate  Continue rosuvastatin for hyperlipidemia  More regular follow-up   There are no Patient Instructions on file for this visit.    Elayne Snare 07/09/2020, 9:13 AM

## 2020-07-19 ENCOUNTER — Encounter: Payer: Self-pay | Admitting: Internal Medicine

## 2020-08-03 ENCOUNTER — Other Ambulatory Visit (HOSPITAL_BASED_OUTPATIENT_CLINIC_OR_DEPARTMENT_OTHER): Payer: Self-pay

## 2020-08-31 ENCOUNTER — Other Ambulatory Visit (HOSPITAL_COMMUNITY): Payer: Self-pay

## 2020-08-31 MED FILL — Rosuvastatin Calcium Tab 20 MG: ORAL | 90 days supply | Qty: 90 | Fill #0 | Status: AC

## 2020-09-03 ENCOUNTER — Other Ambulatory Visit (HOSPITAL_COMMUNITY): Payer: Self-pay

## 2020-09-10 ENCOUNTER — Other Ambulatory Visit (HOSPITAL_COMMUNITY): Payer: Self-pay

## 2020-09-10 MED FILL — Pioglitazone HCl Tab 15 MG (Base Equiv): ORAL | 90 days supply | Qty: 90 | Fill #0 | Status: AC

## 2020-09-20 ENCOUNTER — Other Ambulatory Visit (HOSPITAL_COMMUNITY): Payer: Self-pay

## 2020-09-20 MED FILL — Metformin HCl Tab 1000 MG: ORAL | 90 days supply | Qty: 180 | Fill #0 | Status: AC

## 2020-09-27 ENCOUNTER — Other Ambulatory Visit (HOSPITAL_COMMUNITY): Payer: Self-pay

## 2020-10-01 ENCOUNTER — Other Ambulatory Visit: Payer: Self-pay | Admitting: Internal Medicine

## 2020-10-01 ENCOUNTER — Other Ambulatory Visit (HOSPITAL_COMMUNITY): Payer: Self-pay

## 2020-10-01 MED ORDER — DAPAGLIFLOZIN PROPANEDIOL 10 MG PO TABS
ORAL_TABLET | Freq: Every day | ORAL | 0 refills | Status: DC
  Filled 2020-10-01: qty 90, 90d supply, fill #0

## 2020-10-02 ENCOUNTER — Other Ambulatory Visit (HOSPITAL_COMMUNITY): Payer: Self-pay

## 2020-12-11 ENCOUNTER — Other Ambulatory Visit (HOSPITAL_COMMUNITY): Payer: Self-pay

## 2020-12-11 ENCOUNTER — Other Ambulatory Visit: Payer: Self-pay | Admitting: Endocrinology

## 2020-12-11 ENCOUNTER — Telehealth: Payer: Self-pay | Admitting: Endocrinology

## 2020-12-11 DIAGNOSIS — E785 Hyperlipidemia, unspecified: Secondary | ICD-10-CM

## 2020-12-11 DIAGNOSIS — E1165 Type 2 diabetes mellitus with hyperglycemia: Secondary | ICD-10-CM

## 2020-12-11 MED ORDER — ROSUVASTATIN CALCIUM 20 MG PO TABS
ORAL_TABLET | Freq: Every day | ORAL | 0 refills | Status: DC
  Filled 2020-12-11: qty 90, 90d supply, fill #0

## 2020-12-11 MED ORDER — PIOGLITAZONE HCL 15 MG PO TABS
ORAL_TABLET | Freq: Every day | ORAL | 1 refills | Status: DC
  Filled 2020-12-11: qty 90, 90d supply, fill #0

## 2020-12-11 NOTE — Telephone Encounter (Signed)
Pt also has an app on 01/01/2021 and is wondering if he needs blood work for this app

## 2020-12-11 NOTE — Telephone Encounter (Signed)
Pt has an app on 01/01/2021 and is wondering if he can get a refill up until his app date   pioglitazone (ACTOS) 15 MG tablet  rosuvastatin (CRESTOR) 20 MG tablet  [facility]

## 2020-12-12 ENCOUNTER — Other Ambulatory Visit (HOSPITAL_COMMUNITY): Payer: Self-pay

## 2020-12-12 ENCOUNTER — Other Ambulatory Visit: Payer: Self-pay | Admitting: Endocrinology

## 2020-12-12 DIAGNOSIS — E785 Hyperlipidemia, unspecified: Secondary | ICD-10-CM

## 2020-12-12 DIAGNOSIS — E1165 Type 2 diabetes mellitus with hyperglycemia: Secondary | ICD-10-CM

## 2020-12-13 ENCOUNTER — Other Ambulatory Visit (HOSPITAL_COMMUNITY): Payer: Self-pay

## 2020-12-27 ENCOUNTER — Other Ambulatory Visit (INDEPENDENT_AMBULATORY_CARE_PROVIDER_SITE_OTHER): Payer: BC Managed Care – PPO

## 2020-12-27 ENCOUNTER — Other Ambulatory Visit: Payer: Self-pay

## 2020-12-27 DIAGNOSIS — E785 Hyperlipidemia, unspecified: Secondary | ICD-10-CM

## 2020-12-27 DIAGNOSIS — E1165 Type 2 diabetes mellitus with hyperglycemia: Secondary | ICD-10-CM

## 2020-12-27 LAB — MICROALBUMIN / CREATININE URINE RATIO
Creatinine,U: 82.6 mg/dL
Microalb Creat Ratio: 2.1 mg/g (ref 0.0–30.0)
Microalb, Ur: 1.8 mg/dL (ref 0.0–1.9)

## 2020-12-27 LAB — COMPREHENSIVE METABOLIC PANEL
ALT: 30 U/L (ref 0–53)
AST: 29 U/L (ref 0–37)
Albumin: 4.1 g/dL (ref 3.5–5.2)
Alkaline Phosphatase: 72 U/L (ref 39–117)
BUN: 15 mg/dL (ref 6–23)
CO2: 26 mEq/L (ref 19–32)
Calcium: 9.1 mg/dL (ref 8.4–10.5)
Chloride: 103 mEq/L (ref 96–112)
Creatinine, Ser: 0.96 mg/dL (ref 0.40–1.50)
GFR: 92.5 mL/min (ref >60.00)
Glucose, Bld: 159 mg/dL — ABNORMAL HIGH (ref 70–99)
Potassium: 4.8 mEq/L (ref 3.5–5.1)
Sodium: 137 mEq/L (ref 135–145)
Total Bilirubin: 0.6 mg/dL (ref 0.2–1.2)
Total Protein: 7.8 g/dL (ref 6.0–8.3)

## 2020-12-27 LAB — LIPID PANEL
Cholesterol: 147 mg/dL (ref 0–200)
HDL: 50.5 mg/dL (ref >39.00)
LDL Cholesterol: 77 mg/dL (ref 0–99)
NonHDL: 96.44
Total CHOL/HDL Ratio: 3
Triglycerides: 97 mg/dL (ref 0.0–149.0)
VLDL: 19.4 mg/dL (ref 0.0–40.0)

## 2020-12-27 LAB — HEMOGLOBIN A1C: Hgb A1c MFr Bld: 9.3 % — ABNORMAL HIGH (ref 4.6–6.5)

## 2021-01-01 ENCOUNTER — Ambulatory Visit (INDEPENDENT_AMBULATORY_CARE_PROVIDER_SITE_OTHER): Payer: BC Managed Care – PPO | Admitting: Endocrinology

## 2021-01-01 ENCOUNTER — Other Ambulatory Visit (HOSPITAL_COMMUNITY): Payer: Self-pay

## 2021-01-01 ENCOUNTER — Other Ambulatory Visit: Payer: Self-pay

## 2021-01-01 ENCOUNTER — Encounter: Payer: Self-pay | Admitting: Endocrinology

## 2021-01-01 VITALS — BP 130/78 | HR 74 | Ht 65.0 in | Wt 175.2 lb

## 2021-01-01 DIAGNOSIS — E1165 Type 2 diabetes mellitus with hyperglycemia: Secondary | ICD-10-CM

## 2021-01-01 DIAGNOSIS — E785 Hyperlipidemia, unspecified: Secondary | ICD-10-CM | POA: Diagnosis not present

## 2021-01-01 MED ORDER — PIOGLITAZONE HCL 30 MG PO TABS
30.0000 mg | ORAL_TABLET | Freq: Every day | ORAL | 0 refills | Status: DC
  Filled 2021-01-01: qty 90, 90d supply, fill #0

## 2021-01-01 MED ORDER — JANUMET XR 50-1000 MG PO TB24
ORAL_TABLET | ORAL | 1 refills | Status: DC
  Filled 2021-01-01: qty 60, 30d supply, fill #0
  Filled 2021-02-01: qty 60, 30d supply, fill #1

## 2021-01-01 NOTE — Progress Notes (Signed)
Shawn Walter 50 y.o.              Reason for Appointment : Followup for Type 2 Diabetes  History of Present Illness          Diagnosis: Type 2 diabetes mellitus, date of diagnosis: 2004        Past history: His glucose was about 220 at diagnosis. He thinks he was not treated with medications but told to modify his diet and start exercise. Not clear how his diabetes was controlled with this regimen He thinks about 3 years ago he was started on metformin, probably when his A1c was 8.2%. Blood sugar was somewhat better but his A1c at best was 7.1% Although he had some initial improvement with metformin initially his blood sugars have been generally higher this year especially in 1/14 At some point he was also given Amaryl 4 mg but this was stopped when he was having low blood sugars Prior to his initial consultation he had been totally noncompliant with diet, exercise and glucose monitoring. He  probably gained weight also. He was on Janumet and Actos but his glucose on the lab was 348 along with A1c of 9.5  Recent history:   Treatment regimen: Metformin 1000 twice a day and Actos 15 mg daily, Farxiga 57m daily  A1c is 9.3, was 7.3 previously  Current problems, management and blood sugar levels: His A1c has been above 7% since 2018 He was told to try Rybelsus and this was prescribed in 2/22 with 3 mg dose  However after a couple of weeks he started having abdominal distress and nausea; he says he stopped it for a few days and tried it again but had the same side effect  He did not let [facility] that he had side effects with this and has not changed his medications  Janumet was changed to metformin because of switching to Rybelsus  He is not likely checking his blood sugars much and usually checking only fasting However recently has been having excessive thirst Weight is the same He is exercising only about once a week He says he is eating rice every day and likely a couple of cups  at a time and sometimes getting sweets Lab glucose was 159 fasting, he did not bring his monitor again for download     Glucose monitoring:  less than once a day       Glucometer: Accu-Chek    Blood Glucose readings by recall:   PRE-MEAL Fasting Lunch Dinner Bedtime Overall  Glucose range: 150-1 164      Mean/median:        POST-MEAL PC Breakfast PC Lunch PC Dinner  Glucose range:   ?  Mean/median:       Previously: PRE-MEAL Fasting Lunch Dinner Bedtime Overall  Glucose range:  120-140      Mean/median:     ?   POST-MEAL PC Breakfast PC Lunch PC Dinner  Glucose range:   <170  <220  Mean/median:        Dietician visit: Most recent: At the time of diagnosis                  Wt Readings from Last 3 Encounters:  01/01/21 175 lb 3.2 oz (79.5 kg)  07/09/20 176 lb (79.8 kg)  02/09/20 173 lb (78.5 kg)    Lab Results  Component Value Date   HGBA1C 9.3 (H) 12/27/2020   HGBA1C 7.3 (H) 07/04/2020   HGBA1C 7.1 (H)  02/02/2020   Lab Results  Component Value Date   MICROALBUR 1.8 12/27/2020   LDLCALC 77 12/27/2020   CREATININE 0.96 12/27/2020   Lab on 12/27/2020  Component Date Value Ref Range Status   Cholesterol 12/27/2020 147  0 - 200 mg/dL Final   ATP III Classification       Desirable:  < 200 mg/dL               Borderline High:  200 - 239 mg/dL          High:  > = 240 mg/dL   Triglycerides 12/27/2020 97.0  0.0 - 149.0 mg/dL Final   Normal:  <150 mg/dLBorderline High:  150 - 199 mg/dL   HDL 12/27/2020 50.50  >39.00 mg/dL Final   VLDL 12/27/2020 19.4  0.0 - 40.0 mg/dL Final   LDL Cholesterol 12/27/2020 77  0 - 99 mg/dL Final   Total CHOL/HDL Ratio 12/27/2020 3   Final                  Men          Women1/2 Average Risk     3.4          3.3Average Risk          5.0          4.42X Average Risk          9.6          7.13X Average Risk          15.0          11.0                       NonHDL 12/27/2020 96.44   Final   NOTE:  Non-HDL goal should be 30 mg/dL higher than  patient's LDL goal (i.e. LDL goal of < 70 mg/dL, would have non-HDL goal of < 100 mg/dL)   Microalb, Ur 12/27/2020 1.8  0.0 - 1.9 mg/dL Final   Creatinine,U 12/27/2020 82.6  mg/dL Final   Microalb Creat Ratio 12/27/2020 2.1  0.0 - 30.0 mg/g Final   Sodium 12/27/2020 137  135 - 145 mEq/L Final   Potassium 12/27/2020 4.8  3.5 - 5.1 mEq/L Final   Chloride 12/27/2020 103  96 - 112 mEq/L Final   CO2 12/27/2020 26  19 - 32 mEq/L Final   Glucose, Bld 12/27/2020 159 (A) 70 - 99 mg/dL Final   BUN 12/27/2020 15  6 - 23 mg/dL Final   Creatinine, Ser 12/27/2020 0.96  0.40 - 1.50 mg/dL Final   Total Bilirubin 12/27/2020 0.6  0.2 - 1.2 mg/dL Final   Alkaline Phosphatase 12/27/2020 72  39 - 117 U/L Final   AST 12/27/2020 29  0 - 37 U/L Final   ALT 12/27/2020 30  0 - 53 U/L Final   Total Protein 12/27/2020 7.8  6.0 - 8.3 g/dL Final   Albumin 12/27/2020 4.1  3.5 - 5.2 g/dL Final   GFR 12/27/2020 92.50  >60.00 mL/min Final   Calculated using the CKD-EPI Creatinine Equation (2021)   Calcium 12/27/2020 9.1  8.4 - 10.5 mg/dL Final   Hgb A1c MFr Bld 12/27/2020 9.3 (A) 4.6 - 6.5 % Final   Glycemic Control Guidelines for People with Diabetes:Non Diabetic:  <6%Goal of Therapy: <7%Additional Action Suggested:  >8%      Allergies as of 01/01/2021   No Known Allergies      Medication List  Accurate as of January 01, 2021  9:15 AM. If you have any questions, ask your nurse or doctor.          Accu-Chek [employer] Kit Use as instructed to check blood sugar daily.   Accu-Chek FastClix Lancets Misc Use as instructed to check blood sugar daily.   Farxiga 10 MG Tabs tablet Generic drug: dapagliflozin propanediol TAKE 1 TABLET BY MOUTH DAILY.   FreeStyle Freedom Lite w/Device Kit Use as instructed to check blood sugar once daily.   FREESTYLE LITE test strip Generic drug: glucose blood USE 1 FREESTYLE LITE TEST STRIP AS INSTRUCTED TO CHECK BLOOD SUGAR ONCE DAILY.   metFORMIN 1000 MG  tablet Commonly known as: GLUCOPHAGE TAKE 1 TABLET BY MOUTH 2 TIMES DAILY WITH A MEAL - REPLACES JANUMET   pioglitazone 15 MG tablet Commonly known as: ACTOS TAKE 1 TABLET BY MOUTH ONCE A DAY   rosuvastatin 20 MG tablet Commonly known as: CRESTOR TAKE 1 TABLET BY MOUTH ONCE A DAY   Rybelsus 7 MG Tabs Generic drug: Semaglutide TAKE 1 TABLET BY MOUTH DAILY, 30 MINUTES BEFORE BREAKFAST, WITH WATER. START AFTER 3MG SAMPLES.          Allergies: No Known Allergies  Past Medical History:  Diagnosis Date   Diabetes mellitus    Hyperlipidemia    Normal cardiac stress test 02-2011    Renal cyst, right    Complex, Dr. Louis Meckel   Urolithiasis 11-2013   L    Past Surgical History:  Procedure Laterality Date   INCISE AND DRAIN ABCESS  07/11/11   perirectal abscess     Family History  Problem Relation Age of Onset   Breast cancer Mother    Sudden death Brother 65       MI    Hypertension Neg Hx    Hyperlipidemia Neg Hx    Heart attack Neg Hx    Diabetes Neg Hx    Colon cancer Neg Hx    Prostate cancer Neg Hx     Social History:  reports that he quit smoking about 21 years ago. His smoking use included cigarettes. He has never used smokeless tobacco. He reports current alcohol use. He reports that he does not use drugs.    Review of Systems   No history of hypertension       Lipids: He has been treated with Crestor 20 mg by PCP Previous LDL as high as 114  Although he has taken his rosuvastatin regularly his LDL is relatively higher Triglycerides have been controlled along with HDL He has been asked to cut back on saturated fats such as coconut oil  Has a positive history of sudden death in his family with his brother dying at the age of 30    Lab Results  Component Value Date   CHOL 147 12/27/2020   CHOL 154 07/04/2020   CHOL 166 02/02/2020   Lab Results  Component Value Date   HDL 50.50 12/27/2020   HDL 48.90 07/04/2020   HDL 54.40 02/02/2020   Lab  Results  Component Value Date   LDLCALC 77 12/27/2020   LDLCALC 87 07/04/2020   LDLCALC 89 02/02/2020   Lab Results  Component Value Date   TRIG 97.0 12/27/2020   TRIG 89.0 07/04/2020   TRIG 113.0 02/02/2020   Lab Results  Component Value Date   CHOLHDL 3 12/27/2020   CHOLHDL 3 07/04/2020   CHOLHDL 3 02/02/2020   Lab Results  Component Value Date  LDLDIRECT 178.1 04/20/2013   LDLDIRECT 173.6 05/14/2012   LDLDIRECT 119.7 02/27/2011      LABS:  Lab on 12/27/2020  Component Date Value Ref Range Status   Cholesterol 12/27/2020 147  0 - 200 mg/dL Final   ATP III Classification       Desirable:  < 200 mg/dL               Borderline High:  200 - 239 mg/dL          High:  > = 240 mg/dL   Triglycerides 12/27/2020 97.0  0.0 - 149.0 mg/dL Final   Normal:  <150 mg/dLBorderline High:  150 - 199 mg/dL   HDL 12/27/2020 50.50  >39.00 mg/dL Final   VLDL 12/27/2020 19.4  0.0 - 40.0 mg/dL Final   LDL Cholesterol 12/27/2020 77  0 - 99 mg/dL Final   Total CHOL/HDL Ratio 12/27/2020 3   Final                  Men          Women1/2 Average Risk     3.4          3.3Average Risk          5.0          4.42X Average Risk          9.6          7.13X Average Risk          15.0          11.0                       NonHDL 12/27/2020 96.44   Final   NOTE:  Non-HDL goal should be 30 mg/dL higher than patient's LDL goal (i.e. LDL goal of < 70 mg/dL, would have non-HDL goal of < 100 mg/dL)   Microalb, Ur 12/27/2020 1.8  0.0 - 1.9 mg/dL Final   Creatinine,U 12/27/2020 82.6  mg/dL Final   Microalb Creat Ratio 12/27/2020 2.1  0.0 - 30.0 mg/g Final   Sodium 12/27/2020 137  135 - 145 mEq/L Final   Potassium 12/27/2020 4.8  3.5 - 5.1 mEq/L Final   Chloride 12/27/2020 103  96 - 112 mEq/L Final   CO2 12/27/2020 26  19 - 32 mEq/L Final   Glucose, Bld 12/27/2020 159 (A) 70 - 99 mg/dL Final   BUN 12/27/2020 15  6 - 23 mg/dL Final   Creatinine, Ser 12/27/2020 0.96  0.40 - 1.50 mg/dL Final   Total Bilirubin  12/27/2020 0.6  0.2 - 1.2 mg/dL Final   Alkaline Phosphatase 12/27/2020 72  39 - 117 U/L Final   AST 12/27/2020 29  0 - 37 U/L Final   ALT 12/27/2020 30  0 - 53 U/L Final   Total Protein 12/27/2020 7.8  6.0 - 8.3 g/dL Final   Albumin 12/27/2020 4.1  3.5 - 5.2 g/dL Final   GFR 12/27/2020 92.50  >60.00 mL/min Final   Calculated using the CKD-EPI Creatinine Equation (2021)   Calcium 12/27/2020 9.1  8.4 - 10.5 mg/dL Final   Hgb A1c MFr Bld 12/27/2020 9.3 (A) 4.6 - 6.5 % Final   Glycemic Control Guidelines for People with Diabetes:Non Diabetic:  <6%Goal of Therapy: <7%Additional Action Suggested:  >8%     Physical Examination:  BP 130/78   Pulse 74   Ht '5\' 5"'  (1.651 m)   Wt 175 lb 3.2 oz (79.5 kg)  SpO2 98%   BMI 29.15 kg/m      ASSESSMENT:  Diabetes type 2 with mild obesity  See history of present illness for discussion of current diabetes management, blood sugar patterns and problems identified  His A1c is consistently over 7% and now 9.3  He is on a 3 drug regimen of , Metformin, Actos and Wilder Glade  He is again very irregular with his follow-up and medication regimen He appears to have had GI side effects from Rybelsus 3 mg and did not let us know he could not continue this  Discussed with him that his A1c is now the highest he has had since he has been coming here He likely will need to be on a GLP-1 drug or Mounjaro by injections since he may not tolerate oral GLP-1 drugs However despite showing him the simplicity of using the [facility] autoinjector he is very consistently refusing to take an injectable medication, explained to him that this is not insulin Also explained to him that he has progression of his diabetes and likely may need insulin in the near future  Plan: Restart Januvia for now Actos 30 mg daily He will cut back on eating rice and start using more whole-wheat products for bread  Needs much more regular exercise To check blood sugars regularly especially  after meals Given brochure on Mounjaro and he will look this over and also discussed with his wife who is a [employer] scientist and can do the injections  Continue rosuvastatin same doses for hyperlipidemia  Needs consistent and regular follow-up, appointment made for 2 months   There are no Patient Instructions on file for this visit.    Elayne Snare 01/01/2021, 9:15 AM

## 2021-01-01 NOTE — Patient Instructions (Addendum)
Check blood sugars on waking up 3-4 days a week  Also check blood sugars about 2 hours after meals and do this after different meals by rotation  Recommended blood sugar levels on waking up are 90-130 and about 2 hours after meal is 130-180  Please bring your blood sugar monitor to each visit, thank you  Exercise 4/7 days  Pio 2 tabs of 15mg  daily

## 2021-02-01 ENCOUNTER — Other Ambulatory Visit (HOSPITAL_COMMUNITY): Payer: Self-pay

## 2021-02-13 ENCOUNTER — Other Ambulatory Visit: Payer: Self-pay | Admitting: Endocrinology

## 2021-02-13 ENCOUNTER — Other Ambulatory Visit (HOSPITAL_COMMUNITY): Payer: Self-pay

## 2021-02-13 MED ORDER — DAPAGLIFLOZIN PROPANEDIOL 10 MG PO TABS
ORAL_TABLET | Freq: Every day | ORAL | 0 refills | Status: DC
  Filled 2021-02-13: qty 90, 90d supply, fill #0

## 2021-03-01 ENCOUNTER — Other Ambulatory Visit: Payer: BC Managed Care – PPO

## 2021-03-04 ENCOUNTER — Other Ambulatory Visit: Payer: Self-pay | Admitting: Endocrinology

## 2021-03-04 ENCOUNTER — Other Ambulatory Visit (HOSPITAL_COMMUNITY): Payer: Self-pay

## 2021-03-04 MED ORDER — JANUMET XR 50-1000 MG PO TB24
ORAL_TABLET | ORAL | 1 refills | Status: DC
  Filled 2021-03-04: qty 60, 30d supply, fill #0
  Filled 2021-04-08: qty 60, 30d supply, fill #1

## 2021-03-07 ENCOUNTER — Ambulatory Visit: Payer: BC Managed Care – PPO | Admitting: Endocrinology

## 2021-03-11 ENCOUNTER — Other Ambulatory Visit: Payer: Self-pay | Admitting: Endocrinology

## 2021-03-11 ENCOUNTER — Other Ambulatory Visit (HOSPITAL_COMMUNITY): Payer: Self-pay

## 2021-03-11 DIAGNOSIS — E785 Hyperlipidemia, unspecified: Secondary | ICD-10-CM

## 2021-03-11 MED ORDER — ROSUVASTATIN CALCIUM 20 MG PO TABS
ORAL_TABLET | Freq: Every day | ORAL | 0 refills | Status: DC
  Filled 2021-03-11: qty 90, 90d supply, fill #0

## 2021-03-30 DIAGNOSIS — U071 COVID-19: Secondary | ICD-10-CM | POA: Diagnosis not present

## 2021-04-08 ENCOUNTER — Other Ambulatory Visit (HOSPITAL_COMMUNITY): Payer: Self-pay

## 2021-04-09 ENCOUNTER — Other Ambulatory Visit (HOSPITAL_COMMUNITY): Payer: Self-pay

## 2021-04-11 ENCOUNTER — Other Ambulatory Visit: Payer: BC Managed Care – PPO

## 2021-04-12 ENCOUNTER — Other Ambulatory Visit: Payer: Self-pay

## 2021-04-12 ENCOUNTER — Other Ambulatory Visit: Payer: Self-pay | Admitting: Endocrinology

## 2021-04-12 ENCOUNTER — Other Ambulatory Visit (INDEPENDENT_AMBULATORY_CARE_PROVIDER_SITE_OTHER): Payer: BC Managed Care – PPO

## 2021-04-12 DIAGNOSIS — E1165 Type 2 diabetes mellitus with hyperglycemia: Secondary | ICD-10-CM

## 2021-04-12 LAB — BASIC METABOLIC PANEL
BUN: 9 mg/dL (ref 6–23)
CO2: 28 mEq/L (ref 19–32)
Calcium: 9.5 mg/dL (ref 8.4–10.5)
Chloride: 101 mEq/L (ref 96–112)
Creatinine, Ser: 0.89 mg/dL (ref 0.40–1.50)
GFR: 100.09 mL/min (ref >60.00)
Glucose, Bld: 123 mg/dL — ABNORMAL HIGH (ref 70–99)
Potassium: 5.3 mEq/L — ABNORMAL HIGH (ref 3.5–5.1)
Sodium: 137 mEq/L (ref 135–145)

## 2021-04-12 LAB — HEMOGLOBIN A1C: Hgb A1c MFr Bld: 7.3 % — ABNORMAL HIGH (ref 4.6–6.5)

## 2021-04-16 ENCOUNTER — Other Ambulatory Visit: Payer: Self-pay

## 2021-04-16 ENCOUNTER — Ambulatory Visit (INDEPENDENT_AMBULATORY_CARE_PROVIDER_SITE_OTHER): Payer: BC Managed Care – PPO | Admitting: Endocrinology

## 2021-04-16 ENCOUNTER — Encounter: Payer: Self-pay | Admitting: Endocrinology

## 2021-04-16 VITALS — BP 118/84 | HR 80 | Ht 65.0 in | Wt 175.6 lb

## 2021-04-16 DIAGNOSIS — E785 Hyperlipidemia, unspecified: Secondary | ICD-10-CM

## 2021-04-16 DIAGNOSIS — E1165 Type 2 diabetes mellitus with hyperglycemia: Secondary | ICD-10-CM | POA: Diagnosis not present

## 2021-04-16 LAB — GLUCOSE, POCT (MANUAL RESULT ENTRY): POC Glucose: 122 mg/dl — AB (ref 70–99)

## 2021-04-16 NOTE — Progress Notes (Signed)
Shawn Walter 50 y.o.              Reason for Appointment : Followup for Type 2 Diabetes  History of Present Illness          Diagnosis: Type 2 diabetes mellitus, date of diagnosis: 2004        Past history: His glucose was about 220 at diagnosis. He thinks he was not treated with medications but told to modify his diet and start exercise. Not clear how his diabetes was controlled with this regimen He thinks about 3 years ago he was started on metformin, probably when his A1c was 8.2%. Blood sugar was somewhat better but his A1c at best was 7.1% Although he had some initial improvement with metformin initially his blood sugars have been generally higher this year especially in 1/14 At some point he was also given Amaryl 4 mg but this was stopped when he was having low blood sugars Prior to his initial consultation he had been totally noncompliant with diet, exercise and glucose monitoring. He  probably gained weight also. He was on Janumet and Actos but his glucose on the lab was 348 along with A1c of 9.5  Recent history:   Treatment regimen: Janumet 50/1000 twice a day and Actos 30 mg daily, Farxiga 10 mg daily  A1c is back to 7.3 compared to 9.3  Current problems, management and blood sugar levels: His A1c has been above 7% since 2018 He was told to go back to Janumet instead of metformin and also increase his Actos up to 30 mg in August  He did not come back for follow-up as scheduled  Although he has better blood sugar control he does not think he has improved his dietary regimen He still is eating large amounts of rice at his meals Also he thinks he is getting sweets frequently Also is eating relatively large amounts of fruits including bananas and oranges He is afraid of pricking his finger and is not checking his blood sugar  Has fasting glucose was 123 in the lab  He has not done any exercise, partly because of recovering from [place] about 2 months ago No change in his  weight     Glucose monitoring:  less than once a day       Glucometer: Accu-Chek    Blood Glucose readings not available   Dietician visit: Most recent: At the time of diagnosis                  Wt Readings from Last 3 Encounters:  04/16/21 175 lb 9.6 oz (79.7 kg)  01/01/21 175 lb 3.2 oz (79.5 kg)  07/09/20 176 lb (79.8 kg)    Lab Results  Component Value Date   HGBA1C 7.3 (H) 04/12/2021   HGBA1C 9.3 (H) 12/27/2020   HGBA1C 7.3 (H) 07/04/2020   Lab Results  Component Value Date   MICROALBUR 1.8 12/27/2020   LDLCALC 77 12/27/2020   CREATININE 0.89 04/12/2021   Office Visit on 04/16/2021  Component Date Value Ref Range Status   POC Glucose 04/16/2021 122 (A)  70 - 99 mg/dl Final  Lab on 04/12/2021  Component Date Value Ref Range Status   Sodium 04/12/2021 137  135 - 145 mEq/L Final   Potassium 04/12/2021 5.3 No hemolysis seen (H)  3.5 - 5.1 mEq/L Final   Chloride 04/12/2021 101  96 - 112 mEq/L Final   CO2 04/12/2021 28  19 - 32 mEq/L Final   Glucose,  Bld 04/12/2021 123 (H)  70 - 99 mg/dL Final   BUN 04/12/2021 9  6 - 23 mg/dL Final   Creatinine, Ser 04/12/2021 0.89  0.40 - 1.50 mg/dL Final   GFR 04/12/2021 100.09  >60.00 mL/min Final   Calculated using the CKD-EPI Creatinine Equation (2021)   Calcium 04/12/2021 9.5  8.4 - 10.5 mg/dL Final   Hgb A1c MFr Bld 04/12/2021 7.3 (H)  4.6 - 6.5 % Final   Glycemic Control Guidelines for People with Diabetes:Non Diabetic:  <6%Goal of Therapy: <7%Additional Action Suggested:  >8%      Allergies as of 04/16/2021   No Known Allergies      Medication List        Accurate as of April 16, 2021 10:19 AM. If you have any questions, ask your nurse or doctor.          Accu-Chek [employer] Kit Use as instructed to check blood sugar daily.   Accu-Chek FastClix Lancets Misc Use as instructed to check blood sugar daily.   Farxiga 10 MG Tabs tablet Generic drug: dapagliflozin propanediol TAKE 1 TABLET BY MOUTH  DAILY.   FreeStyle Freedom Lite w/Device Kit Use as instructed to check blood sugar once daily.   FREESTYLE LITE test strip Generic drug: glucose blood USE 1 FREESTYLE LITE TEST STRIP AS INSTRUCTED TO CHECK BLOOD SUGAR ONCE DAILY.   Janumet XR 50-1000 MG Tb24 Generic drug: SitaGLIPtin-MetFORMIN HCl Take 2 tablets by mouth daily   pioglitazone 30 MG tablet Commonly known as: ACTOS Take 1 tablet (30 mg total) by mouth daily.   rosuvastatin 20 MG tablet Commonly known as: CRESTOR TAKE 1 TABLET BY MOUTH ONCE A DAY   Rybelsus 7 MG Tabs Generic drug: Semaglutide TAKE 1 TABLET BY MOUTH DAILY, 30 MINUTES BEFORE BREAKFAST, WITH WATER. START AFTER 3MG SAMPLES.          Allergies: No Known Allergies  Past Medical History:  Diagnosis Date   Diabetes mellitus    Hyperlipidemia    Normal cardiac stress test 02-2011    Renal cyst, right    Complex, Dr. Louis Meckel   Urolithiasis 11-2013   L    Past Surgical History:  Procedure Laterality Date   INCISE AND DRAIN ABCESS  07/11/11   perirectal abscess     Family History  Problem Relation Age of Onset   Breast cancer Mother    Sudden death Brother 31       MI    Hypertension Neg Hx    Hyperlipidemia Neg Hx    Heart attack Neg Hx    Diabetes Neg Hx    Colon cancer Neg Hx    Prostate cancer Neg Hx     Social History:  reports that he quit smoking about 21 years ago. His smoking use included cigarettes. He has never used smokeless tobacco. He reports current alcohol use. He reports that he does not use drugs.    Review of Systems   No history of hypertension       Lipids: He has been treated with Crestor 20 mg Baseline LDL as high as 178  Last lipids as follows  Has a positive history of sudden death in his family with his brother dying at the age of 68    Lab Results  Component Value Date   CHOL 147 12/27/2020   CHOL 154 07/04/2020   CHOL 166 02/02/2020   Lab Results  Component Value Date   HDL 50.50  12/27/2020   HDL  48.90 07/04/2020   HDL 54.40 02/02/2020   Lab Results  Component Value Date   LDLCALC 77 12/27/2020   LDLCALC 87 07/04/2020   LDLCALC 89 02/02/2020   Lab Results  Component Value Date   TRIG 97.0 12/27/2020   TRIG 89.0 07/04/2020   TRIG 113.0 02/02/2020   Lab Results  Component Value Date   CHOLHDL 3 12/27/2020   CHOLHDL 3 07/04/2020   CHOLHDL 3 02/02/2020   Lab Results  Component Value Date   LDLDIRECT 178.1 04/20/2013   LDLDIRECT 173.6 05/14/2012   LDLDIRECT 119.7 02/27/2011      LABS:  Office Visit on 04/16/2021  Component Date Value Ref Range Status   POC Glucose 04/16/2021 122 (A)  70 - 99 mg/dl Final  Lab on 04/12/2021  Component Date Value Ref Range Status   Sodium 04/12/2021 137  135 - 145 mEq/L Final   Potassium 04/12/2021 5.3 No hemolysis seen (H)  3.5 - 5.1 mEq/L Final   Chloride 04/12/2021 101  96 - 112 mEq/L Final   CO2 04/12/2021 28  19 - 32 mEq/L Final   Glucose, Bld 04/12/2021 123 (H)  70 - 99 mg/dL Final   BUN 04/12/2021 9  6 - 23 mg/dL Final   Creatinine, Ser 04/12/2021 0.89  0.40 - 1.50 mg/dL Final   GFR 04/12/2021 100.09  >60.00 mL/min Final   Calculated using the CKD-EPI Creatinine Equation (2021)   Calcium 04/12/2021 9.5  8.4 - 10.5 mg/dL Final   Hgb A1c MFr Bld 04/12/2021 7.3 (H)  4.6 - 6.5 % Final   Glycemic Control Guidelines for People with Diabetes:Non Diabetic:  <6%Goal of Therapy: <7%Additional Action Suggested:  >8%     Physical Examination:  BP 118/84 (BP Location: Left Arm, Patient Position: Sitting, Cuff Size: Normal)   Pulse 80   Ht '5\' 5"'  (1.651 m)   Wt 175 lb 9.6 oz (79.7 kg)   SpO2 98%   BMI 29.22 kg/m      ASSESSMENT:  Diabetes type 2 with mild obesity  See history of present illness for discussion of current diabetes management, blood sugar patterns and problems identified  His A1c is consistently over 7% and now 7.3  He is on a 3 drug regimen of , Janumet, Actos and Wilder Glade  He seems to  be benefiting from going back on Januvia along with increasing Actos  He may be doing a little better with his diet overall but still getting excessive carbohydrates and sweets No exercise slightly Does not like to monitor his blood sugar Previously was advised to consider starting Mounjaro but he still is reluctant to take any injectable medications because of fear of needles  Plan: No change in medications at this time He will cut back on portions of rice, switch to brown rice and start using more whole-wheat products for bread  Needs to start a program of regular exercise He will try to get his wife to help him check his sugars especially after meals  Needs regular follow-up    There are no Patient Instructions on file for this visit.    Elayne Snare 04/16/2021, 10:19 AM

## 2021-04-18 ENCOUNTER — Telehealth: Payer: Self-pay | Admitting: Internal Medicine

## 2021-04-18 NOTE — Telephone Encounter (Signed)
Patient's wife is calling because she had faxed papers over for FMLA regarding her husband getting Covid on 11/19 and her having to stay home and take care of him. Due to this, her job is asking her to get the paperwork filled out by Mr. Muscatello PCP since he was the one actually sick. She states they went to urgent care and has all the paperwork we might need. Please advice.

## 2021-04-18 NOTE — Telephone Encounter (Signed)
PCP has not seen Pt since 11/2019- will need an office visit to complete paperwork- he should bring that with him when he comes to the office please.

## 2021-04-19 NOTE — Telephone Encounter (Signed)
Appointment made for 12/12

## 2021-04-22 ENCOUNTER — Ambulatory Visit (INDEPENDENT_AMBULATORY_CARE_PROVIDER_SITE_OTHER): Payer: BC Managed Care – PPO | Admitting: Internal Medicine

## 2021-04-22 ENCOUNTER — Encounter: Payer: Self-pay | Admitting: Internal Medicine

## 2021-04-22 VITALS — BP 126/74 | HR 81 | Temp 98.5°F | Resp 16 | Ht 65.0 in | Wt 177.4 lb

## 2021-04-22 DIAGNOSIS — U071 COVID-19: Secondary | ICD-10-CM

## 2021-04-22 DIAGNOSIS — Z23 Encounter for immunization: Secondary | ICD-10-CM | POA: Diagnosis not present

## 2021-04-22 NOTE — Progress Notes (Signed)
Subjective:    Patient ID: Shawn Walter, male    DOB: 1971/03/14, 50 y.o.   MRN: [id number]  DOS:  04/22/2021 Type of visit - description: acute  Tested positive for COVID 03/30/2021. Patient was taking care of at home by his wife. He went back to work approximately 04/06/2021.  At this point, he feels better. Denies fever chills Still has occasional nausea and mild cough with clear sputum. Still feeling fatigue Has occasional headache. Denies chest pain no difficulty breathing. Myalgias have resolved.   Review of Systems See above   Past Medical History:  Diagnosis Date   Diabetes mellitus    Hyperlipidemia    Normal cardiac stress test 02-2011    Renal cyst, right    Complex, Dr. Louis Meckel   Urolithiasis 11-2013   L    Past Surgical History:  Procedure Laterality Date   INCISE AND DRAIN ABCESS  07/11/11   perirectal abscess     Allergies as of 04/22/2021   No Known Allergies      Medication List        Accurate as of April 22, 2021 12:03 PM. If you have any questions, ask your nurse or doctor.          Accu-Chek [employer] Kit Use as instructed to check blood sugar daily.   Accu-Chek FastClix Lancets Misc Use as instructed to check blood sugar daily.   Farxiga 10 MG Tabs tablet Generic drug: dapagliflozin propanediol TAKE 1 TABLET BY MOUTH DAILY.   FreeStyle Freedom Lite w/Device Kit Use as instructed to check blood sugar once daily.   FREESTYLE LITE test strip Generic drug: glucose blood USE 1 FREESTYLE LITE TEST STRIP AS INSTRUCTED TO CHECK BLOOD SUGAR ONCE DAILY.   Janumet XR 50-1000 MG Tb24 Generic drug: SitaGLIPtin-MetFORMIN HCl Take 2 tablets by mouth daily   pioglitazone 30 MG tablet Commonly known as: ACTOS Take 1 tablet (30 mg total) by mouth daily.   rosuvastatin 20 MG tablet Commonly known as: CRESTOR TAKE 1 TABLET BY MOUTH ONCE A DAY   Rybelsus 7 MG Tabs Generic drug: Semaglutide TAKE 1 TABLET BY MOUTH DAILY,  30 MINUTES BEFORE BREAKFAST, WITH WATER. START AFTER 3MG SAMPLES.           Objective:   Physical Exam BP 126/74 (BP Location: Left Arm, Patient Position: Sitting, Cuff Size: Small)   Pulse 81   Temp 98.5 F (36.9 C) (Oral)   Resp 16   Ht '5\' 5"'  (1.651 m)   Wt 177 lb 6 oz (80.5 kg)   SpO2 98%   BMI 29.52 kg/m  General:   Well developed, NAD, BMI noted. HEENT:  Normocephalic . Face symmetric, atraumatic Lungs:  CTA B Normal respiratory effort, no intercostal retractions, no accessory muscle use. Heart: RRR,  no murmur.  Lower extremities: no pretibial edema bilaterally  Skin: Not pale. Not jaundice Neurologic:  alert & oriented X3.  Speech normal, gait appropriate for age and unassisted Psych--  Cognition and judgment appear intact.  Cooperative with normal attention span and concentration.  Behavior appropriate. No anxious or depressed appearing.      Assessment    Assessment DM: Dr Dwyane Dee  Hyperlipidemia GU: --Urolithiasis  --L--  11-2013 --Multiple renal cysts per ultrasound 2016, MRI 10/2015:showing multiple bilateral Bosniak 1 and 2 renal cysts (no further eval per literature) --Pyelonephritis 09-2014 + FH CAD brother age 69.  Stress test (-) 2012  PLAN  COVID-19 Sxs onset 03/30/2021.  Received no treatment.  Overall improved, has some residual symptoms. Recommend to let me know if he is gradually not back to normal in the next few weeks. Also recommend to proceed with a COVID-vaccine booster January 2023. If he ever gets sick again, advised there is medications available (antivirals) Needs FMLA paperwork for his wife who took care of him. Preventive care: Flu shot today Rec to proceed with a COVID booster in few weeks. Due for CPX, recommend to schedule.    This visit occurred during the SARS-CoV-2 public health emergency.  Safety protocols were in place, including screening questions prior to the visit, additional usage of staff PPE, and extensive  cleaning of exam room while observing appropriate contact time as indicated for disinfecting solutions.

## 2021-04-22 NOTE — Assessment & Plan Note (Signed)
COVID-19 Sxs onset 03/30/2021.  Received no treatment.  Overall improved, has some residual symptoms. Recommend to let me know if he is gradually not back to normal in the next few weeks. Also recommend to proceed with a COVID-vaccine booster January 2023. If he ever gets sick again, advised there is medications available (antivirals) Needs FMLA paperwork for his wife who took care of him. Preventive care: Flu shot today Rec to proceed with a COVID booster in few weeks. Due for CPX, recommend to schedule.

## 2021-04-22 NOTE — Patient Instructions (Addendum)
Per our records you are due for your diabetic eye exam. Please contact your eye doctor to schedule an appointment. Please have them send copies of your office visit notes to [place]. Our fax number is [phone]. If you need a referral to an eye doctor please let us know.   GO TO THE FRONT DESK, PLEASE SCHEDULE YOUR APPOINTMENTS Come back for  a physical exam fasting at your convenience

## 2021-05-07 ENCOUNTER — Other Ambulatory Visit: Payer: Self-pay | Admitting: Endocrinology

## 2021-05-08 ENCOUNTER — Other Ambulatory Visit (HOSPITAL_COMMUNITY): Payer: Self-pay

## 2021-05-09 ENCOUNTER — Other Ambulatory Visit: Payer: Self-pay | Admitting: Endocrinology

## 2021-05-09 ENCOUNTER — Other Ambulatory Visit (HOSPITAL_COMMUNITY): Payer: Self-pay

## 2021-05-09 MED ORDER — DAPAGLIFLOZIN PROPANEDIOL 10 MG PO TABS: ORAL_TABLET | Freq: Every day | ORAL | 0 refills | Status: DC

## 2021-05-09 MED ORDER — JANUMET XR 50-1000 MG PO TB24: ORAL_TABLET | ORAL | 1 refills | Status: DC

## 2021-05-09 NOTE — Telephone Encounter (Signed)
Patient called and requested that [facility] of [facility] (requesting RX). Patient advises he will be out of medication tonight and he is set up for mail order and does not want to drive into [place] today for RX pick up.  Call patient at [phone]

## 2021-05-17 ENCOUNTER — Ambulatory Visit (INDEPENDENT_AMBULATORY_CARE_PROVIDER_SITE_OTHER): Payer: BC Managed Care – PPO | Admitting: Internal Medicine

## 2021-05-17 ENCOUNTER — Ambulatory Visit: Payer: BC Managed Care – PPO | Attending: Internal Medicine

## 2021-05-17 ENCOUNTER — Encounter: Payer: Self-pay | Admitting: Internal Medicine

## 2021-05-17 VITALS — BP 108/64 | HR 74 | Temp 98.1°F | Resp 16 | Ht 65.0 in | Wt 175.5 lb

## 2021-05-17 DIAGNOSIS — Z1159 Encounter for screening for other viral diseases: Secondary | ICD-10-CM | POA: Diagnosis not present

## 2021-05-17 DIAGNOSIS — Z1211 Encounter for screening for malignant neoplasm of colon: Secondary | ICD-10-CM

## 2021-05-17 DIAGNOSIS — Z23 Encounter for immunization: Secondary | ICD-10-CM

## 2021-05-17 DIAGNOSIS — Z0000 Encounter for general adult medical examination without abnormal findings: Secondary | ICD-10-CM | POA: Diagnosis not present

## 2021-05-17 NOTE — Progress Notes (Signed)
Subjective:    Patient ID: Shawn Walter, male    DOB: [birth year], 51 y.o.   MRN: [id number]  DOS:  05/17/2021 Type of visit - description: cpx  Here for CPX. Was seen with COVID last month, he is better, still somewhat weak and has some cough but again much improved  Review of Systems  Other than above, a 14 point review of systems is negative       Past Medical History:  Diagnosis Date   Diabetes mellitus    Hyperlipidemia    Normal cardiac stress test 02-2011    Renal cyst, right    Complex, Dr. Louis Meckel   Urolithiasis 11-2013   L    Past Surgical History:  Procedure Laterality Date   INCISE AND DRAIN ABCESS  07/11/11   perirectal abscess    Social History   Socioeconomic History   Marital status: Married    Spouse name: Not on file   Number of children: 2   Years of education: Not on file   Highest education level: Not on file  Occupational History   Occupation: IT   Tobacco Use   Smoking status: Former    Types: Cigarettes    Quit date: 05/13/1999    Years since quitting: 22.0   Smokeless tobacco: Never  Vaping Use   Vaping Use: Never used  Substance and Sexual Activity   Alcohol use: Yes    Comment: occ   Drug use: No   Sexual activity: Not on file  Other Topics Concern   Not on file  Social History Narrative   Married, born in [place]     children x 2  (daughter 2004 >> college, son 2010)    Social Determinants of Health   Financial Resource Strain: Not on file  Food Insecurity: Not on file  Transportation Needs: Not on file  Physical Activity: Not on file  Stress: Not on file  Social Connections: Not on file  Intimate Partner Violence: Not on file     Current Outpatient Medications  Medication Instructions   Accu-Chek FastClix Lancets MISC Use as instructed to check blood sugar daily.    Blood Glucose Monitoring Suppl (FREESTYLE FREEDOM LITE) w/Device KIT Use as instructed to check blood sugar once daily.    dapagliflozin propanediol  (FARXIGA) 10 MG TABS tablet TAKE 1 TABLET BY MOUTH DAILY.   glucose blood test strip USE 1 FREESTYLE LITE TEST STRIP AS INSTRUCTED TO CHECK BLOOD SUGAR ONCE DAILY.   Lancets Misc. (ACCU-CHEK FASTCLIX LANCET) KIT Use as instructed to check blood sugar daily.    pioglitazone (ACTOS) 30 mg, Oral, Daily   rosuvastatin (CRESTOR) 20 MG tablet TAKE 1 TABLET BY MOUTH ONCE A DAY   Semaglutide 7 MG TABS TAKE 1 TABLET BY MOUTH DAILY, 30 MINUTES BEFORE BREAKFAST, WITH WATER. START AFTER 3MG SAMPLES.   SitaGLIPtin-MetFORMIN HCl (JANUMET XR) 50-1000 MG TB24 Take 2 tablets by mouth daily       Objective:   Physical Exam BP 108/64 (BP Location: Left Arm, Patient Position: Sitting, Cuff Size: Small)    Pulse 74    Temp 98.1 F (36.7 C) (Oral)    Resp 16    Ht '5\' 5"'  (1.651 m)    Wt 175 lb 8 oz (79.6 kg)    SpO2 97%    BMI 29.20 kg/m  General: Well developed, NAD, BMI noted Neck: No  thyromegaly  HEENT:  Normocephalic . Face symmetric, atraumatic Lungs:  CTA B Normal respiratory effort,  no intercostal retractions, no accessory muscle use. Heart: RRR,  no murmur.  Abdomen:  Not distended, soft, non-tender. No rebound or rigidity. DRE: Normal sphincter tone, no stools, prostate normal Lower extremities: no pretibial edema bilaterally  Skin: Exposed areas without rash. Not pale. Not jaundice Neurologic:  alert & oriented X3.  Speech normal, gait appropriate for age and unassisted Strength symmetric and appropriate for age.  Psych: Cognition and judgment appear intact.  Cooperative with normal attention span and concentration.  Behavior appropriate. No anxious or depressed appearing.     Assessment     Assessment DM: Dr Dwyane Dee  Hyperlipidemia GU: --Urolithiasis  --L--  11-2013 --Multiple renal cysts per ultrasound 2016, MRI 10/2015:showing multiple bilateral Bosniak 1 and 2 renal cysts (no further eval per literature) --Pyelonephritis 2014-10-10 + FH CAD brother age 63 died on his asleep, unclear  if he had an MI.  Stress test (-) 2012  PLAN  Here for CPX DM: Per Endo Hyperlipidemia: Well-controlled on Crestor. RTC 6 to 8 months:   This visit occurred during the SARS-CoV-2 public health emergency.  Safety protocols were in place, including screening questions prior to the visit, additional usage of staff PPE, and extensive cleaning of exam room while observing appropriate contact time as indicated for disinfecting solutions.

## 2021-05-17 NOTE — Progress Notes (Signed)
° °  Covid-19 Vaccination Clinic  Name:  HARSHAN JOURDAN    MRN: [id number] DOB: Nov 02, 1970  05/17/2021  Mr. Goris was observed post Covid-19 immunization for 15 minutes without incident. He was provided with Vaccine Information Sheet and instruction to access the V-Safe system.   Mr. Lyng was instructed to call 911 with any severe reactions post vaccine: Difficulty breathing  Swelling of face and throat  A fast heartbeat  A bad rash all over body  Dizziness and weakness   Immunizations Administered     Name Date Dose VIS Date Route   Pfizer Covid-19 Vaccine Bivalent Booster 05/17/2021  3:00 PM 0.3 mL 01/09/2021 Intramuscular   Manufacturer: [place]   Lot: [id number]   [place]: [phone]

## 2021-05-17 NOTE — Patient Instructions (Addendum)
Vaccines are recommended: Shingrix COVID-vaccine booster   GO TO THE LAB : Get the blood work     GO TO THE FRONT DESK, PLEASE SCHEDULE YOUR APPOINTMENTS Come back for   a checkup in 6 to 8 months  "Living will", "Health Care Power of attorney": Advanced care planning  (If you already have a living will or healthcare power of attorney, please bring the copy to be scanned in your chart.)  Advance care planning is a process that supports adults in  understanding and sharing their preferences regarding future medical care.   The patient's preferences are recorded in documents called Advance Directives.    Advanced directives are completed (and can be modified at any time) while the patient is in full mental capacity.   The documentation should be available at all times to the patient, the family and the healthcare providers.  Bring in a copy to be scanned in your chart is an excellent idea and is recommended   This legal documents direct treatment decision making and/or appoint a surrogate to make the decision if the patient is not capable to do so.    Advance directives can be documented in many types of formats,  documents have names such as:  Lliving will  Durable power of attorney for healthcare (healthcare proxy or healthcare power of attorney)  Combined directives  Physician orders for life-sustaining treatment    More information at:  [email or web address] provided

## 2021-05-19 ENCOUNTER — Encounter: Payer: Self-pay | Admitting: Internal Medicine

## 2021-05-19 NOTE — Assessment & Plan Note (Signed)
-  Tdap 09-2016 -Shingrix discussed -  pnm shot 2017 - prevnar at age 51  - covid booster: Plans to get it today - flu shot done -- [facility]: requests a cscope, send referral, rec  to call GI. --prostate ca screening: No symptoms, DRE normal, check PSA --Labs :CMP, CBC TSH PSA hep C --Diet and exercise discussed  -ACP information provided

## 2021-05-20 ENCOUNTER — Other Ambulatory Visit (HOSPITAL_BASED_OUTPATIENT_CLINIC_OR_DEPARTMENT_OTHER): Payer: Self-pay

## 2021-05-20 LAB — COMPREHENSIVE METABOLIC PANEL
AG Ratio: 1.4 (calc) (ref 1.0–2.5)
ALT: 23 U/L (ref 9–46)
AST: 23 U/L (ref 10–35)
Albumin: 4.4 g/dL (ref 3.6–5.1)
Alkaline phosphatase (APISO): 65 U/L (ref 35–144)
BUN: 13 mg/dL (ref 7–25)
CO2: 22 mmol/L (ref 20–32)
Calcium: 9.3 mg/dL (ref 8.6–10.3)
Chloride: 103 mmol/L (ref 98–110)
Creat: 0.87 mg/dL (ref 0.70–1.30)
Globulin: 3.2 g/dL (calc) (ref 1.9–3.7)
Glucose, Bld: 104 mg/dL — ABNORMAL HIGH (ref 65–99)
Potassium: 4.9 mmol/L (ref 3.5–5.3)
Sodium: 140 mmol/L (ref 135–146)
Total Bilirubin: 0.6 mg/dL (ref 0.2–1.2)
Total Protein: 7.6 g/dL (ref 6.1–8.1)

## 2021-05-20 LAB — HEPATITIS C ANTIBODY
Hepatitis C Ab: NONREACTIVE
SIGNAL TO CUT-OFF: 0.08 (ref <1.00)

## 2021-05-20 LAB — CBC WITH DIFFERENTIAL/PLATELET
Absolute Monocytes: 626 cells/uL (ref 200–950)
Basophils Absolute: 70 cells/uL (ref 0–200)
Basophils Relative: 1.2 %
Eosinophils Absolute: 81 cells/uL (ref 15–500)
Eosinophils Relative: 1.4 %
HCT: 43.5 % (ref 38.5–50.0)
Hemoglobin: 14.5 g/dL (ref 13.2–17.1)
Lymphs Abs: 1850 cells/uL (ref 850–3900)
MCH: 29.8 pg (ref 27.0–33.0)
MCHC: 33.3 g/dL (ref 32.0–36.0)
MCV: 89.5 fL (ref 80.0–100.0)
MPV: 9.8 fL (ref 7.5–12.5)
Monocytes Relative: 10.8 %
Neutro Abs: 3173 cells/uL (ref 1500–7800)
Neutrophils Relative %: 54.7 %
Platelets: 292 10*3/uL (ref 140–400)
RBC: 4.86 10*6/uL (ref 4.20–5.80)
RDW: 12.9 % (ref 11.0–15.0)
Total Lymphocyte: 31.9 %
WBC: 5.8 10*3/uL (ref 3.8–10.8)

## 2021-05-20 LAB — TSH: TSH: 4.03 mIU/L (ref 0.40–4.50)

## 2021-05-20 LAB — PSA: PSA: 0.48 ng/mL (ref <=4.00)

## 2021-05-20 MED ORDER — PFIZER COVID-19 VAC BIVALENT 30 MCG/0.3ML IM SUSP
INTRAMUSCULAR | 0 refills | Status: DC
  Filled 2021-05-20: qty 0.3, 1d supply, fill #0

## 2021-06-07 ENCOUNTER — Other Ambulatory Visit: Payer: Self-pay | Admitting: Endocrinology

## 2021-06-07 DIAGNOSIS — E785 Hyperlipidemia, unspecified: Secondary | ICD-10-CM

## 2021-06-10 ENCOUNTER — Other Ambulatory Visit (HOSPITAL_COMMUNITY): Payer: Self-pay

## 2021-06-10 ENCOUNTER — Telehealth: Payer: Self-pay | Admitting: Endocrinology

## 2021-06-10 DIAGNOSIS — E1165 Type 2 diabetes mellitus with hyperglycemia: Secondary | ICD-10-CM

## 2021-06-10 DIAGNOSIS — E785 Hyperlipidemia, unspecified: Secondary | ICD-10-CM

## 2021-06-10 MED ORDER — ROSUVASTATIN CALCIUM 20 MG PO TABS
ORAL_TABLET | Freq: Every day | ORAL | 0 refills | Status: DC
  Filled 2021-06-10: qty 90, 90d supply, fill #0

## 2021-06-10 MED ORDER — ROSUVASTATIN CALCIUM 20 MG PO TABS: ORAL_TABLET | Freq: Every day | ORAL | 0 refills | Status: DC

## 2021-06-10 MED ORDER — PIOGLITAZONE HCL 30 MG PO TABS: 30.0000 mg | ORAL_TABLET | Freq: Every day | ORAL | 0 refills | Status: DC

## 2021-06-10 MED ORDER — PIOGLITAZONE HCL 30 MG PO TABS
30.0000 mg | ORAL_TABLET | Freq: Every day | ORAL | 0 refills | Status: DC
  Filled 2021-06-10: qty 90, 90d supply, fill #0

## 2021-06-10 NOTE — Telephone Encounter (Signed)
Pt is calling in stating that he would like to have rosuvastatin (CRESTOR) 20 MG and pioglitazone (ACTOS)  30 MG.  Pharm:  [facility].  Pt stated that he is out of the medication and would like to see if it can be called in today.  Pt would like to have a call back once it has been called in.

## 2021-06-10 NOTE — Telephone Encounter (Signed)
Prescriptions have now been sent to patient's preferred pharmacy. Attempted to contact the patient to inform him, received no answer.

## 2021-06-28 ENCOUNTER — Encounter: Payer: Self-pay | Admitting: Internal Medicine

## 2021-06-29 ENCOUNTER — Other Ambulatory Visit: Payer: Self-pay | Admitting: Endocrinology

## 2021-07-11 ENCOUNTER — Encounter: Payer: Self-pay | Admitting: Internal Medicine

## 2021-07-12 ENCOUNTER — Other Ambulatory Visit: Payer: BC Managed Care – PPO

## 2021-07-16 ENCOUNTER — Ambulatory Visit: Payer: BC Managed Care – PPO | Admitting: Endocrinology

## 2021-08-04 ENCOUNTER — Other Ambulatory Visit: Payer: Self-pay | Admitting: Endocrinology

## 2021-08-08 ENCOUNTER — Other Ambulatory Visit (INDEPENDENT_AMBULATORY_CARE_PROVIDER_SITE_OTHER): Payer: BC Managed Care – PPO

## 2021-08-08 DIAGNOSIS — E1165 Type 2 diabetes mellitus with hyperglycemia: Secondary | ICD-10-CM

## 2021-08-08 DIAGNOSIS — E785 Hyperlipidemia, unspecified: Secondary | ICD-10-CM

## 2021-08-08 LAB — LIPID PANEL
Cholesterol: 145 mg/dL (ref 0–200)
HDL: 50.3 mg/dL (ref >39.00)
LDL Cholesterol: 81 mg/dL (ref 0–99)
NonHDL: 95.02
Total CHOL/HDL Ratio: 3
Triglycerides: 72 mg/dL (ref 0.0–149.0)
VLDL: 14.4 mg/dL (ref 0.0–40.0)

## 2021-08-08 LAB — BASIC METABOLIC PANEL
BUN: 15 mg/dL (ref 6–23)
CO2: 26 mEq/L (ref 19–32)
Calcium: 9.4 mg/dL (ref 8.4–10.5)
Chloride: 105 mEq/L (ref 96–112)
Creatinine, Ser: 0.92 mg/dL (ref 0.40–1.50)
GFR: 96.93 mL/min (ref >60.00)
Glucose, Bld: 123 mg/dL — ABNORMAL HIGH (ref 70–99)
Potassium: 4.9 mEq/L (ref 3.5–5.1)
Sodium: 139 mEq/L (ref 135–145)

## 2021-08-08 LAB — HEMOGLOBIN A1C: Hgb A1c MFr Bld: 7 % — ABNORMAL HIGH (ref 4.6–6.5)

## 2021-08-12 ENCOUNTER — Ambulatory Visit (INDEPENDENT_AMBULATORY_CARE_PROVIDER_SITE_OTHER): Payer: BC Managed Care – PPO | Admitting: Endocrinology

## 2021-08-12 ENCOUNTER — Encounter: Payer: Self-pay | Admitting: Endocrinology

## 2021-08-12 VITALS — BP 110/72 | HR 62 | Ht 65.0 in | Wt 183.0 lb

## 2021-08-12 DIAGNOSIS — E1165 Type 2 diabetes mellitus with hyperglycemia: Secondary | ICD-10-CM | POA: Diagnosis not present

## 2021-08-12 DIAGNOSIS — Z6830 Body mass index (BMI) 30.0-30.9, adult: Secondary | ICD-10-CM | POA: Diagnosis not present

## 2021-08-12 DIAGNOSIS — E785 Hyperlipidemia, unspecified: Secondary | ICD-10-CM | POA: Diagnosis not present

## 2021-08-12 NOTE — Patient Instructions (Addendum)
Exercise daily  ? ?Check blood sugars on waking up days 2 a week ? ?Also check blood sugars about 2 hours after meals and do this after different meals by rotation ? ?Recommended blood sugar levels on waking up are 90-130 and about 2 hours after meal is 130-160 ? ?Please bring your blood sugar monitor to each visit, thank you ? ?

## 2021-08-12 NOTE — Progress Notes (Signed)
Shawn Walter 51 y.o. ? ? ?       ? ? ? ? ?Reason for Appointment : Followup of diabetes and lipids ? ?History of Present Illness  ?        ?Diagnosis: Type 2 diabetes mellitus, date of diagnosis: 2004       ? ?Past history: His glucose was about 220 at diagnosis. He thinks he was not treated with medications but told to modify his diet and start exercise. ?Not clear how his diabetes was controlled with this regimen ?He thinks about 3 years ago he was started on metformin, probably when his A1c was 8.2%. Blood sugar was somewhat better but his A1c at best was 7.1% ?Although he had some initial improvement with metformin initially his blood sugars have been generally higher this year especially in 1/14 ?At some point he was also given Amaryl 4 mg but this was stopped when he was having low blood sugars ?Prior to his initial consultation he had been totally noncompliant with diet, exercise and glucose monitoring. He  probably gained weight also. He was on Janumet and Actos but his glucose on the lab was 348 along with A1c of 9.5 ? ?Recent history:  ? ?Treatment regimen: Janumet 50/1000 XR twice a day and Actos 30 mg daily, Farxiga 10 mg daily ? ?A1c is improved at 7 ? ?Current problems, management and blood sugar levels: ?His A1c had been above 7% since 2018 but is now down to 7 ?He did not bring his monitor and likely is not checking blood sugars regularly as before  ?Also he had been traveling [place] in [place] and still has not been back on glucose monitoring at home  ?He thinks prior to his going overseas he was having fairly good blood sugars after meals also  ?More recently has been trying to cut back on rice and was also told to avoid large amounts of fruits  ?He has gained weight from his overseas trip and has not still motivated himself to exercise as discussed on each visit before  ?He has been taking his diabetes medicines quite regularly ?   ? ?Glucose monitoring:  less than once a day        Glucometer: Accu-Chek ?   ?Blood Glucose readings not available, previously ? ?120s fastings ?150-160 after meals ? ? ?Dietician visit: Most recent: At the time of diagnosis          ? ?       ?Wt Readings from Last 3 Encounters:  ?08/12/21 183 lb (83 kg)  ?05/17/21 175 lb 8 oz (79.6 kg)  ?04/22/21 177 lb 6 oz (80.5 kg)  ? ? ?Lab Results  ?Component Value Date  ? HGBA1C 7.0 (H) 08/08/2021  ? HGBA1C 7.3 (H) 04/12/2021  ? HGBA1C 9.3 (H) 12/27/2020  ? ?Lab Results  ?Component Value Date  ? MICROALBUR 1.8 12/27/2020  ? [place] 81 08/08/2021  ? CREATININE 0.92 08/08/2021  ? ?Lab on 08/08/2021  ?Component Date Value Ref Range Status  ? Cholesterol 08/08/2021 145  0 - 200 mg/dL Final  ? ATP III Classification       Desirable:  < 200 mg/dL               Borderline High:  200 - 239 mg/dL          High:  > = 240 mg/dL  ? Triglycerides 08/08/2021 72.0  0.0 - 149.0 mg/dL Final  ? Normal:  <150 mg/dLBorderline High:  150 -  199 mg/dL  ? HDL 08/08/2021 50.30  >39.00 mg/dL Final  ? VLDL 08/08/2021 14.4  0.0 - 40.0 mg/dL Final  ? LDL Cholesterol 08/08/2021 81  0 - 99 mg/dL Final  ? Total CHOL/HDL Ratio 08/08/2021 3   Final  ?                Men          Women1/2 Average Risk     3.4          3.3Average Risk          5.0          4.42X Average Risk          9.6          7.13X Average Risk          15.0          11.0                      ? NonHDL 08/08/2021 95.02   Final  ? NOTE:  Non-HDL goal should be 30 mg/dL higher than patient's LDL goal (i.e. LDL goal of < 70 mg/dL, would have non-HDL goal of < 100 mg/dL)  ? Sodium 08/08/2021 139  135 - 145 mEq/L Final  ? Potassium 08/08/2021 4.9  3.5 - 5.1 mEq/L Final  ? Chloride 08/08/2021 105  96 - 112 mEq/L Final  ? CO2 08/08/2021 26  19 - 32 mEq/L Final  ? Glucose, Bld 08/08/2021 123 (H)  70 - 99 mg/dL Final  ? BUN 08/08/2021 15  6 - 23 mg/dL Final  ? Creatinine, Ser 08/08/2021 0.92  0.40 - 1.50 mg/dL Final  ? GFR 08/08/2021 96.93  >60.00 mL/min Final  ? Calculated using the CKD-EPI  Creatinine Equation (2021)  ? Calcium 08/08/2021 9.4  8.4 - 10.5 mg/dL Final  ? Hgb A1c MFr Bld 08/08/2021 7.0 (H)  4.6 - 6.5 % Final  ? Glycemic Control Guidelines for People with Diabetes:Non Diabetic:  <6%Goal of Therapy: <7%Additional Action Suggested:  >8%   ? ? ? ?Allergies as of 08/12/2021   ?No Known Allergies ?  ? ?  ?Medication List  ?  ? ?  ? Accurate as of August 12, 2021  1:45 PM. If you have any questions, ask your nurse or doctor.  ?  ?  ? ?  ? ?Accu-Chek [employer] Kit ?Use as instructed to check blood sugar daily. ?  ?Accu-Chek FastClix Lancets Misc ?Use as instructed to check blood sugar daily. ?  ?Farxiga 10 MG Tabs tablet ?Generic drug: dapagliflozin propanediol ?TAKE 1 TABLET BY MOUTH EVERY DAY ?  ?FreeStyle Freedom Lite w/Device Kit ?Use as instructed to check blood sugar once daily. ?  ?Janumet XR 50-1000 MG Tb24 ?Generic drug: SitaGLIPtin-MetFORMIN HCl ?TAKE 2 TABLETS BY MOUTH DAILY ?  ?Pfizer COVID-19 Vac Bivalent injection ?Generic drug: COVID-19 mRNA bivalent vaccine [employer]) ?Inject into the muscle. ?  ?pioglitazone 30 MG tablet ?Commonly known as: ACTOS ?Take 1 tablet (30 mg total) by mouth daily. ?  ?rosuvastatin 20 MG tablet ?Commonly known as: CRESTOR ?TAKE 1 TABLET BY MOUTH ONCE A DAY ?  ? ?  ? ? ? ? ?Allergies: No Known Allergies ? ?Past Medical History:  ?Diagnosis Date  ? Diabetes mellitus   ? Hyperlipidemia   ? Normal cardiac stress test 02-2011   ? Renal cyst, right   ? Complex, Dr. Louis Meckel  ? Urolithiasis 11-2013  ? L  ? ? ?  Past Surgical History:  ?Procedure Laterality Date  ? INCISE AND DRAIN ABCESS  07/11/11  ? perirectal abscess   ? ? ?Family History  ?Problem Relation Age of Onset  ? Breast cancer Mother   ? Sudden death Brother 53  ?     MI ?  Passed away on his sleep  ? Hypertension Neg Hx   ? Hyperlipidemia Neg Hx   ? Heart attack Neg Hx   ? Diabetes Neg Hx   ? Colon cancer Neg Hx   ? Prostate cancer Neg Hx   ? ? ?Social History:  reports that he quit smoking about 22  years ago. His smoking use included cigarettes. He has never used smokeless tobacco. He reports current alcohol use. He reports that he does not use drugs. ? ?  ?Review of Systems  ? ?No history of hypertension  ? ?    Lipids: He has been treated with Crestor 20 mg ?Baseline LDL as high as 178 ?Has a positive history of sudden death in his family with his brother dying at the age of 42  ? ?Lipid results: ? ? ?Lab Results  ?Component Value Date  ? CHOL 145 08/08/2021  ? CHOL 147 12/27/2020  ? CHOL 154 07/04/2020  ? ?Lab Results  ?Component Value Date  ? HDL 50.30 08/08/2021  ? HDL 50.50 12/27/2020  ? HDL 48.90 07/04/2020  ? ?Lab Results  ?Component Value Date  ? [place] 81 08/08/2021  ? [place] 77 12/27/2020  ? [place] 87 07/04/2020  ? ?Lab Results  ?Component Value Date  ? TRIG 72.0 08/08/2021  ? TRIG 97.0 12/27/2020  ? TRIG 89.0 07/04/2020  ? ?Lab Results  ?Component Value Date  ? CHOLHDL 3 08/08/2021  ? CHOLHDL 3 12/27/2020  ? CHOLHDL 3 07/04/2020  ? ?Lab Results  ?Component Value Date  ? LDLDIRECT 178.1 04/20/2013  ? LDLDIRECT 173.6 05/14/2012  ? LDLDIRECT 119.7 02/27/2011  ? ? ? ? ?LABS: ? ?Lab on 08/08/2021  ?Component Date Value Ref Range Status  ? Cholesterol 08/08/2021 145  0 - 200 mg/dL Final  ? ATP III Classification       Desirable:  < 200 mg/dL               Borderline High:  200 - 239 mg/dL          High:  > = 240 mg/dL  ? Triglycerides 08/08/2021 72.0  0.0 - 149.0 mg/dL Final  ? Normal:  <150 mg/dLBorderline High:  150 - 199 mg/dL  ? HDL 08/08/2021 50.30  >39.00 mg/dL Final  ? VLDL 08/08/2021 14.4  0.0 - 40.0 mg/dL Final  ? LDL Cholesterol 08/08/2021 81  0 - 99 mg/dL Final  ? Total CHOL/HDL Ratio 08/08/2021 3   Final  ?                Men          Women1/2 Average Risk     3.4          3.3Average Risk          5.0          4.42X Average Risk          9.6          7.13X Average Risk          15.0          11.0                      ?  NonHDL 08/08/2021 95.02   Final  ? NOTE:  Non-HDL goal should be 30  mg/dL higher than patient's LDL goal (i.e. LDL goal of < 70 mg/dL, would have non-HDL goal of < 100 mg/dL)  ? Sodium 08/08/2021 139  135 - 145 mEq/L Final  ? Potassium 08/08/2021 4.9  3.5 - 5.1 mEq/L Final  ? Chloride 03/30

## 2021-09-04 ENCOUNTER — Other Ambulatory Visit: Payer: Self-pay | Admitting: Endocrinology

## 2021-10-30 ENCOUNTER — Other Ambulatory Visit: Payer: Self-pay | Admitting: Endocrinology

## 2021-11-01 IMAGING — MR MR FOOT*L* W/O CM
5 series · 32 of 40 positions shown · non-contrast
Comparison: Ankle x-rays dated November 16, 2019.

CLINICAL DATA: Persistent lateral ankle pain since inversion injury
while hiking on [DATE]. No prior surgery.

EXAM:
MRI OF THE LEFT FOOT WITHOUT CONTRAST
TECHNIQUE: Multiplanar, multisequence MR imaging of the left ankle was
performed. No intravenous contrast was administered.

[Series 3: T2 fat-sat · axial · 3.0mm · 0.66mm/px · z∈[-99,+51]mm · 8 of 43 slices shown (1 of 2)]
[im 1/43]
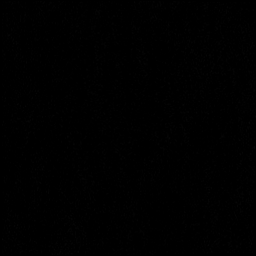
[im 5/43]
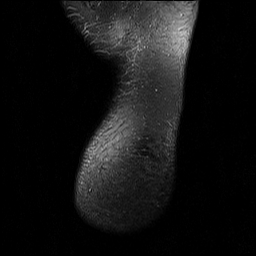
[im 15/43]
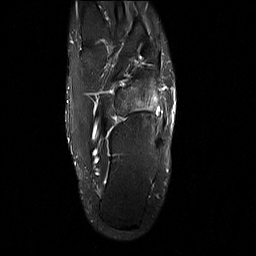
[im 19/43]
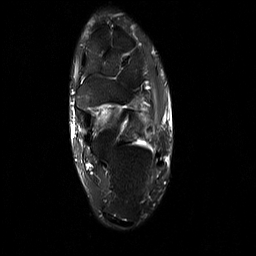
[im 24/43]
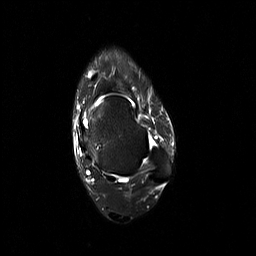
[im 29/43]
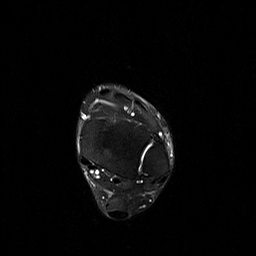
[im 38/43]
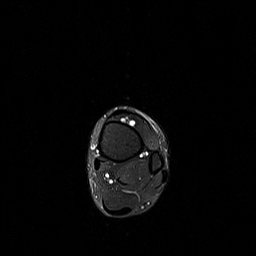
[im 43/43]
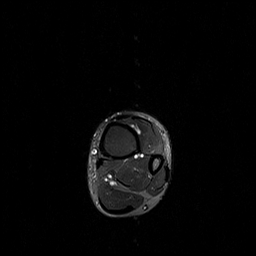

[Series 4: PD fat-sat · axial · 3.0mm · 0.66mm/px · z∈[-99,+51]mm · 8 of 43 slices shown]
[im 1/43]
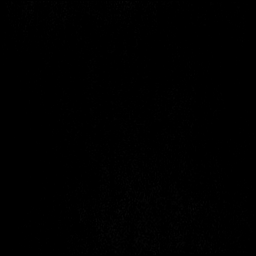
[im 5/43]
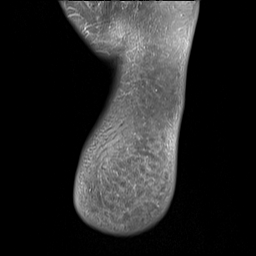
[im 15/43]
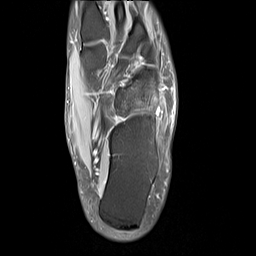
[im 19/43]
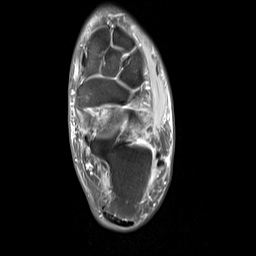
[im 24/43]
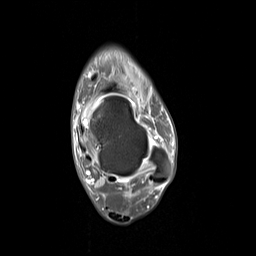
[im 29/43]
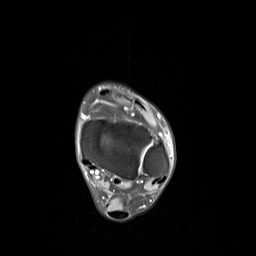
[im 38/43]
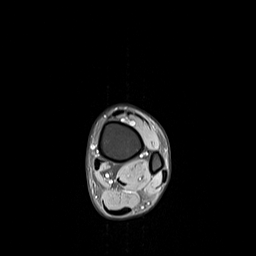
[im 43/43]
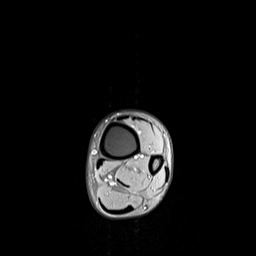

[Series 5: T1 · sagittal · 3.0mm · 0.31mm/px · 5 of 24 slices shown]
[im 1/24]
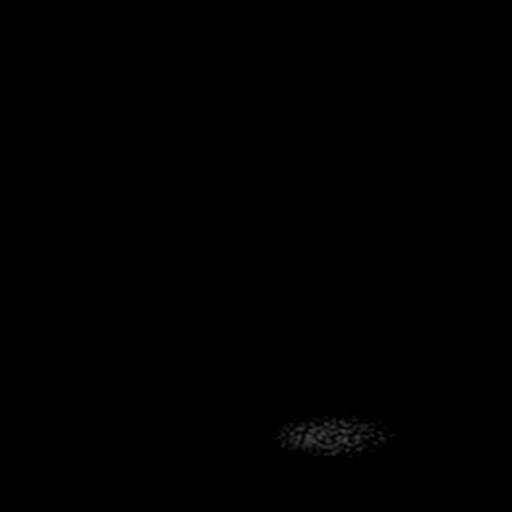
[im 6/24]
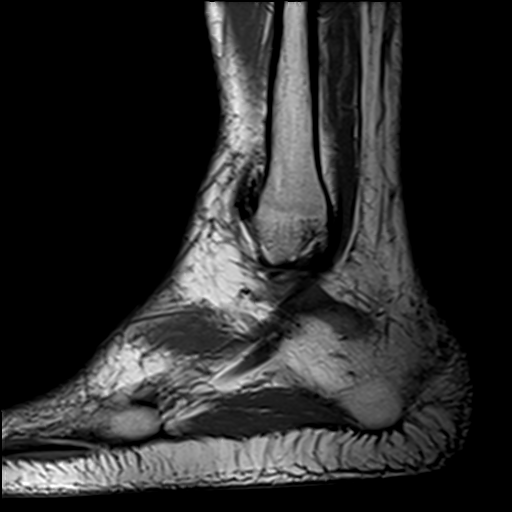
[im 12/24]
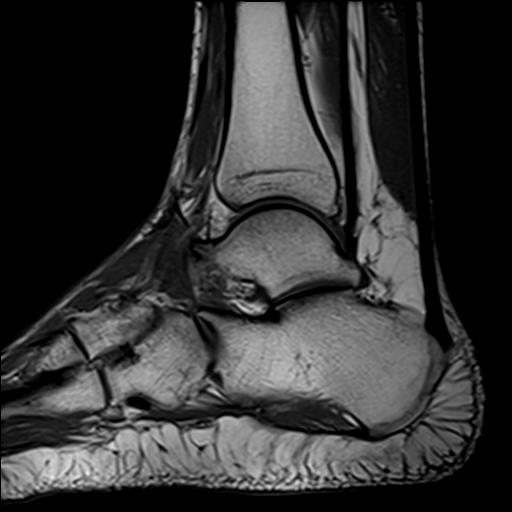
[im 18/24]
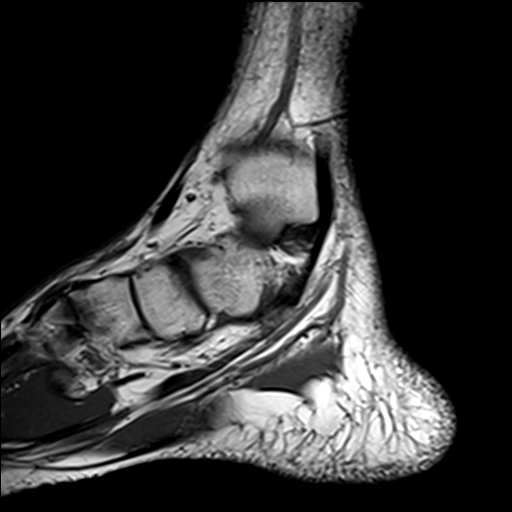
[im 24/24]
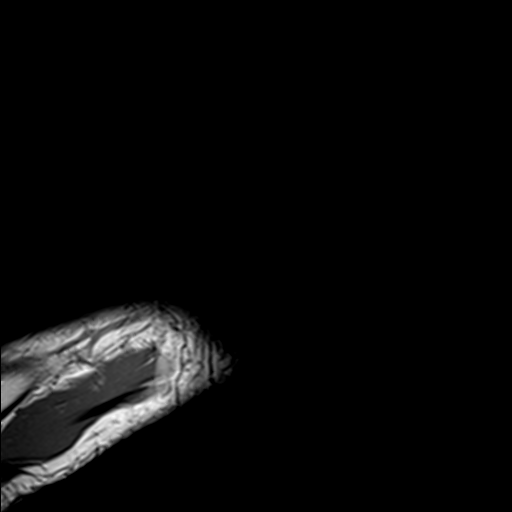

[Series 6: STIR · sagittal · 3.0mm · 0.62mm/px · 3 of 24 slices shown]
[im 1/24]
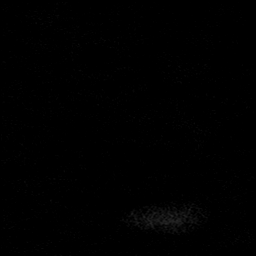
[im 6/24]
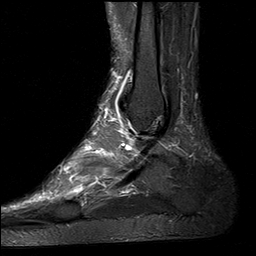
[im 12/24]
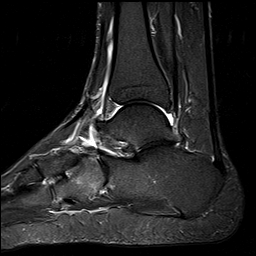

[Series 7: T2 fat-sat · coronal · 3.0mm · 0.31mm/px · 8 of 45 slices shown (2 of 2)]
[im 1/45]
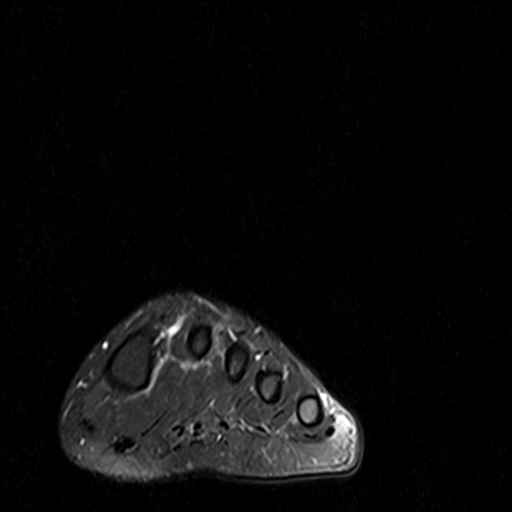
[im 5/45]
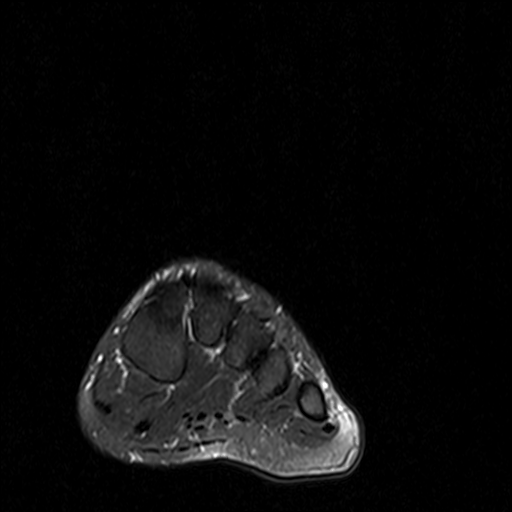
[im 15/45]
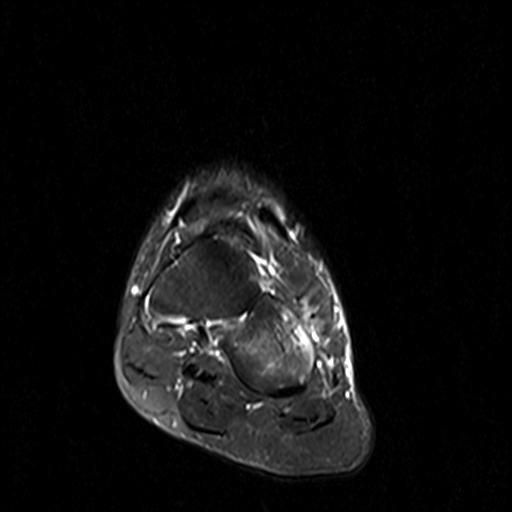
[im 20/45]
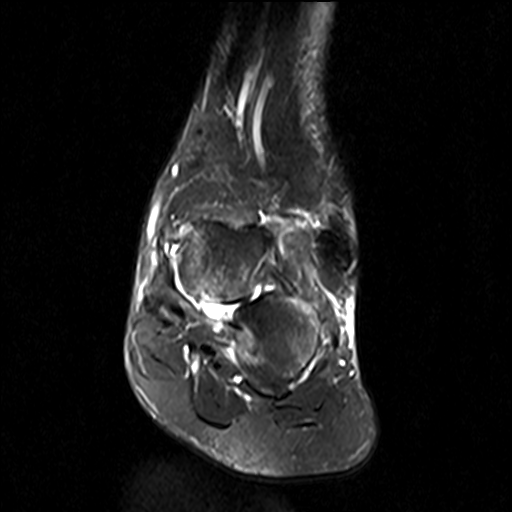
[im 25/45]
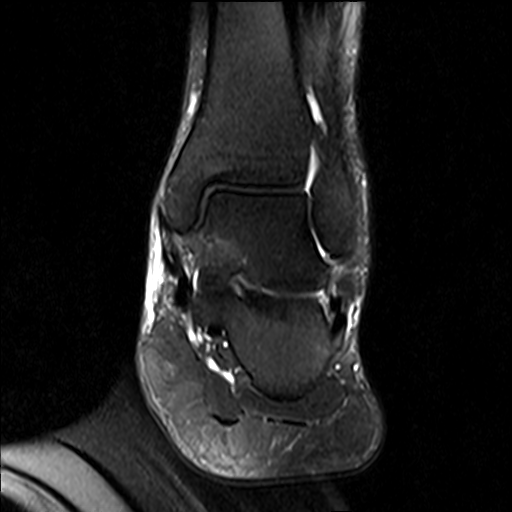
[im 30/45]
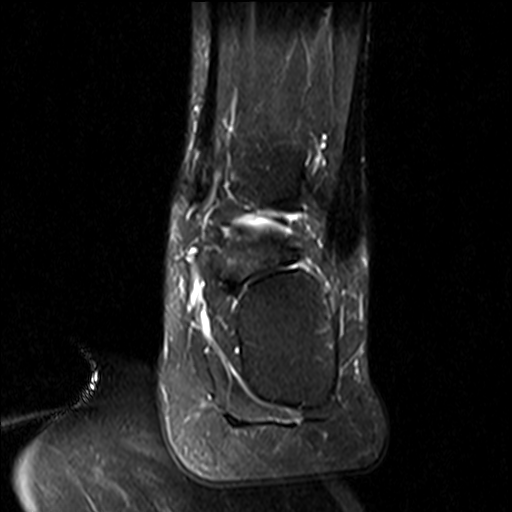
[im 40/45]
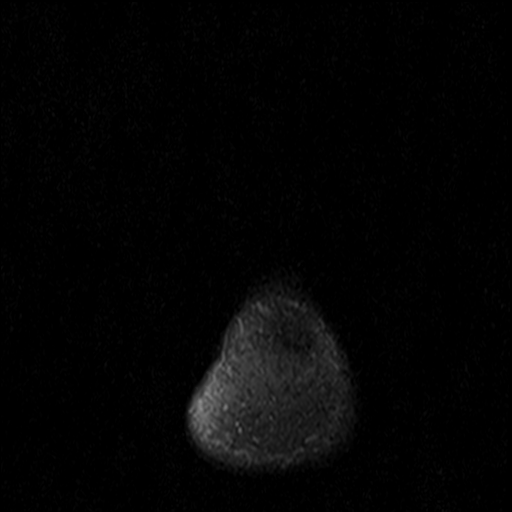
[im 45/45]
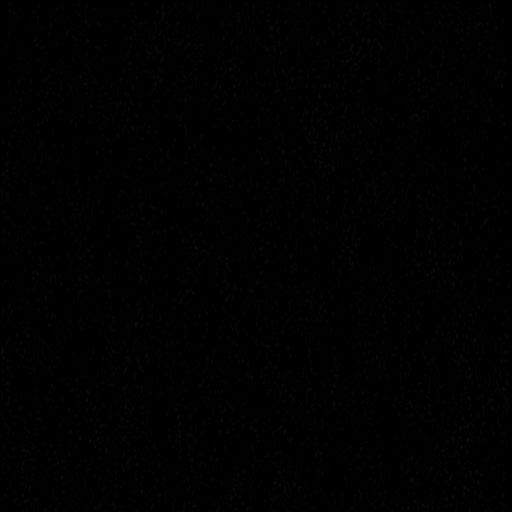

[32 of 40 positions shown; findings below may reference images not displayed]

FINDINGS: TENDONS

Peroneal: Peroneal longus tendon intact. Peroneal brevis intact.

Posteromedial: Posterior tibial tendon intact. Flexor hallucis
longus tendon intact. Flexor digitorum longus tendon intact.

Anterior: Tibialis anterior tendon intact. Extensor hallucis longus
tendon intact Extensor digitorum longus tendon intact.

Achilles:  Intact.

Plantar Fascia: Intact.

LIGAMENTS

Lateral: Anterior talofibular ligament intact. Calcaneofibular
ligament intact. Posterior talofibular ligament intact. Anterior and
posterior tibiofibular ligaments intact.

Medial: Deltoid ligament intact. Spring ligament intact.

CARTILAGE

Ankle Joint: No joint effusion. Normal ankle mortise. No chondral
defect.

Subtalar Joints/Sinus Tarsi: Normal subtalar joints. No subtalar
joint effusion. Mild loss of the normal fat signal within the sinus
tarsi.

Bones: Small avulsion fracture of the anterolateral corner of the
calcaneus with underlying marrow edema. Focal marrow edema in the
lateral aspect of the cuboid. Mild marrow edema in the inferomedial
talar head. No dislocation. No suspicious bone lesion.

Soft Tissue: Mild soft tissue swelling lateral to the calcaneocuboid
joint. No soft tissue mass or fluid collection.
IMPRESSION: 1. Small avulsion fracture of the anterolateral corner of the
calcaneus at the origin of the extensor digitorum brevis tendon.
2. Contusions of the cuboid and inferomedial talar head.

## 2021-11-16 ENCOUNTER — Other Ambulatory Visit: Payer: Self-pay | Admitting: Endocrinology

## 2021-11-16 DIAGNOSIS — E785 Hyperlipidemia, unspecified: Secondary | ICD-10-CM

## 2021-11-16 DIAGNOSIS — E1165 Type 2 diabetes mellitus with hyperglycemia: Secondary | ICD-10-CM

## 2021-12-09 ENCOUNTER — Other Ambulatory Visit (INDEPENDENT_AMBULATORY_CARE_PROVIDER_SITE_OTHER): Payer: BC Managed Care – PPO

## 2021-12-09 DIAGNOSIS — E1165 Type 2 diabetes mellitus with hyperglycemia: Secondary | ICD-10-CM | POA: Diagnosis not present

## 2021-12-09 LAB — BASIC METABOLIC PANEL
BUN: 15 mg/dL (ref 6–23)
CO2: 24 mEq/L (ref 19–32)
Calcium: 9 mg/dL (ref 8.4–10.5)
Chloride: 105 mEq/L (ref 96–112)
Creatinine, Ser: 0.94 mg/dL (ref 0.40–1.50)
GFR: 94.24 mL/min (ref >60.00)
Glucose, Bld: 126 mg/dL — ABNORMAL HIGH (ref 70–99)
Potassium: 4.8 mEq/L (ref 3.5–5.1)
Sodium: 140 mEq/L (ref 135–145)

## 2021-12-09 LAB — MICROALBUMIN / CREATININE URINE RATIO
Creatinine,U: 60.5 mg/dL
Microalb Creat Ratio: 1.2 mg/g (ref 0.0–30.0)
Microalb, Ur: <0.7 mg/dL (ref 0.0–1.9)

## 2021-12-09 LAB — HEMOGLOBIN A1C: Hgb A1c MFr Bld: 7 % — ABNORMAL HIGH (ref 4.6–6.5)

## 2021-12-13 ENCOUNTER — Ambulatory Visit (INDEPENDENT_AMBULATORY_CARE_PROVIDER_SITE_OTHER): Payer: BC Managed Care – PPO | Admitting: Endocrinology

## 2021-12-13 ENCOUNTER — Encounter: Payer: Self-pay | Admitting: Endocrinology

## 2021-12-13 VITALS — BP 112/76 | HR 68 | Ht 65.0 in | Wt 185.0 lb

## 2021-12-13 DIAGNOSIS — E785 Hyperlipidemia, unspecified: Secondary | ICD-10-CM

## 2021-12-13 DIAGNOSIS — E1165 Type 2 diabetes mellitus with hyperglycemia: Secondary | ICD-10-CM

## 2021-12-13 MED ORDER — ONETOUCH DELICA LANCETS 33G MISC: 3 refills | Status: DC

## 2021-12-13 MED ORDER — ONETOUCH VERIO VI STRP: ORAL_STRIP | 3 refills | Status: DC

## 2021-12-13 NOTE — Patient Instructions (Addendum)
Check blood sugars on waking up 2-3 days a week  Also check blood sugars about 2 hours after meals and do this after different meals by rotation  Recommended blood sugar levels on waking up are 90-130 and about 2 hours after meal is 130-160  Please bring your blood sugar monitor to each visit, thank you  Protein added to each meal  Get eye exam  Restart exercise

## 2021-12-13 NOTE — Progress Notes (Signed)
Shawn Walter 51 y.o.              Reason for Appointment : Followup of diabetes and lipids  History of Present Illness          Diagnosis: Type 2 diabetes mellitus, date of diagnosis: 2004        Past history: His glucose was about 220 at diagnosis. He thinks he was not treated with medications but told to modify his diet and start exercise. Not clear how his diabetes was controlled with this regimen He thinks about 3 years ago he was started on metformin, probably when his A1c was 8.2%. Blood sugar was somewhat better but his A1c at best was 7.1% Although he had some initial improvement with metformin initially his blood sugars have been generally higher this year especially in 1/14 At some point he was also given Amaryl 4 mg but this was stopped when he was having low blood sugars Prior to his initial consultation he had been totally noncompliant with diet, exercise and glucose monitoring. He  probably gained weight also. He was on Janumet and Actos but his glucose on the lab was 348 along with A1c of 9.5  Recent history:   Treatment regimen: Janumet 50/1000 XR twice a day and Actos 30 mg daily, Farxiga 10 mg daily  A1c is at 7  Current problems, management and blood sugar levels: His A1c had been above 7% since 2018 but is consistently at 7%  He has not checked his blood sugars, he thinks his blood sugar meter is not working well  Lab glucose was 126 fasting  He has gained another 2 pounds  He says that he will having some knee pain for about a month and has not exercised  He feels like he is cutting back on portions However sometimes will only have watermelon for breakfast  No side effects from Janumet and no edema with Actos    Glucose monitoring:  less than once a day       Glucometer: Accu-Chek    Blood Glucose readings not available  Dietician visit: Most recent: At the time of diagnosis                  Wt Readings from Last 3 Encounters:  12/13/21 185 lb  (83.9 kg)  08/12/21 183 lb (83 kg)  05/17/21 175 lb 8 oz (79.6 kg)    Lab Results  Component Value Date   HGBA1C 7.0 (H) 12/09/2021   HGBA1C 7.0 (H) 08/08/2021   HGBA1C 7.3 (H) 04/12/2021   Lab Results  Component Value Date   MICROALBUR <0.7 12/09/2021   LDLCALC 81 08/08/2021   CREATININE 0.94 12/09/2021   Lab on 12/09/2021  Component Date Value Ref Range Status   Microalb, Ur 12/09/2021 <0.7  0.0 - 1.9 mg/dL Final   Creatinine,U 12/09/2021 60.5  mg/dL Final   Microalb Creat Ratio 12/09/2021 1.2  0.0 - 30.0 mg/g Final   Sodium 12/09/2021 140  135 - 145 mEq/L Final   Potassium 12/09/2021 4.8  3.5 - 5.1 mEq/L Final   Chloride 12/09/2021 105  96 - 112 mEq/L Final   CO2 12/09/2021 24  19 - 32 mEq/L Final   Glucose, Bld 12/09/2021 126 (H)  70 - 99 mg/dL Final   BUN 12/09/2021 15  6 - 23 mg/dL Final   Creatinine, Ser 12/09/2021 0.94  0.40 - 1.50 mg/dL Final   GFR 12/09/2021 94.24  >60.00 mL/min Final   Calculated using  the CKD-EPI Creatinine Equation (2021)   Calcium 12/09/2021 9.0  8.4 - 10.5 mg/dL Final   Hgb A1c MFr Bld 12/09/2021 7.0 (H)  4.6 - 6.5 % Final   Glycemic Control Guidelines for People with Diabetes:Non Diabetic:  <6%Goal of Therapy: <7%Additional Action Suggested:  >8%      Allergies as of 12/13/2021   No Known Allergies      Medication List        Accurate as of December 13, 2021  8:52 AM. If you have any questions, ask your nurse or doctor.          Accu-Chek [employer] Kit Use as instructed to check blood sugar daily.   Accu-Chek [employer] Lancets Misc Use as instructed to check blood sugar daily.   Farxiga 10 MG Tabs tablet Generic drug: dapagliflozin propanediol TAKE 1 TABLET BY MOUTH EVERY DAY   FreeStyle Freedom Lite w/Device Kit Use as instructed to check blood sugar once daily.   Janumet XR 50-1000 MG Tb24 Generic drug: SitaGLIPtin-MetFORMIN HCl TAKE 2 TABLETS BY MOUTH DAILY   Pfizer COVID-19 Vac Bivalent injection Generic drug:  COVID-19 mRNA bivalent vaccine (Pfizer) Inject into the muscle.   pioglitazone 30 MG tablet Commonly known as: ACTOS TAKE 1 TABLET BY MOUTH EVERY DAY   rosuvastatin 20 MG tablet Commonly known as: CRESTOR TAKE 1 TABLET BY MOUTH EVERY DAY          Allergies: No Known Allergies  Past Medical History:  Diagnosis Date   Diabetes mellitus    Hyperlipidemia    Normal cardiac stress test 02-2011    Renal cyst, right    Complex, Dr. Louis Meckel   Urolithiasis 11-2013   L    Past Surgical History:  Procedure Laterality Date   INCISE AND DRAIN ABCESS  07/11/11   perirectal abscess     Family History  Problem Relation Age of Onset   Breast cancer Mother    Sudden death Brother 35       MI ?  Passed away on his sleep   Hypertension Neg Hx    Hyperlipidemia Neg Hx    Heart attack Neg Hx    Diabetes Neg Hx    Colon cancer Neg Hx    Prostate cancer Neg Hx     Social History:  reports that he quit smoking about 22 years ago. His smoking use included cigarettes. He has never used smokeless tobacco. He reports current alcohol use. He reports that he does not use drugs.    Review of Systems   No history of hypertension       Lipids: He has been treated with Crestor 20 mg Baseline LDL as high as 178 Has a positive history of sudden death in his family with his brother dying at the age of 34   Lipid results:   Lab Results  Component Value Date   CHOL 145 08/08/2021   CHOL 147 12/27/2020   CHOL 154 07/04/2020   Lab Results  Component Value Date   HDL 50.30 08/08/2021   HDL 50.50 12/27/2020   HDL 48.90 07/04/2020   Lab Results  Component Value Date   LDLCALC 81 08/08/2021   LDLCALC 77 12/27/2020   LDLCALC 87 07/04/2020   Lab Results  Component Value Date   TRIG 72.0 08/08/2021   TRIG 97.0 12/27/2020   TRIG 89.0 07/04/2020   Lab Results  Component Value Date   CHOLHDL 3 08/08/2021   CHOLHDL 3 12/27/2020   CHOLHDL 3 07/04/2020  Lab Results  Component  Value Date   LDLDIRECT 178.1 04/20/2013   LDLDIRECT 173.6 05/14/2012   LDLDIRECT 119.7 02/27/2011   He is due for his eye exam   LABS:  Lab on 12/09/2021  Component Date Value Ref Range Status   Microalb, Ur 12/09/2021 <0.7  0.0 - 1.9 mg/dL Final   Creatinine,U 12/09/2021 60.5  mg/dL Final   Microalb Creat Ratio 12/09/2021 1.2  0.0 - 30.0 mg/g Final   Sodium 12/09/2021 140  135 - 145 mEq/L Final   Potassium 12/09/2021 4.8  3.5 - 5.1 mEq/L Final   Chloride 12/09/2021 105  96 - 112 mEq/L Final   CO2 12/09/2021 24  19 - 32 mEq/L Final   Glucose, Bld 12/09/2021 126 (H)  70 - 99 mg/dL Final   BUN 12/09/2021 15  6 - 23 mg/dL Final   Creatinine, Ser 12/09/2021 0.94  0.40 - 1.50 mg/dL Final   GFR 12/09/2021 94.24  >60.00 mL/min Final   Calculated using the CKD-EPI Creatinine Equation (2021)   Calcium 12/09/2021 9.0  8.4 - 10.5 mg/dL Final   Hgb A1c MFr Bld 12/09/2021 7.0 (H)  4.6 - 6.5 % Final   Glycemic Control Guidelines for People with Diabetes:Non Diabetic:  <6%Goal of Therapy: <7%Additional Action Suggested:  >8%     Physical Examination:  BP 112/76   Pulse 68   Ht '5\' 5"'  (1.651 m)   Wt 185 lb (83.9 kg)   SpO2 98%   BMI 30.79 kg/m      ASSESSMENT:  Diabetes type 2 with mild obesity  See history of present illness for discussion of current diabetes management, blood sugar patterns and problems identified  His A1c stable at 7% He is on a 3 drug regimen of Janumet, Actos and [place]  Again he has not checked his blood sugars or exercise consistently Weight has gone up 2 pounds Lab fasting glucose 126  Urine microalbumin normal  Plan: Start monitoring blood sugar with the new One Touch Verio monitor that was given today Discussed importance of checking after meals Start regular aerobic exercise and also some weight, need to do this consistently at least 4 to 5 days a week Discussed blood sugar targets after meals We will check lipids on the next visit      There are no Patient Instructions on file for this visit.    Elayne Snare 12/13/2021, 8:52 AM

## 2021-12-13 NOTE — Addendum Note (Signed)
Addended by: Eliseo Squires on: 12/13/2021 09:41 AM   Modules accepted: Orders

## 2021-12-15 ENCOUNTER — Other Ambulatory Visit: Payer: Self-pay | Admitting: Endocrinology

## 2022-01-02 ENCOUNTER — Encounter: Payer: Self-pay | Admitting: Internal Medicine

## 2022-01-03 ENCOUNTER — Encounter: Payer: Self-pay | Admitting: Internal Medicine

## 2022-02-19 ENCOUNTER — Other Ambulatory Visit: Payer: Self-pay | Admitting: Endocrinology

## 2022-02-19 DIAGNOSIS — E1165 Type 2 diabetes mellitus with hyperglycemia: Secondary | ICD-10-CM

## 2022-02-19 DIAGNOSIS — E785 Hyperlipidemia, unspecified: Secondary | ICD-10-CM

## 2022-05-13 ENCOUNTER — Other Ambulatory Visit (INDEPENDENT_AMBULATORY_CARE_PROVIDER_SITE_OTHER): Payer: Managed Care, Other (non HMO)

## 2022-05-13 DIAGNOSIS — E785 Hyperlipidemia, unspecified: Secondary | ICD-10-CM | POA: Diagnosis not present

## 2022-05-13 DIAGNOSIS — E1165 Type 2 diabetes mellitus with hyperglycemia: Secondary | ICD-10-CM | POA: Diagnosis not present

## 2022-05-13 LAB — COMPREHENSIVE METABOLIC PANEL
ALT: 28 U/L (ref 0–53)
AST: 25 U/L (ref 0–37)
Albumin: 4.3 g/dL (ref 3.5–5.2)
Alkaline Phosphatase: 64 U/L (ref 39–117)
BUN: 15 mg/dL (ref 6–23)
CO2: 26 mEq/L (ref 19–32)
Calcium: 9.7 mg/dL (ref 8.4–10.5)
Chloride: 105 mEq/L (ref 96–112)
Creatinine, Ser: 0.98 mg/dL (ref 0.40–1.50)
GFR: 89.38 mL/min (ref >60.00)
Glucose, Bld: 151 mg/dL — ABNORMAL HIGH (ref 70–99)
Potassium: 4.8 mEq/L (ref 3.5–5.1)
Sodium: 140 mEq/L (ref 135–145)
Total Bilirubin: 0.5 mg/dL (ref 0.2–1.2)
Total Protein: 7.8 g/dL (ref 6.0–8.3)

## 2022-05-13 LAB — LIPID PANEL
Cholesterol: 142 mg/dL (ref 0–200)
HDL: 48.3 mg/dL (ref >39.00)
LDL Cholesterol: 75 mg/dL (ref 0–99)
NonHDL: 93.7
Total CHOL/HDL Ratio: 3
Triglycerides: 93 mg/dL (ref 0.0–149.0)
VLDL: 18.6 mg/dL (ref 0.0–40.0)

## 2022-05-13 LAB — HEMOGLOBIN A1C: Hgb A1c MFr Bld: 6.5 % (ref 4.6–6.5)

## 2022-05-15 ENCOUNTER — Ambulatory Visit: Payer: Managed Care, Other (non HMO) | Admitting: Endocrinology

## 2022-05-15 ENCOUNTER — Encounter: Payer: Self-pay | Admitting: Endocrinology

## 2022-05-15 VITALS — BP 108/72 | HR 80 | Ht 65.0 in | Wt 173.0 lb

## 2022-05-15 DIAGNOSIS — E785 Hyperlipidemia, unspecified: Secondary | ICD-10-CM | POA: Diagnosis not present

## 2022-05-15 DIAGNOSIS — E1165 Type 2 diabetes mellitus with hyperglycemia: Secondary | ICD-10-CM | POA: Diagnosis not present

## 2022-05-15 MED ORDER — XIGDUO XR 5-1000 MG PO TB24: 1.0000 | ORAL_TABLET | Freq: Every day | ORAL | 3 refills | Status: DC

## 2022-05-15 NOTE — Progress Notes (Signed)
Shawn Walter 52 y.o.              Reason for Appointment : Followup of diabetes and lipids  History of Present Illness          Diagnosis: Type 2 diabetes mellitus, date of diagnosis: 2004        Past history: His glucose was about 220 at diagnosis. He thinks he was not treated with medications but told to modify his diet and start exercise. Not clear how his diabetes was controlled with this regimen He thinks about 3 years ago he was started on metformin, probably when his A1c was 8.2%. Blood sugar was somewhat better but his A1c at best was 7.1% Although he had some initial improvement with metformin initially his blood sugars have been generally higher this year especially in 1/14 At some point he was also given Amaryl 4 mg but this was stopped when he was having low blood sugars Prior to his initial consultation he had been totally noncompliant with diet, exercise and glucose monitoring. He  probably gained weight also. He was on Janumet and Actos but his glucose on the lab was 348 along with A1c of 9.5  Recent history:   Treatment regimen: Janumet 50/1000 XR once a day  Previously also on Actos 30 mg daily, Farxiga 10 mg daily  A1c is 6.5 compared to 7  Current problems, management and blood sugar levels: He has finally started working on his lifestyle and changing his diet  significantly since after his last visit He was also given a new glucose meter to monitor his sugars However he says that since his sugars were fairly good he stopped taking Actos and Farxiga on his own and reduced his Janumet to once a day without letting us know However in the last month has checked his blood sugar only 3 times but not after lunch or dinner He is walking on his treadmill a couple of times a week only Lab glucose was 151 nonfasting in the morning He generally says that he is cutting back on carbohydrates and eliminating rice  Glucose monitoring:  less than once a day        Glucometer: Accu-Chek    Blood Glucose readings: Only 3 available in the morning hours ranging from 117-146  Previously not available  Dietician visit: Most recent: At the time of diagnosis                  Wt Readings from Last 3 Encounters:  05/15/22 184 lb (83.5 kg)  12/13/21 185 lb (83.9 kg)  08/12/21 183 lb (83 kg)    Lab Results  Component Value Date   HGBA1C 6.5 05/13/2022   HGBA1C 7.0 (H) 12/09/2021   HGBA1C 7.0 (H) 08/08/2021   Lab Results  Component Value Date   MICROALBUR <0.7 12/09/2021   LDLCALC 75 05/13/2022   CREATININE 0.98 05/13/2022   Lab on 05/13/2022  Component Date Value Ref Range Status   Cholesterol 05/13/2022 142  0 - 200 mg/dL Final   ATP III Classification       Desirable:  < 200 mg/dL               Borderline High:  200 - 239 mg/dL          High:  > = 240 mg/dL   Triglycerides 05/13/2022 93.0  0.0 - 149.0 mg/dL Final   Normal:  <150 mg/dLBorderline High:  150 - 199 mg/dL   HDL 05/13/2022  48.30  >39.00 mg/dL Final   VLDL 05/13/2022 18.6  0.0 - 40.0 mg/dL Final   LDL Cholesterol 05/13/2022 75  0 - 99 mg/dL Final   Total CHOL/HDL Ratio 05/13/2022 3   Final                  Men          Women1/2 Average Risk     3.4          3.3Average Risk          5.0          4.42X Average Risk          9.6          7.13X Average Risk          15.0          11.0                       NonHDL 05/13/2022 93.70   Final   NOTE:  Non-HDL goal should be 30 mg/dL higher than patient's LDL goal (i.e. LDL goal of < 70 mg/dL, would have non-HDL goal of < 100 mg/dL)   Sodium 05/13/2022 140  135 - 145 mEq/L Final   Potassium 05/13/2022 4.8  3.5 - 5.1 mEq/L Final   Chloride 05/13/2022 105  96 - 112 mEq/L Final   CO2 05/13/2022 26  19 - 32 mEq/L Final   Glucose, Bld 05/13/2022 151 (H)  70 - 99 mg/dL Final   BUN 05/13/2022 15  6 - 23 mg/dL Final   Creatinine, Ser 05/13/2022 0.98  0.40 - 1.50 mg/dL Final   Total Bilirubin 05/13/2022 0.5  0.2 - 1.2 mg/dL Final   Alkaline  Phosphatase 05/13/2022 64  39 - 117 U/L Final   AST 05/13/2022 25  0 - 37 U/L Final   ALT 05/13/2022 28  0 - 53 U/L Final   Total Protein 05/13/2022 7.8  6.0 - 8.3 g/dL Final   Albumin 05/13/2022 4.3  3.5 - 5.2 g/dL Final   GFR 05/13/2022 89.38  >60.00 mL/min Final   Calculated using the CKD-EPI Creatinine Equation (2021)   Calcium 05/13/2022 9.7  8.4 - 10.5 mg/dL Final   Hgb A1c MFr Bld 05/13/2022 6.5  4.6 - 6.5 % Final   Glycemic Control Guidelines for People with Diabetes:Non Diabetic:  <6%Goal of Therapy: <7%Additional Action Suggested:  >8%      Allergies as of 05/15/2022   No Known Allergies      Medication List        Accurate as of May 15, 2022  9:25 AM. If you have any questions, ask your nurse or doctor.          Accu-Chek [employer] Kit Use as instructed to check blood sugar daily.   Farxiga 10 MG Tabs tablet Generic drug: dapagliflozin propanediol TAKE 1 TABLET BY MOUTH EVERY DAY   FreeStyle Freedom Lite w/Device Kit Use as instructed to check blood sugar once daily.   Janumet XR 50-1000 MG Tb24 Generic drug: SitaGLIPtin-MetFORMIN HCl TAKE 2 TABLETS BY MOUTH DAILY   OneTouch [employer] Lancets 49Q Misc Use to check blood sugar once daily   OneTouch Verio test strip Generic drug: glucose blood Use to check blood sugar once a day   Pfizer COVID-19 Vac Bivalent injection Generic drug: COVID-19 mRNA bivalent vaccine (Pfizer) Inject into the muscle.   pioglitazone 30 MG tablet Commonly known as: ACTOS TAKE 1 TABLET  BY MOUTH EVERY DAY   rosuvastatin 20 MG tablet Commonly known as: CRESTOR TAKE 1 TABLET BY MOUTH EVERY DAY          Allergies: No Known Allergies  Past Medical History:  Diagnosis Date   Diabetes mellitus    Hyperlipidemia    Normal cardiac stress test 02-2011    Renal cyst, right    Complex, Dr. Louis Meckel   Urolithiasis 11-2013   L    Past Surgical History:  Procedure Laterality Date   INCISE AND DRAIN ABCESS  07/11/11    perirectal abscess     Family History  Problem Relation Age of Onset   Breast cancer Mother    Sudden death Brother 2       MI ?  Passed away on his sleep   Hypertension Neg Hx    Hyperlipidemia Neg Hx    Heart attack Neg Hx    Diabetes Neg Hx    Colon cancer Neg Hx    Prostate cancer Neg Hx     Social History:  reports that he quit smoking about 23 years ago. His smoking use included cigarettes. He has never used smokeless tobacco. He reports current alcohol use. He reports that he does not use drugs.    Review of Systems   No history of hypertension       Lipids: He has been treated with Crestor 20 mg Baseline LDL as high as 178 Has a positive history of sudden death in his family with his brother dying at the age of 80   Lipid results:   Lab Results  Component Value Date   CHOL 142 05/13/2022   CHOL 145 08/08/2021   CHOL 147 12/27/2020   Lab Results  Component Value Date   HDL 48.30 05/13/2022   HDL 50.30 08/08/2021   HDL 50.50 12/27/2020   Lab Results  Component Value Date   LDLCALC 75 05/13/2022   LDLCALC 81 08/08/2021   LDLCALC 77 12/27/2020   Lab Results  Component Value Date   TRIG 93.0 05/13/2022   TRIG 72.0 08/08/2021   TRIG 97.0 12/27/2020   Lab Results  Component Value Date   CHOLHDL 3 05/13/2022   CHOLHDL 3 08/08/2021   CHOLHDL 3 12/27/2020   Lab Results  Component Value Date   LDLDIRECT 178.1 04/20/2013   LDLDIRECT 173.6 05/14/2012   LDLDIRECT 119.7 02/27/2011      LABS:  Lab on 05/13/2022  Component Date Value Ref Range Status   Cholesterol 05/13/2022 142  0 - 200 mg/dL Final   ATP III Classification       Desirable:  < 200 mg/dL               Borderline High:  200 - 239 mg/dL          High:  > = 240 mg/dL   Triglycerides 05/13/2022 93.0  0.0 - 149.0 mg/dL Final   Normal:  <150 mg/dLBorderline High:  150 - 199 mg/dL   HDL 05/13/2022 48.30  >39.00 mg/dL Final   VLDL 05/13/2022 18.6  0.0 - 40.0 mg/dL Final   LDL  Cholesterol 05/13/2022 75  0 - 99 mg/dL Final   Total CHOL/HDL Ratio 05/13/2022 3   Final                  Men          Women1/2 Average Risk     3.4          3.3Average  Risk          5.0          4.42X Average Risk          9.6          7.13X Average Risk          15.0          11.0                       NonHDL 05/13/2022 93.70   Final   NOTE:  Non-HDL goal should be 30 mg/dL higher than patient's LDL goal (i.e. LDL goal of < 70 mg/dL, would have non-HDL goal of < 100 mg/dL)   Sodium 05/13/2022 140  135 - 145 mEq/L Final   Potassium 05/13/2022 4.8  3.5 - 5.1 mEq/L Final   Chloride 05/13/2022 105  96 - 112 mEq/L Final   CO2 05/13/2022 26  19 - 32 mEq/L Final   Glucose, Bld 05/13/2022 151 (H)  70 - 99 mg/dL Final   BUN 05/13/2022 15  6 - 23 mg/dL Final   Creatinine, Ser 05/13/2022 0.98  0.40 - 1.50 mg/dL Final   Total Bilirubin 05/13/2022 0.5  0.2 - 1.2 mg/dL Final   Alkaline Phosphatase 05/13/2022 64  39 - 117 U/L Final   AST 05/13/2022 25  0 - 37 U/L Final   ALT 05/13/2022 28  0 - 53 U/L Final   Total Protein 05/13/2022 7.8  6.0 - 8.3 g/dL Final   Albumin 05/13/2022 4.3  3.5 - 5.2 g/dL Final   GFR 05/13/2022 89.38  >60.00 mL/min Final   Calculated using the CKD-EPI Creatinine Equation (2021)   Calcium 05/13/2022 9.7  8.4 - 10.5 mg/dL Final   Hgb A1c MFr Bld 05/13/2022 6.5  4.6 - 6.5 % Final   Glycemic Control Guidelines for People with Diabetes:Non Diabetic:  <6%Goal of Therapy: <7%Additional Action Suggested:  >8%     Physical Examination:  BP 108/72   Pulse 80   Ht _0  (1.651 m)   Wt 184 lb (83.5 kg)   SpO2 99%   BMI 30.62 kg/m      ASSESSMENT:  Diabetes type 2 with mild obesity  See history of present illness for discussion of current diabetes management, blood sugar patterns and problems identified  His A1c is 6.5  With his changing his diet significantly and cutting back on portions of carbohydrates he has lost weight Also A1c is significantly better despite his  not taking Actos and Farxiga on his own  He is on a single drug regimen of Janumet once a day only, he prefers not to take medications at all Current BMI still above target  LIPIDS controlled with Crestor which he needs to continue long-term  Plan: Start monitoring blood sugar after lunch or dinner more consistently to help monitor his diet further Needs to increase frequency of exercise at least every other day  Discussed that long-term he will benefit more from using [place] instead of Januvia Will switch him his Janumet XR to Xigduo 02/1999 total dose daily Discussed that keeping him on higher dose of Farxiga will also enable some weight loss Instead of Janumet the new One Touch Verio monitor that was given today Discussed importance of checking after meals Start regular aerobic exercise and also some weight, need to do this consistently at least 4 to 5 days a week Discussed blood sugar targets after meals We will check lipids on the  next visit     There are no Patient Instructions on file for this visit.    Elayne Snare 05/15/2022, 9:25 AM

## 2022-05-15 NOTE — Patient Instructions (Signed)
Check blood sugars on waking up 2 days a week  Also check blood sugars about 2 hours after meals and do this after different meals by rotation  Recommended blood sugar levels on waking up are 90-130 and about 2 hours after meal is 130-160  Please bring your blood sugar monitor to each visit, thank you   

## 2022-05-23 ENCOUNTER — Telehealth: Payer: Self-pay

## 2022-05-23 ENCOUNTER — Other Ambulatory Visit: Payer: Self-pay | Admitting: Endocrinology

## 2022-05-23 DIAGNOSIS — E785 Hyperlipidemia, unspecified: Secondary | ICD-10-CM

## 2022-05-23 DIAGNOSIS — E1165 Type 2 diabetes mellitus with hyperglycemia: Secondary | ICD-10-CM

## 2022-05-23 NOTE — Telephone Encounter (Signed)
Patient needs PA for xigduo. Please start PA

## 2022-05-27 ENCOUNTER — Other Ambulatory Visit: Payer: Self-pay | Admitting: Endocrinology

## 2022-05-29 ENCOUNTER — Other Ambulatory Visit (HOSPITAL_COMMUNITY): Payer: Self-pay

## 2022-05-29 ENCOUNTER — Telehealth: Payer: Self-pay | Admitting: Pharmacy Technician

## 2022-05-29 NOTE — Telephone Encounter (Signed)
Pharmacy Patient Advocate Encounter   Received notification from CMA/Pt msgs that prior authorization for Xigduo XR 5-1000mg  is required/requested.   PA submitted on 05/29/22 to (ins) [employer] via CoverMyMeds [facility] PA Case ID: [id number] Status is pending

## 2022-05-29 NOTE — Telephone Encounter (Signed)
Patient called in to check status of PA. I don't see that one was started. Patient has been out of medication since last Friday.

## 2022-06-04 NOTE — Telephone Encounter (Signed)
Patient Advocate Encounter  Prior Authorization for Xigduo XR 5-1000MG  er tablets has been approved through [place]   Effective: 06-03-2022 to 05-11-2098

## 2022-06-05 NOTE — Telephone Encounter (Signed)
Mychart message has been sent to pharmacy.

## 2022-06-06 ENCOUNTER — Other Ambulatory Visit: Payer: Self-pay

## 2022-06-09 ENCOUNTER — Encounter: Payer: Self-pay | Admitting: Internal Medicine

## 2022-06-09 ENCOUNTER — Ambulatory Visit (INDEPENDENT_AMBULATORY_CARE_PROVIDER_SITE_OTHER): Payer: Managed Care, Other (non HMO) | Admitting: Internal Medicine

## 2022-06-09 VITALS — BP 126/66 | HR 73 | Temp 98.4°F | Resp 16 | Ht 65.0 in | Wt 172.5 lb

## 2022-06-09 DIAGNOSIS — Z0000 Encounter for general adult medical examination without abnormal findings: Secondary | ICD-10-CM | POA: Diagnosis not present

## 2022-06-09 DIAGNOSIS — Z1211 Encounter for screening for malignant neoplasm of colon: Secondary | ICD-10-CM | POA: Diagnosis not present

## 2022-06-09 DIAGNOSIS — R7989 Other specified abnormal findings of blood chemistry: Secondary | ICD-10-CM | POA: Diagnosis not present

## 2022-06-09 NOTE — Assessment & Plan Note (Signed)
-  Tdap 09-2016 -  pnm shot 2017 - prevnar at age 52 -COVID booster recommended - Declined flu shot  -- [facility]: Patient requested GI referral.  Will do --prostate ca screening: No symptoms, DRE negative, no FH.  Check PSA --Labs reviewed.  Will get: CBC TSH free T4 (TSH upper normal limits before), check PSA --Diet and exercise discussed

## 2022-06-09 NOTE — Progress Notes (Signed)
Subjective:    Patient ID: Shawn Walter, male    DOB: [birth year], 52 y.o.   MRN: [id number]  DOS:  06/09/2022 Type of visit - description: CPX  Here for CPX In general feeling well. From time to time he sees red blood per rectum with BMs, but denies diarrhea or black stools, no rectal pain or itching.  Also occasionally have bilateral low back pain, no radiation, pain is now mild.  He thinks it is because he sits at his chair for many hours aday.  Review of Systems  Other than above, a 14 point review of systems is negative     Past Medical History:  Diagnosis Date   Diabetes mellitus    Hyperlipidemia    Normal cardiac stress test 02-2011    Renal cyst, right    Complex, Dr. Louis Meckel   Urolithiasis 11-2013   L    Past Surgical History:  Procedure Laterality Date   INCISE AND DRAIN ABCESS  07/11/11   perirectal abscess    Social History   Socioeconomic History   Marital status: Married    Spouse name: Not on file   Number of children: 2   Years of education: Not on file   Highest education level: Not on file  Occupational History   Occupation: IT   Tobacco Use   Smoking status: Former    Types: Cigarettes    Quit date: 05/13/1999    Years since quitting: 23.0   Smokeless tobacco: Never  Vaping Use   Vaping Use: Never used  Substance and Sexual Activity   Alcohol use: Yes    Comment: occ   Drug use: No   Sexual activity: Not on file  Other Topics Concern   Not on file  Social History Narrative   Married, born in [place]     children x 2     daughter 2004 : Paisley, Chief Financial Officer    son 2010)    Social Determinants of Health   Financial Resource Strain: Not on file  Food Insecurity: Not on file  Transportation Needs: Not on file  Physical Activity: Not on file  Stress: Not on file  Social Connections: Not on file  Intimate Partner Violence: Not on file     Current Outpatient Medications  Medication Instructions   Dapagliflozin Pro-metFORMIN ER  (XIGDUO XR) 09-998 MG TB24 1 tablet, Oral, Daily   glucose blood (ONETOUCH VERIO) test strip Use to check blood sugar once a day   OneTouch Delica Lancets 20N MISC Use to check blood sugar once daily   rosuvastatin (CRESTOR) 20 MG tablet TAKE 1 TABLET BY MOUTH EVERY DAY       Objective:   Physical Exam BP 126/66   Pulse 73   Temp 98.4 F (36.9 C) (Oral)   Resp 16   Ht 5\' 5"  (1.651 m)   Wt 172 lb 8 oz (78.2 kg)   SpO2 97%   BMI 28.71 kg/m  General: Well developed, NAD, BMI noted Neck: No  thyromegaly  HEENT:  Normocephalic . Face symmetric, atraumatic Lungs:  CTA B Normal respiratory effort, no intercostal retractions, no accessory muscle use. Heart: RRR,  no murmur.  Abdomen:  Not distended, soft, non-tender. No rebound or rigidity.   Lower extremities: no pretibial edema bilaterally DRE: Normal sphincter tone, no stools, prostate normal. Skin: Exposed areas without rash. Not pale. Not jaundice Neurologic:  alert & oriented X3.  Speech normal, gait appropriate for age and unassisted Strength symmetric  and appropriate for age.  Psych: Cognition and judgment appear intact.  Cooperative with normal attention span and concentration.  Behavior appropriate. No anxious or depressed appearing.     Assessment    Assessment DM: Dr Dwyane Dee  Hyperlipidemia GU: --Urolithiasis  --L--  11-2013 --Multiple renal cysts per ultrasound 2016, MRI 10/2015:showing multiple bilateral Bosniak 1 and 2 renal cysts (no further eval per literature) --Pyelonephritis 14-Sep-2014 + FH CAD brother age 84 died on his asleep, unclear if he had an MI.  Stress test (-) 2012  PLAN  Here for CPX DM: Per Endo Hyperlipidemia: On rosuvastatin, last LDL satisfactory. Red blood per rectum: As described above, likely at distal  benign pathology.  He is being referred to GI for screening colonoscopy. Low back pain: Chronic, mild, recommend stretching and good posture.  If no better let me know. RTC 1  year

## 2022-06-09 NOTE — Patient Instructions (Addendum)
  Recommend stretching and good posture.  If you continue having back pain let me know  We are referring you for a colonoscopy.  You can reach gastroenterology at [phone]  Vaccines I recommend:  Shingrix (shingles) Covid booster    GO TO THE LAB : Get the blood work     [place], [place] back for   a physical exam in 1 year.    Per our records you are due for your diabetic eye exam. Please contact your eye doctor to schedule an appointment. Please have them send copies of your office visit notes to [place]. Our fax number is [phone]. If you need a referral to an eye doctor please let us know.

## 2022-06-09 NOTE — Assessment & Plan Note (Signed)
Here for CPX DM: Per Endo Hyperlipidemia: On rosuvastatin, last LDL satisfactory. Red blood per rectum: As described above, likely at distal  benign pathology.  He is being referred to GI for screening colonoscopy. Low back pain: Chronic, mild, recommend stretching and good posture.  If no better let me know. RTC 1 year

## 2022-06-10 LAB — CBC WITH DIFFERENTIAL/PLATELET
Basophils Absolute: 0.1 10*3/uL (ref 0.0–0.1)
Basophils Relative: 1.3 % (ref 0.0–3.0)
Eosinophils Absolute: 0.1 10*3/uL (ref 0.0–0.7)
Eosinophils Relative: 0.9 % (ref 0.0–5.0)
HCT: 41.1 % (ref 39.0–52.0)
Hemoglobin: 14.1 g/dL (ref 13.0–17.0)
Lymphocytes Relative: 24.1 % (ref 12.0–46.0)
Lymphs Abs: 1.6 10*3/uL (ref 0.7–4.0)
MCHC: 34.3 g/dL (ref 30.0–36.0)
MCV: 90 fl (ref 78.0–100.0)
Monocytes Absolute: 0.6 10*3/uL (ref 0.1–1.0)
Monocytes Relative: 9 % (ref 3.0–12.0)
Neutro Abs: 4.4 10*3/uL (ref 1.4–7.7)
Neutrophils Relative %: 64.7 % (ref 43.0–77.0)
Platelets: 278 10*3/uL (ref 150.0–400.0)
RBC: 4.57 Mil/uL (ref 4.22–5.81)
RDW: 12.7 % (ref 11.5–15.5)
WBC: 6.8 10*3/uL (ref 4.0–10.5)

## 2022-06-10 LAB — T4, FREE: Free T4: 0.93 ng/dL (ref 0.60–1.60)

## 2022-06-10 LAB — TSH: TSH: 2.46 u[IU]/mL (ref 0.35–5.50)

## 2022-06-10 LAB — PSA: PSA: 0.48 ng/mL (ref 0.10–4.00)

## 2022-07-10 ENCOUNTER — Other Ambulatory Visit: Payer: Self-pay

## 2022-07-10 DIAGNOSIS — E785 Hyperlipidemia, unspecified: Secondary | ICD-10-CM

## 2022-07-10 MED ORDER — DAPAGLIFLOZIN PRO-METFORMIN ER 5-1000 MG PO TB24: 1.0000 | ORAL_TABLET | Freq: Every day | ORAL | 1 refills | Status: DC

## 2022-07-10 MED ORDER — ROSUVASTATIN CALCIUM 20 MG PO TABS: 20.0000 mg | ORAL_TABLET | Freq: Every day | ORAL | 1 refills | Status: DC

## 2022-07-16 ENCOUNTER — Encounter: Payer: Self-pay | Admitting: Gastroenterology

## 2022-07-16 ENCOUNTER — Ambulatory Visit (AMBULATORY_SURGERY_CENTER): Payer: Managed Care, Other (non HMO)

## 2022-07-16 VITALS — Ht 65.0 in | Wt 165.0 lb

## 2022-07-16 DIAGNOSIS — Z1211 Encounter for screening for malignant neoplasm of colon: Secondary | ICD-10-CM

## 2022-07-16 DIAGNOSIS — R1013 Epigastric pain: Secondary | ICD-10-CM

## 2022-07-16 DIAGNOSIS — R195 Other fecal abnormalities: Secondary | ICD-10-CM

## 2022-07-16 MED ORDER — NA SULFATE-K SULFATE-MG SULF 17.5-3.13-1.6 GM/177ML PO SOLN: 1.0000 | Freq: Once | ORAL | 0 refills | Status: AC

## 2022-07-16 NOTE — Progress Notes (Signed)
Pre visit completed via phone call; Patient verified name, DOB, and address;  No egg or soy allergy known to patient;  No issues known to pt with past sedation with any surgeries or procedures; Patient denies ever being told they had issues or difficulty with intubation;  No FH of Malignant Hyperthermia; Pt is not on diet pills; Pt is not on home 02;  Pt is not on blood thinners;  Pt denies issues with constipation  No A fib or A flutter; Have any cardiac testing pending--NO Pt instructed to use [email or web address] or GoodRx for a price reduction on prep;   Insurance verified during [place] appt=[employer]  Patient's chart reviewed by Osvaldo Angst CNRA prior to previsit and patient appropriate for the [place].  Previsit completed and red dot placed by patient's name on their procedure day (on provider's schedule).

## 2022-07-22 ENCOUNTER — Ambulatory Visit (INDEPENDENT_AMBULATORY_CARE_PROVIDER_SITE_OTHER): Payer: Managed Care, Other (non HMO) | Admitting: Internal Medicine

## 2022-07-22 ENCOUNTER — Telehealth: Payer: Self-pay | Admitting: Internal Medicine

## 2022-07-22 ENCOUNTER — Encounter: Payer: Self-pay | Admitting: Internal Medicine

## 2022-07-22 VITALS — BP 124/72 | HR 71 | Temp 98.2°F | Resp 16 | Ht 65.0 in | Wt 168.1 lb

## 2022-07-22 DIAGNOSIS — R1013 Epigastric pain: Secondary | ICD-10-CM

## 2022-07-22 DIAGNOSIS — R519 Headache, unspecified: Secondary | ICD-10-CM | POA: Diagnosis not present

## 2022-07-22 LAB — CBC WITH DIFFERENTIAL/PLATELET
Basophils Absolute: 0.1 10*3/uL (ref 0.0–0.1)
Basophils Relative: 1.4 % (ref 0.0–3.0)
Eosinophils Absolute: 0.1 10*3/uL (ref 0.0–0.7)
Eosinophils Relative: 1.9 % (ref 0.0–5.0)
HCT: 44.8 % (ref 39.0–52.0)
Hemoglobin: 15.1 g/dL (ref 13.0–17.0)
Lymphocytes Relative: 24.9 % (ref 12.0–46.0)
Lymphs Abs: 1.6 10*3/uL (ref 0.7–4.0)
MCHC: 33.6 g/dL (ref 30.0–36.0)
MCV: 90 fl (ref 78.0–100.0)
Monocytes Absolute: 0.6 10*3/uL (ref 0.1–1.0)
Monocytes Relative: 8.7 % (ref 3.0–12.0)
Neutro Abs: 4.1 10*3/uL (ref 1.4–7.7)
Neutrophils Relative %: 63.1 % (ref 43.0–77.0)
Platelets: 292 10*3/uL (ref 150.0–400.0)
RBC: 4.98 Mil/uL (ref 4.22–5.81)
RDW: 13.1 % (ref 11.5–15.5)
WBC: 6.5 10*3/uL (ref 4.0–10.5)

## 2022-07-22 LAB — HEMOCCULT GUIAC POC 1CARD (OFFICE): Fecal Occult Blood, POC: POSITIVE — AB

## 2022-07-22 LAB — COMPREHENSIVE METABOLIC PANEL
ALT: 26 U/L (ref 0–53)
AST: 24 U/L (ref 0–37)
Albumin: 4.1 g/dL (ref 3.5–5.2)
Alkaline Phosphatase: 83 U/L (ref 39–117)
BUN: 14 mg/dL (ref 6–23)
CO2: 24 mEq/L (ref 19–32)
Calcium: 9.6 mg/dL (ref 8.4–10.5)
Chloride: 102 mEq/L (ref 96–112)
Creatinine, Ser: 0.81 mg/dL (ref 0.40–1.50)
GFR: 102.06 mL/min (ref >60.00)
Glucose, Bld: 143 mg/dL — ABNORMAL HIGH (ref 70–99)
Potassium: 4.4 mEq/L (ref 3.5–5.1)
Sodium: 138 mEq/L (ref 135–145)
Total Bilirubin: 0.7 mg/dL (ref 0.2–1.2)
Total Protein: 7.5 g/dL (ref 6.0–8.3)

## 2022-07-22 MED ORDER — PANTOPRAZOLE SODIUM 40 MG PO TBEC: 40.0000 mg | DELAYED_RELEASE_TABLET | Freq: Two times a day (BID) | ORAL | 3 refills | Status: DC

## 2022-07-22 NOTE — Addendum Note (Signed)
Addended by: Curlene Labrum E on: 07/22/2022 11:20 AM   Modules accepted: Orders

## 2022-07-22 NOTE — Patient Instructions (Addendum)
Change omeprazole to pantoprazole 40 mg twice daily  Change sucralfate to as needed only  I am reaching out to gastroenterology to see if you need to endoscopy  Call anytime or go to the ER if severe abdominal pain, change in the color of the stools, severe nausea vomiting.  GO TO THE LAB : Get the blood work     Per our records you are due for your diabetic eye exam. Please contact your eye doctor to schedule an appointment. Please have them send copies of your office visit notes to [place]. Our fax number is [phone]. If you need a referral to an eye doctor please let us know.

## 2022-07-22 NOTE — Progress Notes (Unsigned)
Subjective:    Patient ID: Shawn Walter, male    DOB: [birth year], 52 y.o.   MRN: [id number]  DOS:  07/22/2022 Type of visit - description: Acute  On March 1 he developed a URI, symptoms resolved quickly On March 7 GI symptoms started: Burning epigastric pain on and off, not necessarily worse or better after eating.  No other people affected with GI symptoms.  Pain does not radiate to the back. He admits to a new onset of heartburn. Prior to onset of symptoms he got ibuprofen 600 mg x1 time.  He also had some subjective fever and some headache. Appetite has been normal Had some nausea and vomited x 1 but no hematemesis. BMs are normal, stools normal in color. Denies dysphagia, odynophagia. No LUTS. No RUQ abdominal pain  With above symptoms went to urgent care, prescribed omeprazole and sucralfate. GI symptoms perhaps slightly better, he is still tired.   Review of Systems See above   Past Medical History:  Diagnosis Date   Diabetes mellitus    on meds   Hyperlipidemia    on meds   Normal cardiac stress test 02/10/2011   Renal cyst, right    Complex, Dr. Louis Meckel   Urolithiasis 11/09/2013   L    Past Surgical History:  Procedure Laterality Date   INCISE AND DRAIN ABCESS  07/11/11   perirectal abscess     Current Outpatient Medications  Medication Instructions   Dapagliflozin Pro-metFORMIN ER (XIGDUO XR) 09-998 MG TB24 1 tablet, Oral, Daily   glucose blood (ONETOUCH VERIO) test strip Use to check blood sugar once a day   omeprazole (PRILOSEC) 20 mg, Oral, Daily   OneTouch Delica Lancets 99991111 MISC Use to check blood sugar once daily   rosuvastatin (CRESTOR) 20 mg, Oral, Daily   sucralfate (CARAFATE) 1 g, Oral, 3 times daily       Objective:   Physical Exam BP 124/72   Pulse 71   Temp 98.2 F (36.8 C) (Oral)   Resp 16   Ht '5\' 5"'$  (1.651 m)   Wt 168 lb 2 oz (76.3 kg)   SpO2 97%   BMI 27.98 kg/m  General:   Well developed, NAD, BMI noted.  HEENT:   Normocephalic . Face symmetric, atraumatic Lungs:  CTA B Normal respiratory effort, no intercostal retractions, no accessory muscle use. Heart: RRR,  no murmur.  Abdomen:  Not distended, soft, mild tenderness without mass or rebound at the epigastric area DRE: Normal sphincter tone, very small amount of yellowish stools found, Hemoccult positive. Skin: Not pale. Not jaundice Lower extremities: no pretibial edema bilaterally  Neurologic:  alert & oriented X3.  Speech normal, gait appropriate for age and unassisted Psych--  Cognition and judgment appear intact.  Cooperative with normal attention span and concentration.  Behavior appropriate. No anxious or depressed appearing.     Assessment     Assessment DM: Dr Dwyane Dee  Hyperlipidemia GU: --Urolithiasis  --L--  11-2013 --Multiple renal cysts per ultrasound 2016, MRI 10/2015:showing multiple bilateral Bosniak 1 and 2 renal cysts (no further eval per literature) --Pyelonephritis 2014-10-06 + FH CAD brother age 13 died on his asleep, unclear if he had an MI.  Stress test (-) 2012  PLAN  Epigastric pain: Developed epigastric pain described as burning along with heartburn as described above. Went to urgent care, no blood work done, they Rx omeprazole sucralfate. Symptoms are perhaps slightly better. On physical exam he is still slightly tender at the epigastric area stools  are brown but Hemoccult positive. Plan: Change omeprazole 20 mg daily to pantoprazole 40 mg twice daily Change sucralfate to as needed CBC, CMP Has a colonoscopy scheduled for 08/06/2022, will reach out to GI regards symptoms.  May need a EGD. Seek medical attention if symptoms severe. Headache: Also having a mild headache since he had a URI March 1, not the worst of his life.  Recommend Tylenol for now, is probably lingering from the URI.  If not better let me know. Recommend to come back next month if not asymptomatic.

## 2022-07-22 NOTE — Telephone Encounter (Signed)
PA initiated via Covermymeds; KEY: [id number]. Awaiting determination.

## 2022-07-22 NOTE — Telephone Encounter (Signed)
PA approved.   [id number];Review Type:Qty;Coverage Start Date:07/22/2022;Coverage End Date:07/22/2023;

## 2022-07-23 ENCOUNTER — Encounter: Payer: Self-pay | Admitting: Gastroenterology

## 2022-07-23 NOTE — Assessment & Plan Note (Signed)
Epigastric pain: Developed epigastric pain described as burning along with heartburn as described above. Went to urgent care, no blood work done, they Rx omeprazole sucralfate. Symptoms are perhaps slightly better. On physical exam he is still slightly tender at the epigastric area, stools are brown but Hemoccult positive. Plan: Change omeprazole 20 mg daily to pantoprazole 40 mg twice daily Change sucralfate to as needed CBC, CMP Has a colonoscopy scheduled for 08/06/2022, will reach out to GI regards symptoms.  May need a EGD. Seek medical attention if symptoms severe. Headache: Also having a mild headache since he had a URI March 1, not the worst of his life.  Recommend Tylenol for now, is probably lingering from the URI.  If not better let me know. Recommend to come back next month if  HA persists

## 2022-08-03 ENCOUNTER — Encounter: Payer: Self-pay | Admitting: Certified Registered Nurse Anesthetist

## 2022-08-06 ENCOUNTER — Ambulatory Visit: Payer: Managed Care, Other (non HMO) | Admitting: Gastroenterology

## 2022-08-06 ENCOUNTER — Encounter: Payer: Self-pay | Admitting: Gastroenterology

## 2022-08-06 VITALS — BP 132/88 | HR 64 | Temp 97.5°F | Resp 15 | Ht 65.0 in | Wt 165.0 lb

## 2022-08-06 DIAGNOSIS — K2289 Other specified disease of esophagus: Secondary | ICD-10-CM

## 2022-08-06 DIAGNOSIS — K259 Gastric ulcer, unspecified as acute or chronic, without hemorrhage or perforation: Secondary | ICD-10-CM

## 2022-08-06 DIAGNOSIS — R1013 Epigastric pain: Secondary | ICD-10-CM

## 2022-08-06 DIAGNOSIS — Z1211 Encounter for screening for malignant neoplasm of colon: Secondary | ICD-10-CM

## 2022-08-06 DIAGNOSIS — K319 Disease of stomach and duodenum, unspecified: Secondary | ICD-10-CM

## 2022-08-06 DIAGNOSIS — K621 Rectal polyp: Secondary | ICD-10-CM

## 2022-08-06 DIAGNOSIS — R195 Other fecal abnormalities: Secondary | ICD-10-CM | POA: Diagnosis not present

## 2022-08-06 DIAGNOSIS — D129 Benign neoplasm of anus and anal canal: Secondary | ICD-10-CM

## 2022-08-06 DIAGNOSIS — K635 Polyp of colon: Secondary | ICD-10-CM

## 2022-08-06 MED ORDER — HYDROCORTISONE ACETATE 25 MG RE SUPP: 25.0000 mg | RECTAL | 4 refills | Status: AC

## 2022-08-06 MED ORDER — SODIUM CHLORIDE 0.9 % IV SOLN: 500.0000 mL | Freq: Once | INTRAVENOUS | Status: DC

## 2022-08-06 MED ORDER — SUCRALFATE 1 G PO TABS: 1.0000 g | ORAL_TABLET | Freq: Four times a day (QID) | ORAL | 1 refills | Status: DC

## 2022-08-06 NOTE — Progress Notes (Signed)
0923 Robinul 0.1 mg IV given due large amount of secretions upon assessment.  MD made aware, vss  

## 2022-08-06 NOTE — Op Note (Signed)
[name] Procedure Date: 08/06/2022 9:10 AM MRN: [id number] Endoscopist: Jackquline Denmark , MD, [id number] Age: 52 Referring MD:  Date of Birth: 11-Jun-1970 Gender: Male Account #: [id number] Procedure:                Colonoscopy Indications:              Screening for colorectal malignant neoplasm. Heme                            positive stools. Medicines:                Monitored Anesthesia Care Procedure:                Pre-Anesthesia Assessment:                           - Prior to the procedure, a History and Physical                            was performed, and patient medications and                            allergies were reviewed. The patient's tolerance of                            previous anesthesia was also reviewed. The risks                            and benefits of the procedure and the sedation                            options and risks were discussed with the patient.                            All questions were answered, and informed consent                            was obtained. Prior Anticoagulants: The patient has                            taken no anticoagulant or antiplatelet agents. ASA                            Grade Assessment: II - A patient with mild systemic                            disease. After reviewing the risks and benefits,                            the patient was deemed in satisfactory condition to                            undergo the procedure.  After obtaining informed consent, the colonoscope                            was passed under direct vision. Throughout the                            procedure, the patient's blood pressure, pulse, and                            oxygen saturations were monitored continuously. The                            [id number]-[id number] Colonoscope [id number] was introduced                            through the anus and advanced to the 2 cm into  the                            ileum. The colonoscopy was performed without                            difficulty. The patient tolerated the procedure                            well. The quality of the bowel preparation was                            good. The terminal ileum, ileocecal valve,                            appendiceal orifice, and rectum were photographed. Scope In: 9:43:46 AM Scope Out: 10:01:28 AM Scope Withdrawal Time: 0 hours 14 minutes 53 seconds  Total Procedure Duration: 0 hours 17 minutes 42 seconds  Findings:                 A 4 mm polyp was found in the distal rectum just                            above the dentate line, best visualized on the                            retroflexed exam. The polyp was sessile. The polyp                            was removed with a cold biopsy forceps. Resection                            and retrieval were complete.                           Internal hemorrhoids were found during retroflexion                            and during perianal exam.  The hemorrhoids were                            boggy, moderate and Grade I (internal hemorrhoids                            that do not prolapse). There were red wale markings                            suggestive of recent bleeding. No active bleeding.                           The terminal ileum appeared normal.                           The exam was otherwise without abnormality on                            direct and retroflexion views. Complications:            No immediate complications. Estimated Blood Loss:     Estimated blood loss: none. Impression:               - One 4 mm polyp in the distal rectum, removed with                            a cold biopsy forceps. Resected and retrieved.                           - Internal hemorrhoids (likely etiology of heme                            positive stools)                           - The examined portion of the ileum was normal.                            - The examination was otherwise normal on direct                            and retroflexion views. Recommendation:           - Patient has a contact number available for                            emergencies. The signs and symptoms of potential                            delayed complications were discussed with the                            patient. Return to normal activities tomorrow.                            Written discharge instructions were  provided to the                            patient.                           - Resume previous high-fiber diet. If hard stools,                            start fiber supplements                           - Continue present medications.                           - Await pathology results.                           - Repeat colonoscopy for surveillance based on                            pathology results.                           - Use hydrocortisone (anusol HC) suppository 25 mg                            1 per rectum once a day daily at bedtime x 10 days.                           - If any anorectal problems, would have low                            threshold in referring him for surgical opinion for                            EUA/hemorrhoidectomy                           - The findings and recommendations were discussed                            with the patient's family. Jackquline Denmark, MD 08/06/2022 10:09:51 AM This report has been signed electronically.

## 2022-08-06 NOTE — Op Note (Signed)
[facility] Patient Name: Shawn Walter Procedure Date: 08/06/2022 9:24 AM MRN: [id number] Endoscopist: Jackquline Denmark , MD, [id number] Age: 52 Referring MD:  Date of Birth: 23-Apr-1971 Gender: Male Account #: [id number] Procedure:                Upper GI endoscopy Indications:              Epigastric abdominal pain Medicines:                Monitored Anesthesia Care Procedure:                Pre-Anesthesia Assessment:                           - Prior to the procedure, a History and Physical                            was performed, and patient medications and                            allergies were reviewed. The patient's tolerance of                            previous anesthesia was also reviewed. The risks                            and benefits of the procedure and the sedation                            options and risks were discussed with the patient.                            All questions were answered, and informed consent                            was obtained. Prior Anticoagulants: The patient has                            taken no anticoagulant or antiplatelet agents. ASA                            Grade Assessment: II - A patient with mild systemic                            disease. After reviewing the risks and benefits,                            the patient was deemed in satisfactory condition to                            undergo the procedure.                           After obtaining informed consent, the endoscope was  passed under direct vision. Throughout the                            procedure, the patient's blood pressure, pulse, and                            oxygen saturations were monitored continuously. The                            Olympus Scope [id number] was introduced through the                            mouth, and advanced to the second part of duodenum.                            The upper GI endoscopy was  accomplished without                            difficulty. The patient tolerated the procedure                            well. Scope In: Scope Out: Findings:                 The examined esophagus was normal with well-defined                            Z-line at 40 cm, examined by NBI. Biopsies were                            obtained from the proximal and distal esophagus                            with cold forceps for histology to r/o eosinophilic                            esophagitis.                           One non-bleeding cratered gastric ulcer with no                            stigmata of bleeding was found in the gastric                            antrum. The lesion was 10 mm in largest dimension.                            Biopsies were taken with a cold forceps for                            histology from the edges and base.                           The examined  duodenum was normal. Biopsies for                            histology were taken with a cold forceps for                            evaluation of celiac disease. Complications:            No immediate complications. Estimated Blood Loss:     Estimated blood loss: none. Impression:               - Gastric ulcer -biopsied.                           - Mild surrounding gastritis. Recommendation:           - Patient has a contact number available for                            emergencies. The signs and symptoms of potential                            delayed complications were discussed with the                            patient. Return to normal activities tomorrow.                            Written discharge instructions were provided to the                            patient.                           - Resume previous diet.                           - Protonix 40 mg p.o. twice daily x 8 weeks, then                            once a day.                           - Carafate 1 g p.o. 4 times daily x 2  weeks                           - No aspirin, ibuprofen, naproxen, or other                            non-steroidal anti-inflammatory drugs.                           - Await pathology results.                           - Repeat EGD in 12 weeks to ensure healing.                           -  The findings and recommendations were discussed                            with the patient's family. Jackquline Denmark, MD 08/06/2022 9:41:49 AM This report has been signed electronically.

## 2022-08-06 NOTE — Progress Notes (Signed)
[facility] History and Physical   Primary Care Physician:  Colon Branch, MD   Reason for Procedure:   GERD/epi pain/CRC screening  Plan:    EGD/colon today     HPI: Shawn Walter is a 52 y.o. male    Past Medical History:  Diagnosis Date   Diabetes mellitus    on meds   GERD (gastroesophageal reflux disease)    Hyperlipidemia    on meds   Normal cardiac stress test 02/10/2011   Renal cyst, right    Complex, Dr. Louis Meckel   Urolithiasis 11/09/2013   L    Past Surgical History:  Procedure Laterality Date   INCISE AND DRAIN ABCESS  07/11/11   perirectal abscess     Prior to Admission medications   Medication Sig Start Date End Date Taking? Authorizing Provider  Dapagliflozin Pro-metFORMIN ER (XIGDUO XR) 09-998 MG TB24 Take 1 tablet by mouth daily. 07/10/22  Yes Elayne Snare, MD  glucose blood (ONETOUCH VERIO) test strip Use to check blood sugar once a day 12/13/21  Yes Elayne Snare, MD  OneTouch Delica Lancets 99991111 MISC Use to check blood sugar once daily 12/13/21  Yes Elayne Snare, MD  pantoprazole (PROTONIX) 40 MG tablet Take 1 tablet (40 mg total) by mouth 2 (two) times daily. 07/22/22  Yes Paz, Alda Berthold, MD  rosuvastatin (CRESTOR) 20 MG tablet Take 1 tablet (20 mg total) by mouth daily. 07/10/22  Yes Elayne Snare, MD  sucralfate (CARAFATE) 1 g tablet Take 1 g by mouth 3 (three) times daily. 07/19/22   [provider]    Current Outpatient Medications  Medication Sig Dispense Refill   Dapagliflozin Pro-metFORMIN ER (XIGDUO XR) 09-998 MG TB24 Take 1 tablet by mouth daily. 90 tablet 1   glucose blood (ONETOUCH VERIO) test strip Use to check blood sugar once a day 100 each 3   OneTouch Delica Lancets 99991111 MISC Use to check blood sugar once daily 100 each 3   pantoprazole (PROTONIX) 40 MG tablet Take 1 tablet (40 mg total) by mouth 2 (two) times daily. 60 tablet 3   rosuvastatin (CRESTOR) 20 MG tablet Take 1 tablet (20 mg total) by mouth daily. 90 tablet 1    sucralfate (CARAFATE) 1 g tablet Take 1 g by mouth 3 (three) times daily.     Current Facility-Administered Medications  Medication Dose Route Frequency Provider Last Rate Last Admin   0.9 %  sodium chloride infusion  500 mL Intravenous Once Jackquline Denmark, MD        Allergies as of 08/06/2022   (No Known Allergies)    Family History  Problem Relation Age of Onset   Breast cancer Mother 38   Sudden death Brother 47       MI ?  Passed away on his sleep   Hypertension Neg Hx    Hyperlipidemia Neg Hx    Heart attack Neg Hx    Diabetes Neg Hx    Colon cancer Neg Hx    Prostate cancer Neg Hx    Colon polyps Neg Hx    Rectal cancer Neg Hx    Stomach cancer Neg Hx     Social History   Socioeconomic History   Marital status: Married    Spouse name: Not on file   Number of children: 2   Years of education: Not on file   Highest education level: Not on file  Occupational History   Occupation: IT   Tobacco Use   Smoking  status: Former    Types: Cigarettes    Quit date: 05/13/1999    Years since quitting: 23.2   Smokeless tobacco: Never  Vaping Use   Vaping Use: Never used  Substance and Sexual Activity   Alcohol use: Yes    Comment: 0-3 per month   Drug use: No   Sexual activity: Not on file  Other Topics Concern   Not on file  Social History Narrative   Married, born in [place]     children x 2     daughter 2004 : [place], Chief Financial Officer    son 2010)    Social Determinants of Health   Financial Resource Strain: Not on file  Food Insecurity: Not on file  Transportation Needs: Not on file  Physical Activity: Not on file  Stress: Not on file  Social Connections: Not on file  Intimate Partner Violence: Not on file    Review of Systems: Positive for none All other review of systems negative except as mentioned in the HPI.  Physical Exam: Vital signs in last 24 hours: @VSRANGES @   General:   Alert,  Well-developed, well-nourished, pleasant and cooperative in  NAD Lungs:  Clear throughout to auscultation.   Heart:  Regular rate and rhythm; no murmurs, clicks, rubs,  or gallops. Abdomen:  Soft, nontender and nondistended. Normal bowel sounds.   Neuro/Psych:  Alert and cooperative. Normal mood and affect. A and O x 3    No significant changes were identified.  The patient continues to be an appropriate candidate for the planned procedure and anesthesia.   Carmell Austria, MD. [facility] 08/06/2022 9:14 AM@

## 2022-08-06 NOTE — Progress Notes (Signed)
Called to room to assist during endoscopic procedure.  Patient ID and intended procedure confirmed with present staff. Received instructions for my participation in the procedure from the performing physician.  

## 2022-08-06 NOTE — Progress Notes (Signed)
Report given to PACU, vss 

## 2022-08-06 NOTE — Progress Notes (Signed)
Pt's states no medical or surgical changes since previsit or office visit. 

## 2022-08-06 NOTE — Patient Instructions (Signed)
Sent prescriptions to [facility] in [place] for suppository and carafate.  Handouts given on hemorrhoids and high fiber diet.  YOU HAD AN ENDOSCOPIC PROCEDURE TODAY AT [facility]:   Refer to the procedure report that was given to you for any specific questions about what was found during the examination.  If the procedure report does not answer your questions, please call your gastroenterologist to clarify.  If you requested that your care partner not be given the details of your procedure findings, then the procedure report has been included in a sealed envelope for you to review at your convenience later.  YOU SHOULD EXPECT: Some feelings of bloating in the abdomen. Passage of more gas than usual.  Walking can help get rid of the air that was put into your GI tract during the procedure and reduce the bloating. If you had a lower endoscopy (such as a colonoscopy or flexible sigmoidoscopy) you may notice spotting of blood in your stool or on the toilet paper. If you underwent a bowel prep for your procedure, you may not have a normal bowel movement for a few days.  Please Note:  You might notice some irritation and congestion in your nose or some drainage.  This is from the oxygen used during your procedure.  There is no need for concern and it should clear up in a day or so.  SYMPTOMS TO REPORT IMMEDIATELY:  Following lower endoscopy (colonoscopy or flexible sigmoidoscopy):  Excessive amounts of blood in the stool  Significant tenderness or worsening of abdominal pains  Swelling of the abdomen that is new, acute  Fever of 100F or higher  Following upper endoscopy (EGD)  Vomiting of blood or coffee ground material  New chest pain or pain under the shoulder blades  Painful or persistently difficult swallowing  New shortness of breath  Fever of 100F or higher  Black, tarry-looking stools  For urgent or emergent issues, a gastroenterologist can be reached at any hour by calling  [phone]. Do not use MyChart messaging for urgent concerns.    DIET:  We do recommend a small meal at first, but then you may proceed to your regular diet.  Drink plenty of fluids but you should avoid alcoholic beverages for 24 hours.  ACTIVITY:  You should plan to take it easy for the rest of today and you should NOT DRIVE or use heavy machinery until tomorrow (because of the sedation medicines used during the test).    FOLLOW UP: Our staff will call the number listed on your records the next business day following your procedure.  We will call around 7:15- 8:00 am to check on you and address any questions or concerns that you may have regarding the information given to you following your procedure. If we do not reach you, we will leave a message.     If any biopsies were taken you will be contacted by phone or by letter within the next 1-3 weeks.  Please call us at [phone] if you have not heard about the biopsies in 3 weeks.    SIGNATURES/CONFIDENTIALITY: You and/or your care partner have signed paperwork which will be entered into your electronic medical record.  These signatures attest to the fact that that the information above on your After Visit Summary has been reviewed and is understood.  Full responsibility of the confidentiality of this discharge information lies with you and/or your care-partner.

## 2022-08-07 ENCOUNTER — Telehealth: Payer: Self-pay

## 2022-08-07 NOTE — Telephone Encounter (Signed)
  Follow up Call-     08/06/2022    8:46 AM  Call back number  Post procedure Call Back phone  # [phone]  Permission to leave phone message Yes     Patient questions:  Do you have a fever, pain , or abdominal swelling? No. Pain Score  0  Have you tolerated food without any problems? Yes.    Have you been able to return to your normal activities? Yes.    Do you have any questions about your discharge instructions: Diet   No. Medications  No. Follow up visit  No.  Do you have questions or concerns about your Care? No.  Actions: * If pain score is 4 or above: No action needed, pain <4.

## 2022-08-10 ENCOUNTER — Encounter: Payer: Self-pay | Admitting: Gastroenterology

## 2022-08-14 ENCOUNTER — Telehealth: Payer: Self-pay

## 2022-08-14 DIAGNOSIS — K259 Gastric ulcer, unspecified as acute or chronic, without hemorrhage or perforation: Secondary | ICD-10-CM

## 2022-08-14 DIAGNOSIS — R1013 Epigastric pain: Secondary | ICD-10-CM

## 2022-08-14 NOTE — Telephone Encounter (Signed)
Spoke with patient and scheduled EGD for 11-07-2022 at 9am for ensure gastric healing. Instructions sent and mailed and will call patient in May to go over this. Amb ref sent

## 2022-08-16 ENCOUNTER — Other Ambulatory Visit: Payer: Self-pay | Admitting: Endocrinology

## 2022-08-21 NOTE — Telephone Encounter (Signed)
Previsit 5-20 due to new policy and pt voiced understanding

## 2022-08-21 NOTE — Telephone Encounter (Addendum)
And done telephone for patient on 5-20

## 2022-08-25 ENCOUNTER — Encounter: Payer: Self-pay | Admitting: *Deleted

## 2022-09-04 ENCOUNTER — Other Ambulatory Visit: Payer: Self-pay | Admitting: Endocrinology

## 2022-09-04 DIAGNOSIS — E785 Hyperlipidemia, unspecified: Secondary | ICD-10-CM

## 2022-09-23 ENCOUNTER — Other Ambulatory Visit: Payer: Managed Care, Other (non HMO)

## 2022-09-26 ENCOUNTER — Ambulatory Visit: Payer: Managed Care, Other (non HMO) | Admitting: Endocrinology

## 2022-09-29 ENCOUNTER — Ambulatory Visit (AMBULATORY_SURGERY_CENTER): Payer: Managed Care, Other (non HMO) | Admitting: *Deleted

## 2022-09-29 VITALS — Ht 65.0 in | Wt 175.0 lb

## 2022-09-29 DIAGNOSIS — K259 Gastric ulcer, unspecified as acute or chronic, without hemorrhage or perforation: Secondary | ICD-10-CM

## 2022-09-29 DIAGNOSIS — K2990 Gastroduodenitis, unspecified, without bleeding: Secondary | ICD-10-CM

## 2022-09-29 DIAGNOSIS — K2970 Gastritis, unspecified, without bleeding: Secondary | ICD-10-CM

## 2022-09-29 NOTE — Progress Notes (Signed)
Pt's name and DOB verified at the beginning of the pre-visit.  Pt denies any difficulty with ambulating,sitting, laying down or rolling side to side Gave both [facility] main # and MD on call # prior to instructions.  No egg or soy allergy known to patient  No issues known to pt with past sedation with any surgeries or procedures Patient denies ever being intubated Pt has no issues moving head neck or swallowing No FH of Malignant Hyperthermia Pt is not on diet pills Pt is not on home 02  Pt is not on blood thinners  Pt denies issues with constipation  Pt is not on dialysis Pt denise any abnormal heart rhythms  Pt denies any upcoming cardiac testing Pt encouraged to use to use Singlecare or Goodrx to reduce cost  Patient's chart reviewed by Shawn Walter CNRA prior to pre-visit and patient appropriate for the [facility].  Pre-visit completed and red dot placed by patient's name on their procedure day (on provider's schedule).  . Visit by phone Pt states weight is  Instructed pt why it is important to and  to call if they have any changes in health or new medications. Directed them to the # given and on instructions.   Pt states they will.  Instructions reviewed with pt and pt states understanding. Instructed to review again prior to procedure. Pt states they will.  Instructions sent by mail with coupon and by my chart  3

## 2022-10-11 ENCOUNTER — Other Ambulatory Visit: Payer: Self-pay | Admitting: Gastroenterology

## 2022-10-23 ENCOUNTER — Encounter: Payer: Self-pay | Admitting: Gastroenterology

## 2022-11-07 ENCOUNTER — Ambulatory Visit: Payer: Managed Care, Other (non HMO) | Admitting: Gastroenterology

## 2022-11-07 ENCOUNTER — Encounter: Payer: Self-pay | Admitting: Gastroenterology

## 2022-11-07 VITALS — BP 113/72 | HR 77 | Temp 97.7°F | Resp 22 | Ht 65.0 in | Wt 175.0 lb

## 2022-11-07 DIAGNOSIS — K2970 Gastritis, unspecified, without bleeding: Secondary | ICD-10-CM

## 2022-11-07 DIAGNOSIS — K259 Gastric ulcer, unspecified as acute or chronic, without hemorrhage or perforation: Secondary | ICD-10-CM

## 2022-11-07 MED ORDER — SODIUM CHLORIDE 0.9 % IV SOLN: 500.0000 mL | Freq: Once | INTRAVENOUS | Status: DC

## 2022-11-07 NOTE — Progress Notes (Signed)
Pt's states no medical or surgical changes since previsit or office visit. 

## 2022-11-07 NOTE — Op Note (Signed)
[facility] Patient Name: Shawn Walter Procedure Date: 11/07/2022 9:19 AM MRN: [id number] Endoscopist: Lynann Bologna , MD, [id number] Age: 52 Referring MD:  Date of Birth: Dec 20, 1970 Gender: Male Account #: [id number] Procedure:                Upper GI endoscopy Indications:              FU GU 08/06/2022 Medicines:                Monitored Anesthesia Care Procedure:                Pre-Anesthesia Assessment:                           - Prior to the procedure, a History and Physical                            was performed, and patient medications and                            allergies were reviewed. The patient's tolerance of                            previous anesthesia was also reviewed. The risks                            and benefits of the procedure and the sedation                            options and risks were discussed with the patient.                            All questions were answered, and informed consent                            was obtained. Prior Anticoagulants: The patient has                            taken no anticoagulant or antiplatelet agents. ASA                            Grade Assessment: II - A patient with mild systemic                            disease. After reviewing the risks and benefits,                            the patient was deemed in satisfactory condition to                            undergo the procedure.                           After obtaining informed consent, the endoscope was  passed under direct vision. Throughout the                            procedure, the patient's blood pressure, pulse, and                            oxygen saturations were monitored continuously. The                            GIF [id number] #[id number] was introduced through the                            mouth, and advanced to the second part of duodenum.                            The upper GI endoscopy was accomplished  without                            difficulty. The patient tolerated the procedure                            well. Scope In: Scope Out: Findings:                 The examined esophagus was normal with well-defined                            Z-line examined by NBI.                           The entire examined stomach was normal. The ulcer                            had completely healed.                           The examined duodenum was normal. Complications:            No immediate complications. Estimated Blood Loss:     Estimated blood loss: none. Impression:               - Normal EGD. Complete healing of gastric ulcer.                           - No specimens collected. Recommendation:           - Patient has a contact number available for                            emergencies. The signs and symptoms of potential                            delayed complications were discussed with the                            patient. Return to normal activities tomorrow.  Written discharge instructions were provided to the                            patient.                           - Resume previous diet.                           - Can reduce Protonix to 40 mg p.o. daily x 2                            weeks, then can try every other day for reflux.                           - The findings and recommendations were discussed                            with the patient's family. Lynann Bologna, MD 11/07/2022 9:29:17 AM This report has been signed electronically.

## 2022-11-07 NOTE — Patient Instructions (Signed)
Can reduce Protonix to 40 mg daily for two weeks, then can try every other day.      YOU HAD AN ENDOSCOPIC PROCEDURE TODAY AT THE [facility]:   Refer to the procedure report that was given to you for any specific questions about what was found during the examination.  If the procedure report does not answer your questions, please call your gastroenterologist to clarify.  If you requested that your care partner not be given the details of your procedure findings, then the procedure report has been included in a sealed envelope for you to review at your convenience later.  YOU SHOULD EXPECT: Some feelings of bloating in the abdomen. Passage of more gas than usual.  Walking can help get rid of the air that was put into your GI tract during the procedure and reduce the bloating. If you had a lower endoscopy (such as a colonoscopy or flexible sigmoidoscopy) you may notice spotting of blood in your stool or on the toilet paper. If you underwent a bowel prep for your procedure, you may not have a normal bowel movement for a few days.  Please Note:  You might notice some irritation and congestion in your nose or some drainage.  This is from the oxygen used during your procedure.  There is no need for concern and it should clear up in a day or so.  SYMPTOMS TO REPORT IMMEDIATELY:   Following upper endoscopy (EGD)  Vomiting of blood or coffee ground material  New chest pain or pain under the shoulder blades  Painful or persistently difficult swallowing  New shortness of breath  Fever of 100F or higher  Black, tarry-looking stools  For urgent or emergent issues, a gastroenterologist can be reached at any hour by calling [phone]. Do not use MyChart messaging for urgent concerns.    DIET:  We do recommend a small meal at first, but then you may proceed to your regular diet.  Drink plenty of fluids but you should avoid alcoholic beverages for 24 hours.  ACTIVITY:  You should plan  to take it easy for the rest of today and you should NOT DRIVE or use heavy machinery until tomorrow (because of the sedation medicines used during the test).    FOLLOW UP: Our staff will call the number listed on your records the next business day following your procedure.  We will call around 7:15- 8:00 am to check on you and address any questions or concerns that you may have regarding the information given to you following your procedure. If we do not reach you, we will leave a message.     If any biopsies were taken you will be contacted by phone or by letter within the next 1-3 weeks.  Please call us at [phone] if you have not heard about the biopsies in 3 weeks.    SIGNATURES/CONFIDENTIALITY: You and/or your care partner have signed paperwork which will be entered into your electronic medical record.  These signatures attest to the fact that that the information above on your After Visit Summary has been reviewed and is understood.  Full responsibility of the confidentiality of this discharge information lies with you and/or your care-partner.

## 2022-11-07 NOTE — Progress Notes (Signed)
Vss nad trans to pacu 

## 2022-11-07 NOTE — Progress Notes (Signed)
[facility] History and Physical   Primary Care Physician:  Wanda Plump, MD   Reason for Procedure:   FU GU  Plan:    EGD/protonix     HPI: Shawn Walter is a 52 y.o. male    Past Medical History:  Diagnosis Date   Diabetes mellitus    on meds   GERD (gastroesophageal reflux disease)    Hyperlipidemia    on meds   Normal cardiac stress test 02/10/2011   Renal cyst, right    Complex, Dr. Marlou Porch   Urolithiasis 11/09/2013   L    Past Surgical History:  Procedure Laterality Date   COLONOSCOPY     INCISE AND DRAIN ABCESS  07/11/2011   perirectal abscess    UPPER GASTROINTESTINAL ENDOSCOPY      Prior to Admission medications   Medication Sig Start Date End Date Taking? Authorizing Provider  Dapagliflozin Pro-metFORMIN ER (XIGDUO XR) 09-998 MG TB24 Take 1 tablet by mouth daily. 07/10/22  Yes Reather Littler, MD  pantoprazole (PROTONIX) 40 MG tablet Take 1 tablet (40 mg total) by mouth 2 (two) times daily. 07/22/22  Yes Paz, Nolon Rod, MD  rosuvastatin (CRESTOR) 20 MG tablet TAKE 1 TABLET BY MOUTH EVERY DAY 09/04/22  Yes Reather Littler, MD  sucralfate (CARAFATE) 1 g tablet Take 1 tablet (1 g total) by mouth 4 (four) times daily. 08/06/22  Yes Lynann Bologna, MD  glucose blood (ONETOUCH VERIO) test strip Use to check blood sugar once a day 12/13/21   Reather Littler, MD  OneTouch Delica Lancets 33G MISC Use to check blood sugar once daily 12/13/21   Reather Littler, MD    Current Outpatient Medications  Medication Sig Dispense Refill   Dapagliflozin Pro-metFORMIN ER (XIGDUO XR) 09-998 MG TB24 Take 1 tablet by mouth daily. 90 tablet 1   pantoprazole (PROTONIX) 40 MG tablet Take 1 tablet (40 mg total) by mouth 2 (two) times daily. 60 tablet 3   rosuvastatin (CRESTOR) 20 MG tablet TAKE 1 TABLET BY MOUTH EVERY DAY 90 tablet 1   sucralfate (CARAFATE) 1 g tablet Take 1 tablet (1 g total) by mouth 4 (four) times daily. 120 tablet 1   glucose blood (ONETOUCH VERIO) test strip Use to check  blood sugar once a day 100 each 3   OneTouch Delica Lancets 33G MISC Use to check blood sugar once daily 100 each 3   Current Facility-Administered Medications  Medication Dose Route Frequency Provider Last Rate Last Admin   0.9 %  sodium chloride infusion  500 mL Intravenous Once Lynann Bologna, MD        Allergies as of 11/07/2022   (No Known Allergies)    Family History  Problem Relation Age of Onset   Breast cancer Mother 42   Sudden death Brother 85       MI ?  Passed away on his sleep   Hypertension Neg Hx    Hyperlipidemia Neg Hx    Heart attack Neg Hx    Diabetes Neg Hx    Colon cancer Neg Hx    Prostate cancer Neg Hx    Colon polyps Neg Hx    Rectal cancer Neg Hx    Stomach cancer Neg Hx     Social History   Socioeconomic History   Marital status: Married    Spouse name: Not on file   Number of children: 2   Years of education: Not on file   Highest education level: Not on file  Occupational History   Occupation: IT   Tobacco Use   Smoking status: Former    Types: Cigarettes    Quit date: 05/13/1999    Years since quitting: 23.5   Smokeless tobacco: Never  Vaping Use   Vaping Use: Never used  Substance and Sexual Activity   Alcohol use: Yes    Comment: 0-3 per month   Drug use: No   Sexual activity: Not on file  Other Topics Concern   Not on file  Social History Narrative   Married, born in [place]     children x 2     daughter 2004 : [place], Art gallery manager    son 2010)    Social Determinants of Health   Financial Resource Strain: Not on file  Food Insecurity: Not on file  Transportation Needs: Not on file  Physical Activity: Not on file  Stress: Not on file  Social Connections: Not on file  Intimate Partner Violence: Not on file    Review of Systems: Positive for none All other review of systems negative except as mentioned in the HPI.  Physical Exam: Vital signs in last 24 hours: @VSRANGES @   General:   Alert,  Well-developed,  well-nourished, pleasant and cooperative in NAD Lungs:  Clear throughout to auscultation.   Heart:  Regular rate and rhythm; no murmurs, clicks, rubs,  or gallops. Abdomen:  Soft, nontender and nondistended. Normal bowel sounds.   Neuro/Psych:  Alert and cooperative. Normal mood and affect. A and O x 3    No significant changes were identified.  The patient continues to be an appropriate candidate for the planned procedure and anesthesia.   Edman Circle, MD. [facility] 11/07/2022 9:15 AM@

## 2022-11-10 ENCOUNTER — Telehealth: Payer: Self-pay

## 2022-11-10 NOTE — Telephone Encounter (Signed)
No answer, left message to call if having any issues or concerns, B.Elayne Gruver RN 

## 2022-11-11 ENCOUNTER — Other Ambulatory Visit: Payer: Self-pay | Admitting: Internal Medicine

## 2023-03-19 ENCOUNTER — Encounter: Payer: Self-pay | Admitting: Internal Medicine

## 2023-06-18 ENCOUNTER — Other Ambulatory Visit: Payer: Self-pay | Admitting: Endocrinology

## 2023-06-18 ENCOUNTER — Other Ambulatory Visit: Payer: Self-pay

## 2023-06-18 DIAGNOSIS — E785 Hyperlipidemia, unspecified: Secondary | ICD-10-CM

## 2023-06-18 DIAGNOSIS — E1165 Type 2 diabetes mellitus with hyperglycemia: Secondary | ICD-10-CM

## 2023-06-22 ENCOUNTER — Other Ambulatory Visit: Payer: Managed Care, Other (non HMO)

## 2023-06-22 LAB — MICROALBUMIN / CREATININE URINE RATIO
Creatinine, Urine: 190 mg/dL (ref 20–320)
Microalb Creat Ratio: 24 mg/g{creat} (ref <30)
Microalb, Ur: 4.5 mg/dL

## 2023-06-22 LAB — BASIC METABOLIC PANEL WITH GFR
BUN: 18 mg/dL (ref 7–25)
CO2: 29 mmol/L (ref 20–32)
Calcium: 9.6 mg/dL (ref 8.6–10.3)
Chloride: 101 mmol/L (ref 98–110)
Creat: 0.97 mg/dL (ref 0.70–1.30)
Glucose, Bld: 257 mg/dL — ABNORMAL HIGH (ref 65–99)
Potassium: 4.8 mmol/L (ref 3.5–5.3)
Sodium: 137 mmol/L (ref 135–146)
eGFR: 94 mL/min/{1.73_m2} (ref >=60)

## 2023-06-22 LAB — LIPID PANEL
Cholesterol: 230 mg/dL — ABNORMAL HIGH (ref <200)
HDL: 53 mg/dL (ref >=40)
LDL Cholesterol (Calc): 151 mg/dL — ABNORMAL HIGH
Non-HDL Cholesterol (Calc): 177 mg/dL — ABNORMAL HIGH (ref <130)
Total CHOL/HDL Ratio: 4.3 (calc) (ref <5.0)
Triglycerides: 136 mg/dL (ref <150)

## 2023-06-22 LAB — HEMOGLOBIN A1C
Hgb A1c MFr Bld: 10.7 %{Hb} — ABNORMAL HIGH (ref <5.7)
Mean Plasma Glucose: 260 mg/dL
eAG (mmol/L): 14.4 mmol/L

## 2023-06-23 ENCOUNTER — Encounter: Payer: Self-pay | Admitting: Endocrinology

## 2023-06-25 ENCOUNTER — Ambulatory Visit (INDEPENDENT_AMBULATORY_CARE_PROVIDER_SITE_OTHER): Payer: Managed Care, Other (non HMO) | Admitting: Endocrinology

## 2023-06-25 ENCOUNTER — Encounter: Payer: Self-pay | Admitting: Endocrinology

## 2023-06-25 VITALS — BP 110/60 | HR 75 | Resp 20 | Ht 65.0 in | Wt 165.4 lb

## 2023-06-25 DIAGNOSIS — Z7984 Long term (current) use of oral hypoglycemic drugs: Secondary | ICD-10-CM | POA: Diagnosis not present

## 2023-06-25 DIAGNOSIS — E785 Hyperlipidemia, unspecified: Secondary | ICD-10-CM

## 2023-06-25 DIAGNOSIS — E1165 Type 2 diabetes mellitus with hyperglycemia: Secondary | ICD-10-CM | POA: Diagnosis not present

## 2023-06-25 MED ORDER — ROSUVASTATIN CALCIUM 20 MG PO TABS: 20.0000 mg | ORAL_TABLET | Freq: Every day | ORAL | 3 refills | Status: DC

## 2023-06-25 MED ORDER — ONETOUCH DELICA LANCETS 33G MISC
3 refills | Status: AC
Start: 2023-06-25 — End: ?

## 2023-06-25 MED ORDER — DAPAGLIFLOZIN PRO-METFORMIN ER 5-1000 MG PO TB24
1.0000 | ORAL_TABLET | Freq: Two times a day (BID) | ORAL | 3 refills | Status: AC
Start: 2023-06-25 — End: ?

## 2023-06-25 MED ORDER — ONETOUCH VERIO VI STRP: ORAL_STRIP | 3 refills | Status: AC

## 2023-06-25 NOTE — Progress Notes (Signed)
Outpatient Endocrinology Note [name], MD   Patient's Name: Shawn Walter    DOB: 01-Nov-1970    MRN: [id number]                                                    REASON OF VISIT: Follow up for type 2 diabetes mellitus  PCP: Wanda Plump, MD  HISTORY OF PRESENT ILLNESS:   Shawn Walter is a 53 y.o. old male with past medical history listed below, is here for follow up for type 2 diabetes mellitus.   Pertinent Diabetes History: Patient was previously seen by Dr. Lucianne Muss and was last time seen in January 2024.  Patient was diagnosed with type 2 diabetes mellitus in 2004.  Patient has variable control of type 2 diabetes mellitus.  He had hemoglobin A1c as high as 10% in the past and some of the times in the range of 6 to 7% range.  Chronic Diabetes Complications : Retinopathy: no. Last ophthalmology exam was done on annually, following with ophthalmology regularly.  Nephropathy: no Peripheral neuropathy: on Coronary artery disease: no Stroke: on  Relevant comorbidities and cardiovascular risk factors: Obesity: no Body mass index is 27.52 kg/m.  Hypertension: no Hyperlipidemia : Yes, on statin  Has a positive history of sudden death in his family with his brother dying at the age of 85.    Current / Home Diabetic regimen includes:  Xigduo extended release 09-998 mg 1 tablet daily.  Prior diabetic medications: Janumet, switch to Bertram.  Marcelline Deist, pioglitazone/Actos in the past.  Glycemic data:   No glucose data to review.  He has not been checking blood sugar lately at home.  Hypoglycemia: Patient has no hypoglycemic episodes. Patient has hypoglycemia awareness.  Factors modifying glucose control: 1.  Diabetic diet assessment: 3 meals a day.  2.  Staying active or exercising: No formal exercise at this time.  3.  Medication compliance: compliant all of the time.  Interval history  Diabetes regimen as reviewed and noted above.  He reports compliance with taking  Xigduo and taking 1 tablet daily.  Hemoglobin A1c worsened to 10.7% from 6.5% in January 2024.  Patient reports he he had high carbohydrate meals especially during the holiday time.  He has also not been exercising due to weather.  Denies numbness and ting of the feet.  No vision problem.  He also reports he is not fully compliant with taking rosuvastatin.  Laboratory results reviewed, normal renal function with normal microalbumin and creatinine ratio.  He has elevated lipids level with LDL 151.  Lately he has not been checking blood sugar, he reports he ran out of the test strips.  REVIEW OF SYSTEMS As per history of present illness.   PAST MEDICAL HISTORY: Past Medical History:  Diagnosis Date   Diabetes mellitus    on meds   GERD (gastroesophageal reflux disease)    Hyperlipidemia    on meds   Normal cardiac stress test 02/10/2011   Renal cyst, right    Complex, Dr. Marlou Porch   Urolithiasis 11/09/2013   L    PAST SURGICAL HISTORY: Past Surgical History:  Procedure Laterality Date   COLONOSCOPY     INCISE AND DRAIN ABCESS  07/11/2011   perirectal abscess    UPPER GASTROINTESTINAL ENDOSCOPY      ALLERGIES: No Known  Allergies  FAMILY HISTORY:  Family History  Problem Relation Age of Onset   Breast cancer Mother 64   Sudden death Brother 65       MI ?  Passed away on his sleep   Hypertension Neg Hx    Hyperlipidemia Neg Hx    Heart attack Neg Hx    Diabetes Neg Hx    Colon cancer Neg Hx    Prostate cancer Neg Hx    Colon polyps Neg Hx    Rectal cancer Neg Hx    Stomach cancer Neg Hx     SOCIAL HISTORY: Social History   Socioeconomic History   Marital status: Married    Spouse name: Not on file   Number of children: 2   Years of education: Not on file   Highest education level: Not on file  Occupational History   Occupation: IT   Tobacco Use   Smoking status: Former    Current packs/day: 0.00    Types: Cigarettes    Quit date: 05/13/1999    Years since  quitting: 24.1   Smokeless tobacco: Never  Vaping Use   Vaping status: Never Used  Substance and Sexual Activity   Alcohol use: Yes    Comment: 0-3 per month   Drug use: No   Sexual activity: Not on file  Other Topics Concern   Not on file  Social History Narrative   Married, born in [place]     children x 2     daughter 2004 : Raymond, Art gallery manager    son 2010)    Social Drivers of Corporate investment banker Strain: Not on file  Food Insecurity: Not on file  Transportation Needs: Not on file  Physical Activity: Not on file  Stress: Not on file  Social Connections: Not on file    MEDICATIONS:  Current Outpatient Medications  Medication Sig Dispense Refill   pantoprazole (PROTONIX) 40 MG tablet Take 1 tablet (40 mg total) by mouth 2 (two) times daily. 180 tablet 1   sucralfate (CARAFATE) 1 g tablet Take 1 tablet (1 g total) by mouth 4 (four) times daily. 120 tablet 1   Dapagliflozin Pro-metFORMIN ER (XIGDUO XR) 09-998 MG TB24 Take 1 tablet by mouth in the morning and at bedtime. 180 tablet 3   glucose blood (ONETOUCH VERIO) test strip Use to check blood sugar once a day 100 each 3   OneTouch Delica Lancets 33G MISC Use to check blood sugar once daily 100 each 3   rosuvastatin (CRESTOR) 20 MG tablet Take 1 tablet (20 mg total) by mouth daily. 90 tablet 3   No current facility-administered medications for this visit.    PHYSICAL EXAM: Vitals:   06/25/23 0902  BP: 110/60  Pulse: 75  Resp: 20  SpO2: 98%  Weight: 165 lb 6.4 oz (75 kg)  Height: 5\' 5"  (1.651 m)   Body mass index is 27.52 kg/m.  Wt Readings from Last 3 Encounters:  06/25/23 165 lb 6.4 oz (75 kg)  11/07/22 175 lb (79.4 kg)  09/29/22 175 lb (79.4 kg)    General: Well developed, well nourished male in no apparent distress.  HEENT: AT/, no external lesions.  Eyes: Conjunctiva clear and no icterus. Neck: Neck supple  Lungs: Respirations not labored Neurologic: Alert, oriented, normal speech Extremities  / Skin: Dry. No sores or rashes noted.  Psychiatric: Does not appear depressed or anxious  Diabetic Foot Exam - Simple   Simple Foot Form Diabetic Foot  exam was performed with the following findings: Yes 06/25/2023  9:22 AM  Visual Inspection No deformities, no ulcerations, no other skin breakdown bilaterally: Yes Sensation Testing Intact to touch and monofilament testing bilaterally: Yes Pulse Check Posterior Tibialis and Dorsalis pulse intact bilaterally: Yes Comments    LABS Reviewed Lab Results  Component Value Date   HGBA1C 10.7 (H) 06/22/2023   HGBA1C 6.5 05/13/2022   HGBA1C 7.0 (H) 12/09/2021   Lab Results  Component Value Date   FRUCTOSAMINE 302 (H) 12/13/2018   FRUCTOSAMINE 269 02/17/2013   Lab Results  Component Value Date   CHOL 230 (H) 06/22/2023   HDL 53 06/22/2023   LDLCALC 151 (H) 06/22/2023   LDLDIRECT 178.1 04/20/2013   TRIG 136 06/22/2023   CHOLHDL 4.3 06/22/2023   Lab Results  Component Value Date   MICRALBCREAT 24 06/22/2023   MICRALBCREAT 1.2 12/09/2021   Lab Results  Component Value Date   CREATININE 0.97 06/22/2023   Lab Results  Component Value Date   GFR 102.06 07/22/2022    ASSESSMENT / PLAN  1. Uncontrolled type 2 diabetes mellitus with hyperglycemia, without long-term current use of insulin (HCC)   2. Hyperlipidemia with target LDL less than 100     Diabetes Mellitus type 2, complicated by no known complications. - Diabetic status / severity: Uncontrolled, worsening.  Lab Results  Component Value Date   HGBA1C 10.7 (H) 06/22/2023    - Hemoglobin A1c goal : <6.5%  Discussed in detail about importance of controlling diabetes to prevent chronic complications including diabetic retinopathy, neuropathy and nephropathy.  Discussed in detail about diet control, portion control, limiting calories and carbohydrates.  Advised to start regular exercise.  He is committed to restart diet plan and exercise.  Adjusted diabetes regimen  with increasing dose without adding another agent.  - Medications: See below.  I) increase Xigduo 09-998 mg 1 tablet daily to 2 times a day.  - Home glucose testing: Advised to check in the morning fasting daily and asked to bring glucometer in the clinic visit.  Test supplies orders. - Discussed/ Gave Hypoglycemia treatment plan.  # Consult : not required at this time.   # Annual urine for microalbuminuria/ creatinine ratio, no microalbuminuria currently. Last  Lab Results  Component Value Date   MICRALBCREAT 24 06/22/2023    # Foot check nightly.  # Annual dilated diabetic eye exams.   - Diet: Make healthy diabetic food choices, discussed in detail. - Life style / activity / exercise: Discussed.  2. Blood pressure  -  BP Readings from Last 1 Encounters:  06/25/23 110/60    - Control is in target.  - No change in current plans.  3. Lipid status / Hyperlipidemia - Last  Lab Results  Component Value Date   LDLCALC 151 (H) 06/22/2023   - Continue rosuvastatin 20 mg daily.  Not fully compliant.  Discussed about compliance.  Diagnoses and all orders for this visit:  Uncontrolled type 2 diabetes mellitus with hyperglycemia, without long-term current use of insulin (HCC) -     Dapagliflozin Pro-metFORMIN ER (XIGDUO XR) 09-998 MG TB24; Take 1 tablet by mouth in the morning and at bedtime. -     OneTouch Delica Lancets 33G MISC; Use to check blood sugar once daily -     glucose blood (ONETOUCH VERIO) test strip; Use to check blood sugar once a day -     BASIC METABOLIC PANEL WITH GFR -     Hemoglobin A1c  Hyperlipidemia  with target LDL less than 100 -     rosuvastatin (CRESTOR) 20 MG tablet; Take 1 tablet (20 mg total) by mouth daily. -     Lipid panel    DISPOSITION Follow up in clinic in 4  months suggested.  Labs prior to follow-up visit.   All questions answered and patient verbalized understanding of the plan.  [place] Genna Casimir, MD [facility] Endocrinology [facility] [address] [place], [address] [place], [place] [address] Phone # [phone]  At least part of this note was generated using voice recognition software. Inadvertent word errors may have occurred, which were not recognized during the proofreading process.

## 2023-08-19 ENCOUNTER — Encounter: Payer: Self-pay | Admitting: Internal Medicine

## 2023-09-01 ENCOUNTER — Other Ambulatory Visit: Payer: Self-pay

## 2023-09-01 DIAGNOSIS — E785 Hyperlipidemia, unspecified: Secondary | ICD-10-CM

## 2023-09-01 MED ORDER — ROSUVASTATIN CALCIUM 20 MG PO TABS
20.0000 mg | ORAL_TABLET | Freq: Every day | ORAL | 3 refills | Status: AC
Start: 2023-09-01 — End: ?

## 2023-10-21 ENCOUNTER — Other Ambulatory Visit: Payer: Self-pay

## 2023-10-27 ENCOUNTER — Other Ambulatory Visit: Payer: Managed Care, Other (non HMO)

## 2023-10-29 ENCOUNTER — Ambulatory Visit: Payer: Managed Care, Other (non HMO) | Admitting: Endocrinology

## 2024-01-22 ENCOUNTER — Other Ambulatory Visit

## 2024-01-25 ENCOUNTER — Other Ambulatory Visit

## 2024-01-25 DIAGNOSIS — E785 Hyperlipidemia, unspecified: Secondary | ICD-10-CM | POA: Diagnosis not present

## 2024-01-26 ENCOUNTER — Ambulatory Visit: Payer: Self-pay | Admitting: Endocrinology

## 2024-01-26 DIAGNOSIS — E1165 Type 2 diabetes mellitus with hyperglycemia: Secondary | ICD-10-CM

## 2024-01-27 ENCOUNTER — Encounter: Payer: Self-pay | Admitting: Endocrinology

## 2024-01-27 ENCOUNTER — Ambulatory Visit: Admitting: Endocrinology

## 2024-01-27 VITALS — BP 108/70 | HR 74 | Resp 20 | Ht 65.0 in | Wt 161.8 lb

## 2024-01-27 DIAGNOSIS — E1165 Type 2 diabetes mellitus with hyperglycemia: Secondary | ICD-10-CM | POA: Diagnosis not present

## 2024-01-27 LAB — BASIC METABOLIC PANEL WITH GFR
BUN: 14 mg/dL (ref 7–25)
CO2: 27 mmol/L (ref 20–32)
Calcium: 9.3 mg/dL (ref 8.6–10.3)
Chloride: 105 mmol/L (ref 98–110)
Creat: 0.82 mg/dL (ref 0.70–1.30)
Glucose, Bld: 139 mg/dL — ABNORMAL HIGH (ref 65–99)
Potassium: 5.1 mmol/L (ref 3.5–5.3)
Sodium: 138 mmol/L (ref 135–146)
eGFR: 105 mL/min/1.73m2 (ref >=60)

## 2024-01-27 LAB — LIPID PANEL
Cholesterol: 160 mg/dL (ref <200)
HDL: 49 mg/dL (ref >=40)
LDL Cholesterol (Calc): 86 mg/dL
Non-HDL Cholesterol (Calc): 111 mg/dL (ref <130)
Total CHOL/HDL Ratio: 3.3 (calc) (ref <5.0)
Triglycerides: 158 mg/dL — ABNORMAL HIGH (ref <150)

## 2024-01-27 LAB — HEMOGLOBIN A1C
Hgb A1c MFr Bld: 9.8 % — ABNORMAL HIGH (ref <5.7)
Mean Plasma Glucose: 235 mg/dL
eAG (mmol/L): 13 mmol/L

## 2024-01-27 NOTE — Progress Notes (Signed)
 Outpatient Endocrinology Note [place] Jarissa Sheriff, MD   Patient's Name: Shawn Walter    DOB: 29-Apr-1971    MRN: [id number]                                                    REASON OF VISIT: Follow up for type 2 diabetes mellitus  PCP: Amon Aloysius BRAVO, MD  HISTORY OF PRESENT ILLNESS:   Shawn Walter is a 53 y.o. old male with past medical history listed below, is here for follow up for type 2 diabetes mellitus.   Pertinent Diabetes History: Patient was previously seen by Dr. Von and was last time seen in January 2024.  Patient was diagnosed with type 2 diabetes mellitus in 2004.  Patient has variable control of type 2 diabetes mellitus.  He had hemoglobin A1c as high as 10% in the past and some of the times in the range of 6 to 7% range.  Chronic Diabetes Complications : Retinopathy: no. Last ophthalmology exam was done on annually, following with ophthalmology regularly.  Nephropathy: no Peripheral neuropathy: on Coronary artery disease: no Stroke: on  Relevant comorbidities and cardiovascular risk factors: Obesity: no Body mass index is 26.92 kg/m.  Hypertension: no Hyperlipidemia : Yes, on statin  Has a positive history of sudden death in his family with his brother dying at the age of 29.    Current / Home Diabetic regimen includes:  Xigduo  extended release 09-998 mg 2 tablet daily.  Prior diabetic medications: Janumet , switch to Xigduo .  Farxiga , pioglitazone /Actos  in the past.  Glycemic data:   No glucose data to review.  He has not been checking blood sugar lately at home.  Hypoglycemia: Patient has no hypoglycemic episodes. Patient has hypoglycemia awareness.  Factors modifying glucose control: 1.  Diabetic diet assessment: 3 meals a day.  2.  Staying active or exercising: No formal exercise at this time.  3.  Medication compliance: compliant all of the time.  Interval history  Patient laboratory results reviewed.  Normal renal function/creatinine.  Normal  electrolytes.  Lipids level acceptable with LDL improved from 151 to 86.  He has been taking Xigduo  2 tablets daily.  Tolerating well, denies any GI issues.  No numbness and tingling of the feet.  No vision problem.  He has lost about 15 pounds of weight in last 1 year.  He reports he has been working on better diet and exercise.  He collected lab on 9/15 however hemoglobin A1c is still pending.  Talked with lab mentioned that sample is still being processed and may take 3 days of turnaround time.  REVIEW OF SYSTEMS As per history of present illness.   PAST MEDICAL HISTORY: Past Medical History:  Diagnosis Date   Diabetes mellitus    on meds   GERD (gastroesophageal reflux disease)    Hyperlipidemia    on meds   Normal cardiac stress test 02/10/2011   Renal cyst, right    Complex, Dr. Cam   Urolithiasis 11/09/2013   L    PAST SURGICAL HISTORY: Past Surgical History:  Procedure Laterality Date   COLONOSCOPY     INCISE AND DRAIN ABCESS  07/11/2011   perirectal abscess    UPPER GASTROINTESTINAL ENDOSCOPY      ALLERGIES: No Known Allergies  FAMILY HISTORY:  Family History  Problem Relation Age of  Onset   Breast cancer Mother 71   Sudden death Brother 39       MI ?  Passed away on his sleep   Hypertension Neg Hx    Hyperlipidemia Neg Hx    Heart attack Neg Hx    Diabetes Neg Hx    Colon cancer Neg Hx    Prostate cancer Neg Hx    Colon polyps Neg Hx    Rectal cancer Neg Hx    Stomach cancer Neg Hx     SOCIAL HISTORY: Social History   Socioeconomic History   Marital status: Married    Spouse name: Not on file   Number of children: 2   Years of education: Not on file   Highest education level: Not on file  Occupational History   Occupation: IT   Tobacco Use   Smoking status: Former    Current packs/day: 0.00    Types: Cigarettes    Quit date: 05/13/1999    Years since quitting: 24.7   Smokeless tobacco: Never  Vaping Use   Vaping status: Never Used   Substance and Sexual Activity   Alcohol use: Yes    Comment: 0-3 per month   Drug use: No   Sexual activity: Not on file  Other Topics Concern   Not on file  Social History Narrative   Married, born in [place]     children x 2     daughter 2004 : Burgin, Art gallery manager    son 2010)    Social Drivers of Corporate investment banker Strain: Not on file  Food Insecurity: Not on file  Transportation Needs: Not on file  Physical Activity: Not on file  Stress: Not on file  Social Connections: Not on file    MEDICATIONS:  Current Outpatient Medications  Medication Sig Dispense Refill   Dapagliflozin  Pro-metFORMIN  ER (XIGDUO  XR) 09-998 MG TB24 Take 1 tablet by mouth in the morning and at bedtime. 180 tablet 3   glucose blood (ONETOUCH VERIO) test strip Use to check blood sugar once a day 100 each 3   OneTouch Delica Lancets 33G MISC Use to check blood sugar once daily 100 each 3   rosuvastatin  (CRESTOR ) 20 MG tablet Take 1 tablet (20 mg total) by mouth daily. 90 tablet 3   No current facility-administered medications for this visit.    PHYSICAL EXAM: Vitals:   01/27/24 0829  BP: 108/70  Pulse: 74  Resp: 20  SpO2: 98%  Weight: 161 lb 12.8 oz (73.4 kg)  Height: 5' 5 (1.651 m)   Body mass index is 26.92 kg/m.  Wt Readings from Last 3 Encounters:  01/27/24 161 lb 12.8 oz (73.4 kg)  06/25/23 165 lb 6.4 oz (75 kg)  11/07/22 175 lb (79.4 kg)    General: Well developed, well nourished male in no apparent distress.  HEENT: AT/[place], no external lesions.  Eyes: Conjunctiva clear and no icterus. Neck: Neck supple  Lungs: Respirations not labored Neurologic: Alert, oriented, normal speech Extremities / Skin: Dry.  Psychiatric: Does not appear depressed or anxious  Diabetic Foot Exam - Simple   No data filed    LABS Reviewed Lab Results  Component Value Date   HGBA1C 10.7 (H) 06/22/2023   HGBA1C 6.5 05/13/2022   HGBA1C 7.0 (H) 12/09/2021   Lab Results  Component Value Date    FRUCTOSAMINE 302 (H) 12/13/2018   FRUCTOSAMINE 269 02/17/2013   Lab Results  Component Value Date   CHOL 160 01/25/2024  HDL 49 01/25/2024   LDLCALC 86 01/25/2024   LDLDIRECT 178.1 04/20/2013   TRIG 158 (H) 01/25/2024   CHOLHDL 3.3 01/25/2024   Lab Results  Component Value Date   MICRALBCREAT 24 06/22/2023   MICRALBCREAT 1.9 11/08/2009   Lab Results  Component Value Date   CREATININE 0.82 01/25/2024   Lab Results  Component Value Date   GFR 102.06 07/22/2022    ASSESSMENT / PLAN  1. Uncontrolled type 2 diabetes mellitus with hyperglycemia, without long-term current use of insulin (HCC)     Diabetes Mellitus type 2, complicated by no other  known complications. - Diabetic status / severity: Uncontrolled, worsening.  Lab Results  Component Value Date   HGBA1C 10.7 (H) 06/22/2023    - Hemoglobin A1c goal : <6.5%  No hemoglobin A1c result available today he collected lab on September 15.  Follow-up result of hemoglobin A1c.  Talked with lab may take 3 days of turnaround time.  No glucose data to review.  - Medications: See below.  I) continue Xigduo  09-998 mg 2 tablet daily.  Adjust the antidiabetic medication after the result of hemoglobin A1c.  No glucose data available to review.  He prefer not to add any more medication.  Discussed about option of GLP-1 receptor agonist.  - Home glucose testing: Advised to check in the morning fasting daily and asked to bring glucometer in the clinic visit.    - Discussed/ Gave Hypoglycemia treatment plan.  # Consult : not required at this time.   # Annual urine for microalbuminuria/ creatinine ratio, no microalbuminuria currently. Last  Lab Results  Component Value Date   MICRALBCREAT 24 06/22/2023    # Foot check nightly.  # Annual dilated diabetic eye exams.   - Diet: Make healthy diabetic food choices, discussed in detail. - Life style / activity / exercise: Discussed.  2. Blood pressure  -  BP Readings  from Last 1 Encounters:  01/27/24 108/70    - Control is in target.  - No change in current plans.  3. Lipid status / Hyperlipidemia - Last  Lab Results  Component Value Date   LDLCALC 86 01/25/2024   - Continue rosuvastatin  20 mg daily.  Improvement of LDL recently.  Diagnoses and all orders for this visit:  Uncontrolled type 2 diabetes mellitus with hyperglycemia, without long-term current use of insulin (HCC) -     Basic metabolic panel with GFR -     Microalbumin / creatinine urine ratio -     Hemoglobin A1c    DISPOSITION Follow up in clinic in 4  months suggested.  Labs prior to follow-up visit.  He prefers to do lab prior to follow-up visit.   All questions answered and patient verbalized understanding of the plan.  [place] Taiyo Kozma, MD [facility] Endocrinology [facility] [address] [place], [address] [place], [place] [address] Phone # [phone]  At least part of this note was generated using voice recognition software. Inadvertent word errors may have occurred, which were not recognized during the proofreading process.

## 2024-01-28 ENCOUNTER — Other Ambulatory Visit: Payer: Self-pay | Admitting: Endocrinology

## 2024-01-28 DIAGNOSIS — E1165 Type 2 diabetes mellitus with hyperglycemia: Secondary | ICD-10-CM

## 2024-01-28 MED ORDER — SITAGLIPTIN PHOSPHATE 100 MG PO TABS: 100.0000 mg | ORAL_TABLET | Freq: Every day | ORAL | 4 refills | Status: DC

## 2024-03-04 DIAGNOSIS — H6692 Otitis media, unspecified, left ear: Secondary | ICD-10-CM | POA: Diagnosis not present

## 2024-03-04 DIAGNOSIS — J029 Acute pharyngitis, unspecified: Secondary | ICD-10-CM | POA: Diagnosis not present

## 2024-05-03 ENCOUNTER — Telehealth: Payer: Self-pay

## 2024-05-03 MED ORDER — OSELTAMIVIR PHOSPHATE 75 MG PO CAPS: 75.0000 mg | ORAL_CAPSULE | Freq: Every day | ORAL | 0 refills | Status: AC

## 2024-05-03 NOTE — Telephone Encounter (Signed)
 Spoke w/ Pt- informed of PCP recommendations. Rx sent. CPX appt scheduled.

## 2024-05-03 NOTE — Telephone Encounter (Signed)
 Please advise- last OV 07/2022. No upcoming appt scheduled.

## 2024-05-03 NOTE — Telephone Encounter (Signed)
 Copied from CRM [id number]. Topic: Clinical - Medical Advice >> May 03, 2024 12:49 PM Chasity T wrote: Reason for CRM: patient is calling requesting for medication for the flu for precaution due to his household having the flu currently. Please contact him to discuss if something can be sent or not.  Phone number:  [phone]

## 2024-05-03 NOTE — Telephone Encounter (Signed)
 Advised patient: Send a prescription for Tamiflu  75 mg 1 tablet daily, 1 week supply. If he has not gotten the vaccine, recommend to do so Seek medical attention if fever chills.

## 2024-05-27 ENCOUNTER — Other Ambulatory Visit

## 2024-05-30 ENCOUNTER — Ambulatory Visit: Admitting: Endocrinology

## 2024-06-16 ENCOUNTER — Other Ambulatory Visit: Payer: Self-pay

## 2024-06-30 ENCOUNTER — Ambulatory Visit: Admitting: Endocrinology

## 2024-07-04 ENCOUNTER — Other Ambulatory Visit

## 2024-07-07 ENCOUNTER — Ambulatory Visit: Admitting: Endocrinology

## 2024-07-18 ENCOUNTER — Encounter: Admitting: Internal Medicine
